# Patient Record
Sex: Female | Born: 1955 | Race: White | Hispanic: No | Marital: Married | Smoking: Former smoker
Health system: Southern US, Community
[De-identification: ages and names within clinical notes are randomized; demographics above are authoritative.]

## PROBLEM LIST (undated history)

## (undated) DIAGNOSIS — H269 Unspecified cataract: Secondary | ICD-10-CM

## (undated) DIAGNOSIS — T7840XA Allergy, unspecified, initial encounter: Secondary | ICD-10-CM

## (undated) DIAGNOSIS — M1990 Unspecified osteoarthritis, unspecified site: Secondary | ICD-10-CM

## (undated) DIAGNOSIS — R51 Headache: Secondary | ICD-10-CM

## (undated) DIAGNOSIS — I10 Essential (primary) hypertension: Secondary | ICD-10-CM

## (undated) DIAGNOSIS — F419 Anxiety disorder, unspecified: Secondary | ICD-10-CM

## (undated) DIAGNOSIS — E119 Type 2 diabetes mellitus without complications: Secondary | ICD-10-CM

## (undated) DIAGNOSIS — F32A Depression, unspecified: Secondary | ICD-10-CM

## (undated) DIAGNOSIS — G709 Myoneural disorder, unspecified: Secondary | ICD-10-CM

## (undated) DIAGNOSIS — R519 Headache, unspecified: Secondary | ICD-10-CM

## (undated) DIAGNOSIS — N200 Calculus of kidney: Secondary | ICD-10-CM

## (undated) DIAGNOSIS — K219 Gastro-esophageal reflux disease without esophagitis: Secondary | ICD-10-CM

## (undated) HISTORY — PX: EYE SURGERY: SHX253

## (undated) HISTORY — DX: Unspecified cataract: H26.9

## (undated) HISTORY — DX: Allergy, unspecified, initial encounter: T78.40XA

## (undated) HISTORY — DX: Headache: R51

## (undated) HISTORY — DX: Myoneural disorder, unspecified: G70.9

## (undated) HISTORY — DX: Unspecified osteoarthritis, unspecified site: M19.90

## (undated) HISTORY — DX: Type 2 diabetes mellitus without complications: E11.9

## (undated) HISTORY — DX: Headache, unspecified: R51.9

## (undated) HISTORY — DX: Essential (primary) hypertension: I10

## (undated) HISTORY — DX: Anxiety disorder, unspecified: F41.9

## (undated) HISTORY — DX: Depression, unspecified: F32.A

## (undated) HISTORY — DX: Calculus of kidney: N20.0

## (undated) HISTORY — DX: Gastro-esophageal reflux disease without esophagitis: K21.9

---

## 1983-09-27 HISTORY — PX: TONSILLECTOMY AND ADENOIDECTOMY: SHX28

## 1983-09-27 HISTORY — PX: TUBAL LIGATION: SHX77

## 1998-03-13 ENCOUNTER — Ambulatory Visit (HOSPITAL_COMMUNITY): Admission: RE | Admit: 1998-03-13 | Discharge: 1998-03-13 | Payer: Self-pay | Admitting: Internal Medicine

## 1998-12-22 ENCOUNTER — Encounter: Admission: RE | Admit: 1998-12-22 | Discharge: 1999-03-22 | Payer: Self-pay | Admitting: Psychiatry

## 1999-03-01 ENCOUNTER — Other Ambulatory Visit: Admission: RE | Admit: 1999-03-01 | Discharge: 1999-03-01 | Payer: Self-pay | Admitting: Gynecology

## 2003-11-18 ENCOUNTER — Other Ambulatory Visit: Admission: RE | Admit: 2003-11-18 | Discharge: 2003-11-18 | Payer: Self-pay | Admitting: Gynecology

## 2003-11-19 ENCOUNTER — Encounter: Admission: RE | Admit: 2003-11-19 | Discharge: 2003-11-19 | Payer: Self-pay | Admitting: Gynecology

## 2005-01-24 ENCOUNTER — Emergency Department (HOSPITAL_COMMUNITY): Admission: EM | Admit: 2005-01-24 | Discharge: 2005-01-24 | Payer: Self-pay | Admitting: Emergency Medicine

## 2005-02-02 ENCOUNTER — Emergency Department (HOSPITAL_COMMUNITY): Admission: EM | Admit: 2005-02-02 | Discharge: 2005-02-02 | Payer: Self-pay | Admitting: Family Medicine

## 2005-12-27 ENCOUNTER — Other Ambulatory Visit: Admission: RE | Admit: 2005-12-27 | Discharge: 2005-12-27 | Payer: Self-pay | Admitting: Gynecology

## 2006-08-11 ENCOUNTER — Encounter: Admission: RE | Admit: 2006-08-11 | Discharge: 2006-08-11 | Payer: Self-pay | Admitting: Gynecology

## 2006-08-23 ENCOUNTER — Encounter: Admission: RE | Admit: 2006-08-23 | Discharge: 2006-08-23 | Payer: Self-pay | Admitting: Gynecology

## 2010-09-26 LAB — HM COLONOSCOPY: HM Colonoscopy: NORMAL

## 2010-10-17 ENCOUNTER — Encounter: Payer: Self-pay | Admitting: Gynecology

## 2013-02-24 LAB — HM PAP SMEAR: HM Pap smear: NORMAL

## 2013-03-26 LAB — HM MAMMOGRAPHY: HM Mammogram: NORMAL

## 2013-11-19 ENCOUNTER — Ambulatory Visit: Payer: Self-pay | Admitting: Physician Assistant

## 2013-11-22 ENCOUNTER — Encounter: Payer: Self-pay | Admitting: Physician Assistant

## 2013-11-22 ENCOUNTER — Ambulatory Visit (INDEPENDENT_AMBULATORY_CARE_PROVIDER_SITE_OTHER): Payer: Managed Care, Other (non HMO) | Admitting: Physician Assistant

## 2013-11-22 VITALS — BP 170/110 | HR 89 | Temp 98.4°F | Ht 65.13 in | Wt 205.5 lb

## 2013-11-22 DIAGNOSIS — E1165 Type 2 diabetes mellitus with hyperglycemia: Secondary | ICD-10-CM | POA: Insufficient documentation

## 2013-11-22 DIAGNOSIS — E119 Type 2 diabetes mellitus without complications: Secondary | ICD-10-CM

## 2013-11-22 DIAGNOSIS — Z0000 Encounter for general adult medical examination without abnormal findings: Secondary | ICD-10-CM

## 2013-11-22 DIAGNOSIS — Z794 Long term (current) use of insulin: Secondary | ICD-10-CM | POA: Insufficient documentation

## 2013-11-22 DIAGNOSIS — I10 Essential (primary) hypertension: Secondary | ICD-10-CM | POA: Insufficient documentation

## 2013-11-22 DIAGNOSIS — E1159 Type 2 diabetes mellitus with other circulatory complications: Secondary | ICD-10-CM | POA: Insufficient documentation

## 2013-11-22 MED ORDER — LOSARTAN POTASSIUM-HCTZ 100-25 MG PO TABS: 1.0000 | ORAL_TABLET | Freq: Every day | ORAL | Status: DC

## 2013-11-22 NOTE — Progress Notes (Signed)
Pre-visit discussion using our clinic review tool. No additional management support is needed unless otherwise documented below in the visit note.  

## 2013-11-22 NOTE — Patient Instructions (Addendum)
It was great to meet you today [name]!   Labs have been ordered for you, when you report to lab please be fasting.     Health Maintenance, Female A healthy lifestyle and preventative care can promote health and wellness.  Maintain regular health, dental, and eye exams.  Eat a healthy diet. Foods like vegetables, fruits, whole grains, low-fat dairy products, and lean protein foods contain the nutrients you need without too many calories. Decrease your intake of foods high in solid fats, added sugars, and salt. Get information about a proper diet from your caregiver, if necessary.  Regular physical exercise is one of the most important things you can do for your health. Most adults should get at least 150 minutes of moderate-intensity exercise (any activity that increases your heart rate and causes you to sweat) each week. In addition, most adults need muscle-strengthening exercises on 2 or more days a week.   Maintain a healthy weight. The body mass index (BMI) is a screening tool to identify possible weight problems. It provides an estimate of body fat based on height and weight. Your caregiver can help determine your BMI, and can help you achieve or maintain a healthy weight. For adults 20 years and older:  A BMI below 18.5 is considered underweight.  A BMI of 18.5 to 24.9 is normal.  A BMI of 25 to 29.9 is considered overweight.  A BMI of 30 and above is considered obese.  Maintain normal blood lipids and cholesterol by exercising and minimizing your intake of saturated fat. Eat a balanced diet with plenty of fruits and vegetables. Blood tests for lipids and cholesterol should begin at age 29 and be repeated every 5 years. If your lipid or cholesterol levels are high, you are over 50, or you are a high risk for heart disease, you may need your cholesterol levels checked more frequently.Ongoing high lipid and cholesterol levels should be treated with medicines if diet and exercise  are not effective.  If you smoke, find out from your caregiver how to quit. If you do not use tobacco, do not start.  Lung cancer screening is recommended for adults aged 39 80 years who are at high risk for developing lung cancer because of a history of smoking. Yearly low-dose computed tomography (CT) is recommended for people who have at least a 30-pack-year history of smoking and are a current smoker or have quit within the past 15 years. A pack year of smoking is smoking an average of 1 pack of cigarettes a day for 1 year (for example: 1 pack a day for 30 years or 2 packs a day for 15 years). Yearly screening should continue until the smoker has stopped smoking for at least 15 years. Yearly screening should also be stopped for people who develop a health problem that would prevent them from having lung cancer treatment.  If you are pregnant, do not drink alcohol. If you are breastfeeding, be very cautious about drinking alcohol. If you are not pregnant and choose to drink alcohol, do not exceed 1 drink per day. One drink is considered to be 12 ounces (355 mL) of beer, 5 ounces (148 mL) of wine, or 1.5 ounces (44 mL) of liquor.  Avoid use of street drugs. Do not share needles with anyone. Ask for help if you need support or instructions about stopping the use of drugs.  High blood pressure causes heart disease and increases the risk of stroke. Blood pressure should be checked at  least every 1 to 2 years. Ongoing high blood pressure should be treated with medicines, if weight loss and exercise are not effective.  If you are 26 to 60 years old, ask your caregiver if you should take aspirin to prevent strokes.  Diabetes screening involves taking a blood sample to check your fasting blood sugar level. This should be done once every 3 years, after age 1, if you are within normal weight and without risk factors for diabetes. Testing should be considered at a younger age or be carried out more frequently  if you are overweight and have at least 1 risk factor for diabetes.  Breast cancer screening is essential preventative care for women. You should practice "breast self-awareness." This means understanding the normal appearance and feel of your breasts and may include breast self-examination. Any changes detected, no matter how small, should be reported to a caregiver. Women in their 43s and 30s should have a clinical breast exam (CBE) by a caregiver as part of a regular health exam every 1 to 3 years. After age 7, women should have a CBE every year. Starting at age 20, women should consider having a mammogram (breast X-ray) every year. Women who have a family history of breast cancer should talk to their caregiver about genetic screening. Women at a high risk of breast cancer should talk to their caregiver about having an MRI and a mammogram every year.  Breast cancer gene (BRCA)-related cancer risk assessment is recommended for women who have family members with BRCA-related cancers. BRCA-related cancers include breast, ovarian, tubal, and peritoneal cancers. Having family members with these cancers may be associated with an increased risk for harmful changes (mutations) in the breast cancer genes BRCA1 and BRCA2. Results of the assessment will determine the need for genetic counseling and BRCA1 and BRCA2 testing.  The Pap test is a screening test for cervical cancer. Women should have a Pap test starting at age 52. Between ages 50 and 2, Pap tests should be repeated every 2 years. Beginning at age 70, you should have a Pap test every 3 years as long as the past 3 Pap tests have been normal. If you had a hysterectomy for a problem that was not cancer or a condition that could lead to cancer, then you no longer need Pap tests. If you are between ages 40 and 10, and you have had normal Pap tests going back 10 years, you no longer need Pap tests. If you have had past treatment for cervical cancer or a  condition that could lead to cancer, you need Pap tests and screening for cancer for at least 20 years after your treatment. If Pap tests have been discontinued, risk factors (such as a new sexual partner) need to be reassessed to determine if screening should be resumed. Some women have medical problems that increase the chance of getting cervical cancer. In these cases, your caregiver may recommend more frequent screening and Pap tests.  The human papillomavirus (HPV) test is an additional test that may be used for cervical cancer screening. The HPV test looks for the virus that can cause the cell changes on the cervix. The cells collected during the Pap test can be tested for HPV. The HPV test could be used to screen women aged 34 years and older, and should be used in women of any age who have unclear Pap test results. After the age of 40, women should have HPV testing at the same frequency as a Pap  test.  Colorectal cancer can be detected and often prevented. Most routine colorectal cancer screening begins at the age of 36 and continues through age 26. However, your caregiver may recommend screening at an earlier age if you have risk factors for colon cancer. On a yearly basis, your caregiver may provide home test kits to check for hidden blood in the stool. Use of a small camera at the end of a tube, to directly examine the colon (sigmoidoscopy or colonoscopy), can detect the earliest forms of colorectal cancer. Talk to your caregiver about this at age 66, when routine screening begins. Direct examination of the colon should be repeated every 5 to 10 years through age 36, unless early forms of pre-cancerous polyps or small growths are found.  Hepatitis C blood testing is recommended for all people born from [birth year] through 1967 and any individual with known risks for hepatitis C.  Practice safe sex. Use condoms and avoid high-risk sexual practices to reduce the spread of sexually transmitted infections  (STIs). Sexually active women aged 32 and younger should be checked for Chlamydia, which is a common sexually transmitted infection. Older women with new or multiple partners should also be tested for Chlamydia. Testing for other STIs is recommended if you are sexually active and at increased risk.  Osteoporosis is a disease in which the bones lose minerals and strength with aging. This can result in serious bone fractures. The risk of osteoporosis can be identified using a bone density scan. Women ages 67 and over and women at risk for fractures or osteoporosis should discuss screening with their caregivers. Ask your caregiver whether you should be taking a calcium supplement or vitamin D to reduce the rate of osteoporosis.  Menopause can be associated with physical symptoms and risks. Hormone replacement therapy is available to decrease symptoms and risks. You should talk to your caregiver about whether hormone replacement therapy is right for you.  Use sunscreen. Apply sunscreen liberally and repeatedly throughout the day. You should seek shade when your shadow is shorter than you. Protect yourself by wearing long sleeves, pants, a wide-brimmed hat, and sunglasses year round, whenever you are outdoors.  Notify your caregiver of new moles or changes in moles, especially if there is a change in shape or color. Also notify your caregiver if a mole is larger than the size of a pencil eraser.  Stay current with your immunizations. Document Released: 03/28/2011 Document Revised: 01/07/2013 Document Reviewed: 03/28/2011 [facility] Patient Information 2014 [place].   Diabetes and Exercise Exercising regularly is important. It is not just about losing weight. It has many health benefits, such as:  Improving your overall fitness, flexibility, and endurance.  Increasing your bone density.  Helping with weight control.  Decreasing your body fat.  Increasing your muscle strength.  Reducing  stress and tension.  Improving your overall health. People with diabetes who exercise gain additional benefits because exercise:  Reduces appetite.  Improves the body's use of blood sugar (glucose).  Helps lower or control blood glucose.  Decreases blood pressure.  Helps control blood lipids (such as cholesterol and triglycerides).  Improves the body's use of the hormone insulin by:  Increasing the body's insulin sensitivity.  Reducing the body's insulin needs.  Decreases the risk for heart disease because exercising:  Lowers cholesterol and triglycerides levels.  Increases the levels of good cholesterol (such as high-density lipoproteins [HDL]) in the body.  Lowers blood glucose levels. YOUR ACTIVITY PLAN  Choose an activity that you enjoy  and set realistic goals. Your health care provider or diabetes educator can help you make an activity plan that works for you. You can break activities into 2 or 3 sessions throughout the day. Doing so is as good as one long session. Exercise ideas include:  Taking the dog for a walk.  Taking the stairs instead of the elevator.  Dancing to your favorite song.  Doing your favorite exercise with a friend. RECOMMENDATIONS FOR EXERCISING WITH TYPE 1 OR TYPE 2 DIABETES   Check your blood glucose before exercising. If blood glucose levels are greater than 240 mg/dL, check for urine ketones. Do not exercise if ketones are present.  Avoid injecting insulin into areas of the body that are going to be exercised. For example, avoid injecting insulin into:  The arms when playing tennis.  The legs when jogging.  Keep a record of:  Food intake before and after you exercise.  Expected peak times of insulin action.  Blood glucose levels before and after you exercise.  The type and amount of exercise you have done.  Review your records with your health care provider. Your health care provider will help you to develop guidelines for  adjusting food intake and insulin amounts before and after exercising.  If you take insulin or oral hypoglycemic agents, watch for signs and symptoms of hypoglycemia. They include:  Dizziness.  Shaking.  Sweating.  Chills.  Confusion.  Drink plenty of water while you exercise to prevent dehydration or heat stroke. Body water is lost during exercise and must be replaced.  Talk to your health care provider before starting an exercise program to make sure it is safe for you. Remember, almost any type of activity is better than none. Document Released: 12/03/2003 Document Revised: 05/15/2013 Document Reviewed: 02/19/2013 [facility] Patient Information 2014 [place].    DASH Diet The DASH diet stands for "Dietary Approaches to Stop Hypertension." It is a healthy eating plan that has been shown to reduce high blood pressure (hypertension) in as little as 14 days, while also possibly providing other significant health benefits. These other health benefits include reducing the risk of breast cancer after menopause and reducing the risk of type 2 diabetes, heart disease, colon cancer, and stroke. Health benefits also include weight loss and slowing kidney failure in patients with chronic kidney disease.  DIET GUIDELINES  Limit salt (sodium). Your diet should contain less than 1500 mg of sodium daily.  Limit refined or processed carbohydrates. Your diet should include mostly whole grains. Desserts and added sugars should be used sparingly.  Include small amounts of heart-healthy fats. These types of fats include nuts, oils, and tub margarine. Limit saturated and trans fats. These fats have been shown to be harmful in the body. CHOOSING FOODS  The following food groups are based on a 2000 calorie diet. See your Registered Dietitian for individual calorie needs. Grains and Grain Products (6 to 8 servings daily)  Eat More Often: Whole-wheat bread, brown rice, whole-grain or wheat pasta,  quinoa, popcorn without added fat or salt (air popped).  Eat Less Often: White bread, white pasta, white rice, cornbread. Vegetables (4 to 5 servings daily)  Eat More Often: Fresh, frozen, and canned vegetables. Vegetables may be raw, steamed, roasted, or grilled with a minimal amount of fat.  Eat Less Often/Avoid: Creamed or fried vegetables. Vegetables in a cheese sauce. Fruit (4 to 5 servings daily)  Eat More Often: All fresh, canned (in natural juice), or frozen fruits. Dried fruits without  added sugar. One hundred percent fruit juice ( cup [237 mL] daily).  Eat Less Often: Dried fruits with added sugar. Canned fruit in light or heavy syrup. [employer], Fish, and Poultry (2 servings or less daily. One serving is 3 to 4 oz [85-114 g]).  Eat More Often: Ninety percent or leaner ground beef, tenderloin, sirloin. Round cuts of beef, chicken breast, [place] breast. All fish. Grill, bake, or broil your meat. Nothing should be fried.  Eat Less Often/Avoid: Fatty cuts of meat, [place], or chicken leg, thigh, or wing. Fried cuts of meat or fish. Dairy (2 to 3 servings)  Eat More Often: Low-fat or fat-free milk, low-fat plain or light yogurt, reduced-fat or part-skim cheese.  Eat Less Often/Avoid: Milk (whole, 2%).Whole milk yogurt. Full-fat cheeses. Nuts, Seeds, and Legumes (4 to 5 servings per week)  Eat More Often: All without added salt.  Eat Less Often/Avoid: Salted nuts and seeds, canned beans with added salt. Fats and Sweets (limited)  Eat More Often: Vegetable oils, tub margarines without trans fats, sugar-free gelatin. Mayonnaise and salad dressings.  Eat Less Often/Avoid: Coconut oils, palm oils, butter, stick margarine, cream, half and half, cookies, candy, pie. FOR MORE INFORMATION The Dash Diet Eating Plan: www.dashdiet.org Document Released: 09/01/2011 Document Revised: 12/05/2011 Document Reviewed: 09/01/2011 [facility] Patient Information 2014 [place],  [place].    Hypertension Hypertension is another name for high blood pressure. High blood pressure may mean that your heart needs to work harder to pump blood. Blood pressure consists of two numbers, which includes a higher number over a lower number (example: 110/72). HOME CARE   Make lifestyle changes as told by your doctor. This may include weight loss and exercise.  Take your blood pressure medicine every day.  Limit how much salt you use.  Stop smoking if you smoke.  Do not use drugs.  Talk to your doctor if you are using decongestants or birth control pills. These medicines might make blood pressure higher.  Females should not drink more than 1 alcoholic drink per day. Males should not drink more than 2 alcoholic drinks per day.  See your doctor as told. GET HELP RIGHT AWAY IF:   You have a blood pressure reading with a top number of 180 or higher.  You get a very bad headache.  You get blurred or changing vision.  You feel confused.  You feel weak, numb, or faint.  You get chest or belly (abdominal) pain.  You throw up (vomit).  You cannot breathe very well. MAKE SURE YOU:   Understand these instructions.  Will watch your condition.  Will get help right away if you are not doing well or get worse. Document Released: 02/29/2008 Document Revised: 12/05/2011 Document Reviewed: 02/29/2008 [facility] Patient Information 2014 [place], [place].

## 2013-11-22 NOTE — Progress Notes (Signed)
Patient ID: Rebecca Cook is a 58 y.o. female DOB: May 11, 2056 MRN: [id number]     HPI:  Patient is a 58 year old female who presents to the office to establish care and have complete physical exam. Recently relocated back to the area after living in [place] for a number of years. States while living in [place] was once diagnosed with DM and placed on metformin 500 mg twice daily, reports worked really hard at correcting diet and exercising and was eventually able to stop medications. Reports move back to this area due to divorce, loosing her job and loosing her home, moved in with adult son and has not taken care of herself. Went to a clinic this past summer to have GYN exam with PAP and mammography. Recently diagnosed with HTN at clinic and placed on Losartan/HCTZ 100/25 mg with good control of bp, normal readings 118-120/80. Has been out of medication for the last week. Has new job with [employer], with insurance, and clinic would not refill her medication telling her she needed to get established with PCP. Denies other concerns at this time. Denies chest pain/palpitations, SOB, cough, extremity swelling, change in bowel/bladder habits, visual disturbances, N/V/F/C, abdominal pain, pain/difficulty swallowing, lightheaded, dizzy or weakness. OTC medications include Excedrin migraine for HA, Yeast guard for yeast infections, Multivitamin and Claritin for allergy relief.   Influenza: no Pneumonia: uncertain of date Tetanus: uncertain of date PAP: 02/2013 LMP: post menopausal Mammogram:6/14 Eye Dr. Hazel Sams to schedule Colonoscopy: 2012   ROS: As stated in HPI. All other systems negative  Past Medical History  Diagnosis Date  . Frequent headaches   . Diabetes mellitus, type II   . GERD (gastroesophageal reflux disease)   . Hypertension   . Kidney stones     None current, last had kidney stones in 1986   Family History  Problem Relation Age of Onset  . Cancer Mother     Lung  . Heart disease  Father   . Heart disease Paternal Grandmother     Thyroid  . Cancer - Other Brother    History   Social History  . Marital Status: Divorced    Spouse Name: N/A    Number of Children: 2  . Years of Education: College   Occupational History  .  [employer]   Social History Main Topics  . Smoking status: Former Research scientist (life sciences)  . Smokeless tobacco: Never Used  . Alcohol Use: Yes  . Drug Use: No  . Sexual Activity: Not Currently   Other Topics Concern  . None   Social History Narrative  . None   History reviewed. No pertinent past surgical history. No current outpatient prescriptions on file prior to visit.   No current facility-administered medications on file prior to visit.   No Known Allergies  PE: CONSTITUTIONAL: Well developed, well nourished, pleasant, appears stated age, in NAD HEENT: normocephalic, atraumatic, bilateral ext/int canals normal. Bilateral TM's without injections, bulging, erythema. Nose normal, uvula midline, oropharynx clear and moist. EYES: PERRLA, bilateral EOM and conjunctiva normal, no icterus NECK: FROM, supple, without thyromegaly or mass, no carotid bruits CARDIO: RRR, no m/r/g, normal S1 and S2, distal pulses intact. PULM/CHEST CTA bilateral, no wheezes, rales or rhonchi. Non tender. ABD: appearance normal, soft, nontender. Normal bowel sounds x 4 quadrants, nonpalpable spleen, liver, kidneys. GU: deferred to GYN MUSC: FROM U/LE bilateral, thoracic and lumbar spine with FROM. DM foot exam completed. LYMPH: no cervical, supraclavicular adenopathy NEURO: alert and oriented x  3, no cranial nerve deficit, motor strength and coordination NL. DTR's intact. Negative romberg. Gait normal. SKIN: warm, dry, no rash or lesions noted. PSYCH: Mood and affect normal, speech normal.     ASSESSMENT and PLAN   CPX/v70.0 - Patient has been counseled on age-appropriate routine health concerns for screening and prevention. These are reviewed and  up-to-date. Immunizations are up-to-date or declined. Labs ordered and will be reviewed.  Patient to sign records release.  HM referr to GYN, Ophthalmology   HTN: Elevated today, my read was 150/90 Rx for Losartan HCTZ 100/25 mg, one tab daily   DM: Will not start medication until labs resulted.  DM foot exam done today. HgA1c today.

## 2013-11-23 NOTE — Assessment & Plan Note (Signed)
Elevated today, my read was 150/90 Rx for Losartan HCTZ 100/25 mg, one tab daily

## 2013-11-23 NOTE — Assessment & Plan Note (Signed)
Will not start medication until labs resulted.  DM foot exam done today. HgA1c today. Past medication Metformin 500 mg bid

## 2013-11-25 ENCOUNTER — Other Ambulatory Visit (INDEPENDENT_AMBULATORY_CARE_PROVIDER_SITE_OTHER): Payer: Managed Care, Other (non HMO)

## 2013-11-25 DIAGNOSIS — E119 Type 2 diabetes mellitus without complications: Secondary | ICD-10-CM

## 2013-11-25 DIAGNOSIS — Z0000 Encounter for general adult medical examination without abnormal findings: Secondary | ICD-10-CM

## 2013-11-25 DIAGNOSIS — I10 Essential (primary) hypertension: Secondary | ICD-10-CM

## 2013-11-25 LAB — LIPID PANEL
Cholesterol: 248 mg/dL — ABNORMAL HIGH (ref 0–200)
HDL: 38.6 mg/dL — ABNORMAL LOW (ref >39.00)
LDL Cholesterol: 155 mg/dL — ABNORMAL HIGH (ref 0–99)
Total CHOL/HDL Ratio: 6
Triglycerides: 274 mg/dL — ABNORMAL HIGH (ref 0.0–149.0)
VLDL: 54.8 mg/dL — ABNORMAL HIGH (ref 0.0–40.0)

## 2013-11-25 LAB — BASIC METABOLIC PANEL
BUN: 20 mg/dL (ref 6–23)
CO2: 29 mEq/L (ref 19–32)
Calcium: 9.8 mg/dL (ref 8.4–10.5)
Chloride: 97 mEq/L (ref 96–112)
Creatinine, Ser: 0.8 mg/dL (ref 0.4–1.2)
GFR: 81.5 mL/min (ref >60.00)
Glucose, Bld: 358 mg/dL — ABNORMAL HIGH (ref 70–99)
Potassium: 5 mEq/L (ref 3.5–5.1)
Sodium: 135 mEq/L (ref 135–145)

## 2013-11-25 LAB — URINALYSIS, ROUTINE W REFLEX MICROSCOPIC
Bilirubin Urine: NEGATIVE
Hgb urine dipstick: NEGATIVE
Ketones, ur: NEGATIVE
Leukocytes, UA: NEGATIVE
Nitrite: NEGATIVE
Specific Gravity, Urine: 1.025 (ref 1.000–1.030)
Total Protein, Urine: NEGATIVE
Urine Glucose: >=1000 — AB
Urobilinogen, UA: 0.2 (ref 0.0–1.0)
pH: 5.5 (ref 5.0–8.0)

## 2013-11-25 LAB — CBC WITH DIFFERENTIAL/PLATELET
Basophils Absolute: 0 10*3/uL (ref 0.0–0.1)
Basophils Relative: 0.6 % (ref 0.0–3.0)
Eosinophils Absolute: 0.3 10*3/uL (ref 0.0–0.7)
Eosinophils Relative: 3.4 % (ref 0.0–5.0)
HCT: 47.4 % — ABNORMAL HIGH (ref 36.0–46.0)
Hemoglobin: 15.7 g/dL — ABNORMAL HIGH (ref 12.0–15.0)
Lymphocytes Relative: 28.9 % (ref 12.0–46.0)
Lymphs Abs: 2.3 10*3/uL (ref 0.7–4.0)
MCHC: 33.1 g/dL (ref 30.0–36.0)
MCV: 85.1 fl (ref 78.0–100.0)
Monocytes Absolute: 0.6 10*3/uL (ref 0.1–1.0)
Monocytes Relative: 7 % (ref 3.0–12.0)
Neutro Abs: 4.8 10*3/uL (ref 1.4–7.7)
Neutrophils Relative %: 60.1 % (ref 43.0–77.0)
Platelets: 236 10*3/uL (ref 150.0–400.0)
RBC: 5.58 Mil/uL — ABNORMAL HIGH (ref 3.87–5.11)
RDW: 14.1 % (ref 11.5–14.6)
WBC: 7.9 10*3/uL (ref 4.5–10.5)

## 2013-11-25 LAB — HEPATIC FUNCTION PANEL
ALT: 55 U/L — ABNORMAL HIGH (ref 0–35)
AST: 37 U/L (ref 0–37)
Albumin: 4 g/dL (ref 3.5–5.2)
Alkaline Phosphatase: 87 U/L (ref 39–117)
Bilirubin, Direct: 0.1 mg/dL (ref 0.0–0.3)
Total Bilirubin: 0.8 mg/dL (ref 0.3–1.2)
Total Protein: 7.8 g/dL (ref 6.0–8.3)

## 2013-11-25 LAB — TSH: TSH: 1.69 u[IU]/mL (ref 0.35–5.50)

## 2013-11-25 LAB — HEMOGLOBIN A1C: Hgb A1c MFr Bld: 11.4 % — ABNORMAL HIGH (ref 4.6–6.5)

## 2013-11-27 ENCOUNTER — Other Ambulatory Visit: Payer: Self-pay | Admitting: Physician Assistant

## 2013-11-27 DIAGNOSIS — E119 Type 2 diabetes mellitus without complications: Secondary | ICD-10-CM

## 2013-11-27 DIAGNOSIS — E785 Hyperlipidemia, unspecified: Secondary | ICD-10-CM

## 2013-11-27 MED ORDER — GLUCOSE BLOOD VI STRP: ORAL_STRIP | Status: DC

## 2013-11-27 MED ORDER — ATORVASTATIN CALCIUM 40 MG PO TABS: 40.0000 mg | ORAL_TABLET | Freq: Every day | ORAL | Status: DC

## 2013-11-27 MED ORDER — CANAGLIFLOZIN 300 MG PO TABS: 1.0000 | ORAL_TABLET | Freq: Once | ORAL | Status: DC

## 2013-11-27 MED ORDER — FREESTYLE SYSTEM KIT: 1.0000 | PACK | Status: DC | PRN

## 2013-11-27 MED ORDER — SITAGLIPTIN PHOS-METFORMIN HCL 50-1000 MG PO TABS: 1.0000 | ORAL_TABLET | Freq: Two times a day (BID) | ORAL | Status: DC

## 2013-11-27 MED ORDER — FREESTYLE LANCETS MISC: Status: DC

## 2013-12-03 ENCOUNTER — Telehealth: Payer: Self-pay | Admitting: *Deleted

## 2013-12-03 NOTE — Telephone Encounter (Signed)
Patient phoned stating that one of the two recently prescribed meds (thinks its the Janumet) has made her nauseous since starting it and the nausea is worsening.  Thinks it is the Janumet b/c she has had problems with metformin in the past giving her GI sxs.  Please advise.  CB# [phone]

## 2013-12-04 MED ORDER — SITAGLIP PHOS-METFORMIN HCL ER 50-1000 MG PO TB24: 1.0000 | ORAL_TABLET | Freq: Two times a day (BID) | ORAL | Status: DC

## 2013-12-04 NOTE — Telephone Encounter (Signed)
Patient has phoned again this morning, please review & advise.

## 2013-12-04 NOTE — Telephone Encounter (Signed)
Ok to give sample of XR and change Rx to XR as well.

## 2013-12-04 NOTE — Telephone Encounter (Signed)
Changed med per order; d/c'ed previous janumet and notified patient sample available for p/u.

## 2013-12-04 NOTE — Telephone Encounter (Signed)
Please phone patient and clarify if she wants off the metformin altogether or would like to try an extended release formula? Approximately 50 % of patient experience improvement with the GI side effects. If she wants off all together will have to change prescriptions.

## 2013-12-04 NOTE — Telephone Encounter (Signed)
Patient states she would like to try the ER formula.  Please advise.

## 2013-12-25 ENCOUNTER — Ambulatory Visit: Payer: Managed Care, Other (non HMO) | Admitting: Physician Assistant

## 2013-12-25 ENCOUNTER — Ambulatory Visit: Payer: Managed Care, Other (non HMO) | Admitting: Internal Medicine

## 2013-12-25 ENCOUNTER — Encounter: Payer: Self-pay | Admitting: Internal Medicine

## 2013-12-25 ENCOUNTER — Ambulatory Visit (INDEPENDENT_AMBULATORY_CARE_PROVIDER_SITE_OTHER): Payer: Managed Care, Other (non HMO) | Admitting: Internal Medicine

## 2013-12-25 VITALS — BP 132/92 | HR 94 | Temp 97.9°F | Resp 14 | Wt 201.2 lb

## 2013-12-25 DIAGNOSIS — F4323 Adjustment disorder with mixed anxiety and depressed mood: Secondary | ICD-10-CM

## 2013-12-25 DIAGNOSIS — IMO0002 Reserved for concepts with insufficient information to code with codable children: Secondary | ICD-10-CM

## 2013-12-25 DIAGNOSIS — E1165 Type 2 diabetes mellitus with hyperglycemia: Principal | ICD-10-CM

## 2013-12-25 DIAGNOSIS — M5414 Radiculopathy, thoracic region: Secondary | ICD-10-CM

## 2013-12-25 DIAGNOSIS — IMO0001 Reserved for inherently not codable concepts without codable children: Secondary | ICD-10-CM

## 2013-12-25 MED ORDER — CANAGLIFLOZIN 300 MG PO TABS: 1.0000 | ORAL_TABLET | Freq: Once | ORAL | Status: DC

## 2013-12-25 MED ORDER — SITAGLIP PHOS-METFORMIN HCL ER 50-1000 MG PO TB24: 1.0000 | ORAL_TABLET | Freq: Two times a day (BID) | ORAL | Status: DC

## 2013-12-25 MED ORDER — GABAPENTIN 100 MG PO CAPS: ORAL_CAPSULE | ORAL | Status: DC

## 2013-12-25 MED ORDER — CITALOPRAM HYDROBROMIDE 20 MG PO TABS: 20.0000 mg | ORAL_TABLET | Freq: Every day | ORAL | Status: DC

## 2013-12-25 NOTE — Patient Instructions (Addendum)
Assess response to the gabapentin one every 8 hours as needed. If it is partially beneficial, it can be increased up to a total of 3 pills every 8 hours as needed. This increase of 1 pill each dose  should take place over 72 hours at least. Please consider taking the agent to raise the neurotransmitters which are essential for good brain function, both intellectual & emotional health. These agents are not addictive and simply keep this essential neurotransmitter at therapeutic levels. If these levels become severely depleted; depression or panic attacks can occur.   The best exercises for the low back include freestyle swimming, stretch aerobics, and yoga.Cybex & Nautilus machines rather than dead weights are better for the back. Follow a low carb nutrition program such as  The New Sugar Busters as closely as possible to prevent Diabetes progression & complications.  White carbohydrates (potatoes, rice, bread, and pasta) cause a high spike of the sugar level which stays elevated for a significant period of time (called sugar"load").  For example a  baked potato has a cup of sugar and a  french fry  2 teaspoons of sugar.  More complex carbs such as yams, wild  rice, whole grained bread &  wheat pasta have been much lower spike and persistent load of sugar than the white carbs. The pancreas excretes excess insulin in response to the high spike & load of sugar . Over time the pancreas can actually run out of insulin necessitating insulin shots.Cardiovascular exercise, this can be as simple a program as walking, is recommended 30-45 minutes 3-4 times per week. If you're not exercising you should take 6-8 weeks to build up to this level.

## 2013-12-25 NOTE — Progress Notes (Signed)
   Subjective:    Patient ID: Rebecca Cook, female    DOB: 1956-01-08, 58 y.o.   MRN: [id number]  HPI  Rebecca Cook is a dramatic and extremely sad story. She  moved from [place] to help her son in what she describes as a "nasty" custody battle with his ex-wife. This has resulted in extreme financial stress with loss of her house and car.She & her son live together in an apartment and "making ends meet is difficult".  Remotely she was placed on Wellbutrin but was intolerant to this. She was worried about possible addictive property of the medicine.  She is also depressed about her recent diagnosis of uncontrolled diabetes. She has initiated the medication therapy but is running out of her meds     Review of Systems  For several months she's had some right mid posterior thoracic  discomfort. This is not associated with any specific injury. She does lift her grandchildren repeatedly.  There's been no associated weight loss, fever, chills, or sweats. There's been no change in color or temperature in the area of the discomfort. There's been no associated rash       Objective:   Physical Exam General appearance is one of good health and nourishment w/o distress.  Eyes: No conjunctival inflammation or scleral icterus is present.  Oral exam: Dental hygiene is good; lips and gums are healthy appearing.There is no oropharyngeal erythema or exudate noted.   Heart:  Normal rate and regular rhythm. S1 and S2 normal without gallop, murmur, click, rub or other extra sounds     Lungs:Chest clear to auscultation; no wheezes, rhonchi,rales ,or rubs present.No increased work of breathing.   Abdomen: bowel sounds normal, soft and non-tender without masses, organomegaly or hernias noted.  No guarding or rebound . No tenderness over the flanks to percussion  Musculoskeletal: Able to lie flat and sit up without help. Negative straight leg raising bilaterally. Gait normal. Strength and tone are normal  extremities.  Neuro: Oriented x3. Deep tendon reflexes are normal. No neurologic deficit present in the upper extremities.  Skin:Warm & dry.  Intact without suspicious lesions or rashes ; no jaundice or tenting  Lymphatic: No lymphadenopathy is noted about the head, neck, axilla  Psych: She is open and communicative. She is realistic and she describes her situation. She did become tearful describing her son's situation. She denies suicidal ideation.                Assessment & Plan:  #1 situational anxiety with appropriate emotional response. 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 Generic citalopram recommended  #2 thoracic radicular pain. This is most likely related to repetitive lifting her grandchildren. Diabetic neuropathy is not suggested  #3 uncontrolled diabetes. Dietary interventions discussed. Samples provided of her diabetic medications

## 2013-12-25 NOTE — Progress Notes (Signed)
Pre visit review using our clinic review tool, if applicable. No additional management support is needed unless otherwise documented below in the visit note. 

## 2014-01-03 ENCOUNTER — Ambulatory Visit: Payer: Managed Care, Other (non HMO) | Admitting: Internal Medicine

## 2014-01-10 ENCOUNTER — Ambulatory Visit: Payer: Managed Care, Other (non HMO) | Admitting: Internal Medicine

## 2014-01-31 ENCOUNTER — Ambulatory Visit (INDEPENDENT_AMBULATORY_CARE_PROVIDER_SITE_OTHER): Payer: Managed Care, Other (non HMO) | Admitting: Internal Medicine

## 2014-01-31 ENCOUNTER — Ambulatory Visit (INDEPENDENT_AMBULATORY_CARE_PROVIDER_SITE_OTHER): Payer: Managed Care, Other (non HMO) | Admitting: Obstetrics & Gynecology

## 2014-01-31 ENCOUNTER — Encounter: Payer: Self-pay | Admitting: Obstetrics & Gynecology

## 2014-01-31 ENCOUNTER — Encounter: Payer: Self-pay | Admitting: Internal Medicine

## 2014-01-31 VITALS — BP 127/83 | HR 93 | Temp 98.3°F | Ht 68.0 in | Wt 190.1 lb

## 2014-01-31 VITALS — BP 118/84 | HR 83 | Temp 99.5°F | Resp 14 | Wt 190.0 lb

## 2014-01-31 DIAGNOSIS — IMO0001 Reserved for inherently not codable concepts without codable children: Secondary | ICD-10-CM

## 2014-01-31 DIAGNOSIS — Z01419 Encounter for gynecological examination (general) (routine) without abnormal findings: Secondary | ICD-10-CM

## 2014-01-31 DIAGNOSIS — E1165 Type 2 diabetes mellitus with hyperglycemia: Principal | ICD-10-CM

## 2014-01-31 DIAGNOSIS — Z0000 Encounter for general adult medical examination without abnormal findings: Secondary | ICD-10-CM

## 2014-01-31 DIAGNOSIS — B3731 Acute candidiasis of vulva and vagina: Secondary | ICD-10-CM

## 2014-01-31 DIAGNOSIS — B373 Candidiasis of vulva and vagina: Secondary | ICD-10-CM

## 2014-01-31 DIAGNOSIS — D2330 Other benign neoplasm of skin of unspecified part of face: Secondary | ICD-10-CM

## 2014-01-31 DIAGNOSIS — D2230 Melanocytic nevi of unspecified part of face: Secondary | ICD-10-CM

## 2014-01-31 DIAGNOSIS — Z1231 Encounter for screening mammogram for malignant neoplasm of breast: Secondary | ICD-10-CM

## 2014-01-31 MED ORDER — FLUCONAZOLE 150 MG PO TABS: 150.0000 mg | ORAL_TABLET | Freq: Once | ORAL | Status: DC

## 2014-01-31 NOTE — Patient Instructions (Signed)
Monilial Vaginitis  Vaginitis in a soreness, swelling and redness (inflammation) of the vagina and vulva. Monilial vaginitis is not a sexually transmitted infection.  CAUSES   Yeast vaginitis is caused by yeast (candida) that is normally found in your vagina. With a yeast infection, the candida has overgrown in number to a point that upsets the chemical balance.  SYMPTOMS   · White, thick vaginal discharge.  · Swelling, itching, redness and irritation of the vagina and possibly the lips of the vagina (vulva).  · Burning or painful urination.  · Painful intercourse.  DIAGNOSIS   Things that may contribute to monilial vaginitis are:  · Postmenopausal and virginal states.  · Pregnancy.  · Infections.  · Being tired, sick or stressed, especially if you had monilial vaginitis in the past.  · Diabetes. Good control will help lower the chance.  · Birth control pills.  · Tight fitting garments.  · Using bubble bath, feminine sprays, douches or deodorant tampons.  · Taking certain medications that kill germs (antibiotics).  · Sporadic recurrence can occur if you become ill.  TREATMENT   Your caregiver will give you medication.  · There are several kinds of anti monilial vaginal creams and suppositories specific for monilial vaginitis. For recurrent yeast infections, use a suppository or cream in the vagina 2 times a week, or as directed.  · Anti-monilial or steroid cream for the itching or irritation of the vulva may also be used. Get your caregiver's permission.  · Painting the vagina with methylene blue solution may help if the monilial cream does not work.  · Eating yogurt may help prevent monilial vaginitis.  HOME CARE INSTRUCTIONS   · Finish all medication as prescribed.  · Do not have sex until treatment is completed or after your caregiver tells you it is okay.  · Take warm sitz baths.  · Do not douche.  · Do not use tampons, especially scented ones.  · Wear cotton underwear.  · Avoid tight pants and panty  hose.  · Tell your sexual partner that you have a yeast infection. They should go to their caregiver if they have symptoms such as mild rash or itching.  · Your sexual partner should be treated as well if your infection is difficult to eliminate.  · Practice safer sex. Use condoms.  · Some vaginal medications cause latex condoms to fail. Vaginal medications that harm condoms are:  · Cleocin cream.  · Butoconazole (Femstat®).  · Terconazole (Terazol®) vaginal suppository.  · Miconazole (Monistat®) (may be purchased over the counter).  SEEK MEDICAL CARE IF:   · You have a temperature by mouth above 102° F (38.9° C).  · The infection is getting worse after 2 days of treatment.  · The infection is not getting better after 3 days of treatment.  · You develop blisters in or around your vagina.  · You develop vaginal bleeding, and it is not your menstrual period.  · You have pain when you urinate.  · You develop intestinal problems.  · You have pain with sexual intercourse.  Document Released: 06/22/2005 Document Revised: 12/05/2011 Document Reviewed: 03/06/2009  [employer]® Patient Information ©2014 [employer].

## 2014-01-31 NOTE — Patient Instructions (Addendum)
Consume less than 30 grams of sugar per day from foods & drinks with High Fructose Corn Sugar as #1,2,3 or # 4 on label. Follow a low carb nutrition program such as  The New Sugar Busters as closely as possible to prevent Diabetes progression & complications.  White carbohydrates (potatoes, rice, bread, and pasta) cause a high spike of the sugar level which stays elevated for a significant period of time (called sugar"load").  For example a  baked potato has a cup of sugar and a  french fry  2 teaspoons of sugar.  More complex carbs such as yams, wild  rice, whole grained bread &  wheat pasta have been much lower spike and persistent load of sugar than the white carbs. The pancreas excretes excess insulin in response to the high spike & load of sugar . Over time the pancreas can actually run out of insulin necessitating insulin shots. Check glucose once daily if possible Fasting or morning glucose recommended M, W, F, & Sun if possible. Goal= 100-150 Glucose 2 hours after breakfast Tues, after lunch Thurs & 2 hrs after eve meal Sat if possible. Goal = < 180 Use your cell phone camera to monitor the facial skin lesion (nevi). Take a photo of the skin lesions every 3 months with a small ruler immediately below the lesion to define any change in size, shape or color as we discussed.

## 2014-01-31 NOTE — Progress Notes (Signed)
Pre visit review using our clinic review tool, if applicable. No additional management support is needed unless otherwise documented below in the visit note. 

## 2014-01-31 NOTE — Progress Notes (Signed)
Subjective:    Patient ID: Rebecca Cook, female    DOB: 1956-06-11, 58 y.o.   MRN: [id number]  HPI Diabetes status assessment: Fasting or morning glucose average is 140. Highest glucose 2 hours after any meal is 280 (after OJ to excess , drunk while recovering from flu). No hypoglycemia reported                                                                                                                 Regular exercise described as  Walking & yoga. Heart healthy nutrition/diet followed Medication compliance is good. No medication adverse effects noted. Eye exam current (2 weeks ago). Foot care not current  A1c/ urine microalbumin monitor  11.4 on 11/25/13    Review of Systems  Polydipsia.No  excess hunger ; or excess urination reported                              No lightheadedness with standing reported. Tinnitus is chronic issue. No chest pain ; palpitations ; claudication described .                                                                                                                             No non healing skin  ulcers or sores of extremities noted. No numbness or tingling or burning in feet described                                                                                                                                             No significant change in weight . No blurred,double, or loss of vision reported  .            Objective:   Physical Exam  Gen.: Healthy and well-nourished in appearance.Slight weight excess. Alert, appropriate and cooperative throughout  exam.Very motivated & focused ! Appears younger than stated age  Head: Normocephalic without obvious abnormalities  Eyes: No corneal or conjunctival inflammation noted. Pupils equal round reactive to light and accommodation. Nose: External nasal exam reveals no deformity or inflammation. Nasal mucosa are pink and moist. No lesions or exudates noted.   Mouth: Oral mucosa and oropharynx  reveal no lesions or exudates. Teeth in good repair. Neck: No deformities, masses, or tenderness noted. Thyroid normal. Lungs: Normal respiratory effort; chest expands symmetrically. Lungs are clear to auscultation without rales, wheezes, or increased work of breathing. Heart: Normal rate and rhythm. Normal S1 and S2. No gallop, click, or rub. No murmur. Abdomen: Bowel sounds normal; abdomen soft and nontender. No masses, organomegaly or hernias noted.                                Musculoskeletal/extremities: No deformity or scoliosis noted of  the thoracic or lumbar spine.  No clubbing, cyanosis, edema, or significant extremity  deformity noted. Range of motion normal .Tone & strength normal. Hand joints normal Fingernail health good. Able to lie down & sit up w/o help.  Vascular: Carotid, radial artery, dorsalis pedis and  posterior tibial pulses are full and equal. No bruits present. Neurologic: Alert and oriented x3. Deep tendon reflexes symmetrical and normal.  Gait normal   Skin: Intact without  Rashes.Irregular nevus R malar area, stable by hx Lymph: No cervical, axillary lymphadenopathy present. Psych: Mood and affect are normal. Normally interactive                                                                                       Assessment & Plan:  #1 uncontrolled DM being addressed with meds & TLC changes. Great motivation.F/U A1c pending #2 post excess  OJ ingestion glucose spike. Glycemic load physiology discussed(see AVS) & diagram provided. #3 polydipsia #4 nevus R malar area , stable by hx

## 2014-01-31 NOTE — Progress Notes (Signed)
Patient ID: Rebecca Cook, female   DOB: 14-Nov-1955, 58 y.o.   MRN: [id number] Subjective:     Rebecca Cook is a 58 y.o. female here for a routine exam.  Current complaints: Pt has severe yeast infection.  She recently completed a 7 day OTC cream.  She reports that her HbA1C was >11 in March.  She was rectnly started on meds for DM and cholesterol and HTN.   Despite meds her [place] has been in the 200's at night.  Her LMP was 5 years prev   Gynecologic History No LMP recorded. Contraception: tubal ligation; menopause Last Pap: 2014. Results were: normal Last mammogram: 03/2013. Results were: normal per pt  Obstetric History OB History  No data available     The following portions of the patient's history were reviewed and updated as appropriate: allergies, current medications, past family history, past medical history, past social history, past surgical history and problem list.  Review of Systems Pertinent items are noted in HPI.    Objective:    BP 127/83  Pulse 93  Temp(Src) 98.3 F (36.8 C) (Oral)  Ht 5\' 8"  (1.727 m)  Wt 190 lb 1.6 oz (86.229 kg)  BMI 28.91 kg/m2  General Appearance:    Alert, cooperative, no distress, appears stated age                 Neck:   Supple, symmetrical, trachea midline, no adenopathy;    thyroid:  no enlargement/tenderness/nodules; no carotid   bruit or JVD  Back:     Symmetric, no curvature, ROM normal, no CVA tenderness  Lungs:     Clear to auscultation bilaterally, respirations unlabored  Chest Wall:    No tenderness or deformity   Heart:    Regular rate and rhythm, S1 and S2 normal, no murmur, rub   or gallop  Breast Exam:    No tenderness, masses, or nipple abnormality  Abdomen:     Soft, non-tender, bowel sounds active all four quadrants,    no masses, no organomegaly  Genitalia:    Normal female beefy red in appearance and swollen consistent with chronic yeast vaginitis; cervix os stenotic  Rectal:    Not done  Extremities:    Extremities normal, atraumatic, no cyanosis or edema  Pulses:   2+ and symmetric all extremities  Skin:   Skin color, texture, turgor normal, no rashes or lesions            Assessment:  Uncontrolled DM Chronic yeast vaginitis- reviewed with pt that unless her DM gets under control this probled would persist.   Plan:    Follow up in: 6 weeks.   Diflucan 150 mg qod x 4 doses F/u PAP and HPV

## 2014-02-11 ENCOUNTER — Other Ambulatory Visit: Payer: Self-pay

## 2014-02-11 DIAGNOSIS — Z0000 Encounter for general adult medical examination without abnormal findings: Secondary | ICD-10-CM

## 2014-02-11 DIAGNOSIS — I10 Essential (primary) hypertension: Secondary | ICD-10-CM

## 2014-02-11 DIAGNOSIS — E119 Type 2 diabetes mellitus without complications: Secondary | ICD-10-CM

## 2014-02-11 MED ORDER — LOSARTAN POTASSIUM-HCTZ 100-25 MG PO TABS
1.0000 | ORAL_TABLET | Freq: Every day | ORAL | Status: DC
Start: 2014-02-11 — End: 2014-02-12

## 2014-02-11 MED ORDER — ATORVASTATIN CALCIUM 40 MG PO TABS: 40.0000 mg | ORAL_TABLET | Freq: Every day | ORAL | Status: DC

## 2014-02-12 ENCOUNTER — Other Ambulatory Visit: Payer: Self-pay | Admitting: *Deleted

## 2014-02-12 DIAGNOSIS — E119 Type 2 diabetes mellitus without complications: Secondary | ICD-10-CM

## 2014-02-12 DIAGNOSIS — Z0000 Encounter for general adult medical examination without abnormal findings: Secondary | ICD-10-CM

## 2014-02-12 DIAGNOSIS — I10 Essential (primary) hypertension: Secondary | ICD-10-CM

## 2014-02-12 MED ORDER — LOSARTAN POTASSIUM-HCTZ 100-25 MG PO TABS: 1.0000 | ORAL_TABLET | Freq: Every day | ORAL | Status: DC

## 2014-02-12 MED ORDER — ATORVASTATIN CALCIUM 40 MG PO TABS: 40.0000 mg | ORAL_TABLET | Freq: Every day | ORAL | Status: DC

## 2014-03-07 ENCOUNTER — Other Ambulatory Visit: Payer: Self-pay

## 2014-03-07 DIAGNOSIS — F4323 Adjustment disorder with mixed anxiety and depressed mood: Secondary | ICD-10-CM

## 2014-03-07 MED ORDER — CITALOPRAM HYDROBROMIDE 20 MG PO TABS: 20.0000 mg | ORAL_TABLET | Freq: Every day | ORAL | Status: DC

## 2014-03-07 NOTE — Telephone Encounter (Signed)
OK X 3 mos 

## 2014-03-14 ENCOUNTER — Encounter: Payer: Self-pay | Admitting: Obstetrics & Gynecology

## 2014-03-14 ENCOUNTER — Ambulatory Visit (INDEPENDENT_AMBULATORY_CARE_PROVIDER_SITE_OTHER): Payer: Managed Care, Other (non HMO) | Admitting: Obstetrics & Gynecology

## 2014-03-14 VITALS — BP 129/77 | HR 81 | Temp 98.6°F | Ht 67.0 in | Wt 193.5 lb

## 2014-03-14 DIAGNOSIS — N760 Acute vaginitis: Secondary | ICD-10-CM

## 2014-03-14 NOTE — Patient Instructions (Signed)

## 2014-03-14 NOTE — Progress Notes (Signed)
Subjective:     Patient ID: Rebecca Cook, female   DOB: May 09, 1956, 58 y.o.   MRN: [id number]  HPI Pt reports that her diabetes is under better control.  She reports that her sx have improved but, she cannot see her vulva.   Review of Systems     Objective:   Physical Exam BP 129/77  Pulse 81  Temp(Src) 98.6 F (37 C) (Oral)  Ht 5\' 7"  (1.702 m)  Wt 193 lb 8 oz (87.771 kg)  BMI 30.30 kg/m2  Pt in NAD GU: EGBUS: no lesions; her vulva has cleared remarkably,  She still has evidence of resolving chronic yeast vaginitis       Assessment:     Chronic yeast vulvovaginitis in pt with h/o uncontrolled DM     Plan:     rec exercise program  F/u in 6 months or sooner prn Pt encouraged to maintain normal GLC levels.

## 2014-04-01 ENCOUNTER — Ambulatory Visit (HOSPITAL_COMMUNITY)
Admission: RE | Admit: 2014-04-01 | Discharge: 2014-04-01 | Disposition: A | Discharge status: Home / Self Care | Payer: Managed Care, Other (non HMO) | Source: Ambulatory Visit | Attending: Obstetrics & Gynecology | Admitting: Obstetrics & Gynecology

## 2014-04-01 DIAGNOSIS — Z1231 Encounter for screening mammogram for malignant neoplasm of breast: Secondary | ICD-10-CM

## 2014-04-03 ENCOUNTER — Other Ambulatory Visit: Payer: Self-pay | Admitting: Internal Medicine

## 2014-04-08 ENCOUNTER — Other Ambulatory Visit: Payer: Self-pay | Admitting: Obstetrics & Gynecology

## 2014-04-08 DIAGNOSIS — R928 Other abnormal and inconclusive findings on diagnostic imaging of breast: Secondary | ICD-10-CM

## 2014-04-09 ENCOUNTER — Ambulatory Visit
Admission: RE | Admit: 2014-04-09 | Discharge: 2014-04-09 | Disposition: A | Discharge status: Home / Self Care | Payer: Managed Care, Other (non HMO) | Source: Ambulatory Visit | Attending: Obstetrics & Gynecology | Admitting: Obstetrics & Gynecology

## 2014-04-09 ENCOUNTER — Encounter (INDEPENDENT_AMBULATORY_CARE_PROVIDER_SITE_OTHER): Payer: Self-pay

## 2014-04-09 DIAGNOSIS — R928 Other abnormal and inconclusive findings on diagnostic imaging of breast: Secondary | ICD-10-CM

## 2014-05-07 ENCOUNTER — Telehealth: Payer: Self-pay

## 2014-05-07 NOTE — Telephone Encounter (Signed)
LVM to confirm who the new PCP is.

## 2014-06-05 ENCOUNTER — Other Ambulatory Visit: Payer: Self-pay | Admitting: Internal Medicine

## 2014-06-05 NOTE — Telephone Encounter (Signed)
OK X1 

## 2014-06-05 NOTE — Telephone Encounter (Signed)
Former Rebecca Cook patient who last saw you on 01/31/14

## 2014-08-30 ENCOUNTER — Other Ambulatory Visit: Payer: Self-pay | Admitting: Internal Medicine

## 2014-08-30 ENCOUNTER — Ambulatory Visit (INDEPENDENT_AMBULATORY_CARE_PROVIDER_SITE_OTHER): Payer: Managed Care, Other (non HMO) | Admitting: Family Medicine

## 2014-08-30 ENCOUNTER — Encounter: Payer: Self-pay | Admitting: Family Medicine

## 2014-08-30 VITALS — BP 140/82 | Temp 98.8°F | Wt 202.0 lb

## 2014-08-30 DIAGNOSIS — J0100 Acute maxillary sinusitis, unspecified: Secondary | ICD-10-CM

## 2014-08-30 MED ORDER — AMOXICILLIN 875 MG PO TABS: 875.0000 mg | ORAL_TABLET | Freq: Two times a day (BID) | ORAL | Status: AC

## 2014-08-30 NOTE — Progress Notes (Signed)
OFFICE NOTE  08/30/2014  CC:  Chief Complaint  Patient presents with  . sinus infection    pt c/o sinus pressure x1week   HPI: Patient is a 58 y.o. Caucasian female who is here for "sinus pressure".  Onset of uri sx's about 5 days ago (hoarse voice, nasal cong/drainage, sinus/periorbital pain, headachy, some thick/sticky nasal discharge, today with a bit of blood in it.  No fever.  Cough last 2 d or so from PND.  No wheezing, SOB, chest tightness.  [place] HC no help, throat lozenges no help.  No nasal sprays. Takes zyrtec daily for hx of all rhinitis.  Nasal steroids rx'd in remote past but not recently.  Saline nasal spray in past but not with this illness.  She does not smoke.  Recalls last episode of sinusitis was approx 12-18 mo ago.  Pertinent PMH:  Past medical, surgical, social, and family history reviewed and no changes are noted since last office visit.  MEDS:  Outpatient Prescriptions Prior to Visit  Medication Sig Dispense Refill  . atorvastatin (LIPITOR) 40 MG tablet Take 1 tablet (40 mg total) by mouth daily. 90 tablet 1  . Canagliflozin 300 MG TABS Take 1 tablet (300 mg total) by mouth once. 30 tablet 5  . citalopram (CELEXA) 20 MG tablet TAKE 1 TABLET BY MOUTH DAILY 90 tablet 0  . gabapentin (NEURONTIN) 100 MG capsule TAKE 1 CAPSULE BY MOUTH EVERY EIGHT HOURS AS NEEDED 270 capsule 0  . glucose blood (FREESTYLE TEST STRIPS) test strip Use as instructed 100 each 12  . glucose monitoring kit (FREESTYLE) monitoring kit 1 each by Does not apply route as needed for other. 2 each 0  . Homeopathic Products (YEAST-GARD ADV HOMEOPATHIC PO) Take by mouth. OTC    . JANUMET XR 50-1000 MG TB24 TAKE 1 TABLET BY MOUTH TWICE A DAY BEFORE LUNCH AND SUPPER 60 tablet 5  . Lancets (FREESTYLE) lancets Use as instructed 100 each 12  . Loratadine (CLARITIN PO) Take by mouth. OTC    . losartan-hydrochlorothiazide (HYZAAR) 100-25 MG per tablet Take 1 tablet by mouth daily. 90 tablet 1  .  Multiple Vitamins-Minerals (MULTIVITAMIN PO) Take by mouth daily. OTC    . Aspirin-Acetaminophen-Caffeine (EXCEDRIN MIGRAINE PO) Take by mouth as needed. OTC    . fluconazole (DIFLUCAN) 150 MG tablet Take 1 tablet (150 mg total) by mouth once. 1 tab every 2 days for 8 days (Patient not taking: Reported on 08/30/2014) 4 tablet 0   No facility-administered medications prior to visit.    PE: Blood pressure 140/82, temperature 98.8 F (37.1 C), weight 202 lb (91.627 kg). VS: noted--normal. Gen: alert, NAD, NONTOXIC APPEARING. HEENT: eyes without injection, drainage, or swelling.  Ears: EACs clear, TMs with normal light reflex and landmarks.  Nose: Clear rhinorrhea, with some dried, crusty exudate adherent to mildly injected mucosa.  No purulent d/c.  R>L paranasal sinus TTP.  No facial swelling.  Throat and mouth without focal lesion.  No pharyngial swelling, erythema, or exudate.   Neck: supple, no LAD.   LUNGS: CTA bilat, nonlabored resps.   CV: RRR, no m/r/g. EXT: no c/c/e SKIN: no rash  IMPRESSION AND PLAN:  Acute sinusitis, possibly bacterial. Amoxil $RemoveBeforeDEI'875mg'HdcxDSeypMVPsTDE$  bid x 10d. Saline nasal spray recommended. Mucinex DM or robitussin DM recommended.  An After Visit Summary was printed and given to the patient.  FOLLOW UP: prn

## 2014-09-02 ENCOUNTER — Ambulatory Visit (INDEPENDENT_AMBULATORY_CARE_PROVIDER_SITE_OTHER): Payer: Managed Care, Other (non HMO) | Admitting: Internal Medicine

## 2014-09-02 ENCOUNTER — Encounter: Payer: Self-pay | Admitting: Internal Medicine

## 2014-09-02 VITALS — BP 124/72 | HR 84 | Temp 98.5°F | Wt 202.2 lb

## 2014-09-02 DIAGNOSIS — E785 Hyperlipidemia, unspecified: Secondary | ICD-10-CM

## 2014-09-02 DIAGNOSIS — E1165 Type 2 diabetes mellitus with hyperglycemia: Secondary | ICD-10-CM

## 2014-09-02 DIAGNOSIS — IMO0002 Reserved for concepts with insufficient information to code with codable children: Secondary | ICD-10-CM

## 2014-09-02 DIAGNOSIS — E1169 Type 2 diabetes mellitus with other specified complication: Secondary | ICD-10-CM | POA: Insufficient documentation

## 2014-09-02 DIAGNOSIS — I10 Essential (primary) hypertension: Secondary | ICD-10-CM

## 2014-09-02 NOTE — Assessment & Plan Note (Signed)
A1c , urine microalbumin, BMET 

## 2014-09-02 NOTE — Patient Instructions (Addendum)
Your next office appointment will be determined based upon review of your pending labs . Those instructions will be transmitted to you by mail. Minimal Blood Pressure Goal= AVERAGE < 140/90;  Ideal is an AVERAGE < 135/85. This AVERAGE should be calculated from @ least 5-7 BP readings taken @ different times of day on different days of week. You should not respond to isolated BP readings , but rather the AVERAGE for that week .Please bring your  blood pressure cuff to office visits to verify that it is reliable.It  can also be checked against the blood pressure device at the pharmacy. Finger or wrist cuffs are not dependable; an arm cuff is. Take samples of Janumet until labs performed.

## 2014-09-02 NOTE — Progress Notes (Signed)
Pre visit review using our clinic review tool, if applicable. No additional management support is needed unless otherwise documented below in the visit note. 

## 2014-09-02 NOTE — Progress Notes (Signed)
   Subjective:    Patient ID: Rebecca Cook, female    DOB: Feb 22, 1956, 58 y.o.   MRN: [id number]  HPI   She is here for follow-up of her diabetes , HTN, and dyslipidemia.  When she attempted to refill the Janumet XLR 50-1000 in August it was declined. She did not question this. Subsequently she's noted her fasting blood sugars have risen to 245-310. Prior to the medication being interrupted her fasting blood sugars were in the high 90s-150 high.  She is on no specific diet but does restrict sugars.  She's walking 45 minutes most days without cardiopulmonary symptoms  She does describe polyuria, polyphagia, polydipsia.  She has no other associated symptoms  Ophthalmologic exams up-to-date.  She's not seen a podiatrist.  Her last A1c was 11.4% on 11/25/13. Orders were written for repeat A1c when she was seen in May; these have not been completed as of this date.      Review of Systems  Significant headaches, epistaxis, chest pain, palpitations, exertional dyspnea, claudication, paroxysmal nocturnal dyspnea, or edema absent. No GI symptoms , memory loss or myalgias       Objective:   Physical Exam  Gen.: Healthy  & well-nourished, appropriate and alert.   As per CDC Guidelines ,Epic documents mild obesity as being present . Eyes: No lid/conjunctival changes, extraocular motion intact, fundi Neck: Normal range of motion, thyroid WNL Respiratory: No increased work of breathing or abnormal breath sounds Cardiac : regular rhythm, no extra heart sounds, gallop, murmur Abdomen: Protuberant.No organomegaly ,masses, bruits or aortic enlargement Lymph: No lymphadenopathy of the neck or axilla Skin: No rashes, lesions, ulcers or ischemic changes Muscle skeletal: L great nail fungal toenail changes; joints WNL Vasc:All pulses intact, no bruits present Neuro: Normal deep tendon reflexes, alert & oriented, sensation over feet WNL Psych: judgment and insight, mood and affect normal  except some non compliance suggested with F/U labs & meds      Assessment & Plan:  See Current Assessment & Plan in Problem List under specific Diagnosis

## 2014-09-02 NOTE — Assessment & Plan Note (Signed)
Lipids, LFTs, TSH  

## 2014-09-02 NOTE — Assessment & Plan Note (Signed)
Blood pressure goals reviewed. BMET 

## 2014-09-03 ENCOUNTER — Other Ambulatory Visit: Payer: Self-pay | Admitting: Internal Medicine

## 2014-09-03 NOTE — Telephone Encounter (Signed)
Prescriptions have been routed to the pharmacy #[id number]

## 2014-09-03 NOTE — Telephone Encounter (Signed)
#  30 only; she must have F/U fasting labs ( orders entered) This prescription will be refilled one time by  me; it will be necessary for her to establish with a primary care physician for additional refills. I am not taking new patients at this time as I shall be retiring on/1/17.

## 2014-09-03 NOTE — Telephone Encounter (Signed)
Okay to fill under your name? You're not listed as pcp

## 2014-09-04 ENCOUNTER — Telehealth: Payer: Self-pay | Admitting: Internal Medicine

## 2014-09-04 NOTE — Telephone Encounter (Signed)
emmi emailed °

## 2014-09-11 ENCOUNTER — Other Ambulatory Visit (INDEPENDENT_AMBULATORY_CARE_PROVIDER_SITE_OTHER): Payer: Managed Care, Other (non HMO)

## 2014-09-11 DIAGNOSIS — IMO0002 Reserved for concepts with insufficient information to code with codable children: Secondary | ICD-10-CM

## 2014-09-11 DIAGNOSIS — E785 Hyperlipidemia, unspecified: Secondary | ICD-10-CM

## 2014-09-11 DIAGNOSIS — I10 Essential (primary) hypertension: Secondary | ICD-10-CM

## 2014-09-11 DIAGNOSIS — E1165 Type 2 diabetes mellitus with hyperglycemia: Secondary | ICD-10-CM

## 2014-09-11 LAB — BASIC METABOLIC PANEL
BUN: 17 mg/dL (ref 6–23)
CO2: 25 mEq/L (ref 19–32)
Calcium: 9.2 mg/dL (ref 8.4–10.5)
Chloride: 101 mEq/L (ref 96–112)
Creatinine, Ser: 0.7 mg/dL (ref 0.4–1.2)
GFR: 95.43 mL/min (ref >60.00)
Glucose, Bld: 208 mg/dL — ABNORMAL HIGH (ref 70–99)
Potassium: 4.2 mEq/L (ref 3.5–5.1)
Sodium: 137 mEq/L (ref 135–145)

## 2014-09-11 LAB — LIPID PANEL
Cholesterol: 156 mg/dL (ref 0–200)
HDL: 33.5 mg/dL — ABNORMAL LOW (ref >39.00)
NonHDL: 122.5
Total CHOL/HDL Ratio: 5
Triglycerides: 242 mg/dL — ABNORMAL HIGH (ref 0.0–149.0)
VLDL: 48.4 mg/dL — ABNORMAL HIGH (ref 0.0–40.0)

## 2014-09-11 LAB — HEMOGLOBIN A1C: Hgb A1c MFr Bld: 12 % — ABNORMAL HIGH (ref 4.6–6.5)

## 2014-09-11 LAB — TSH: TSH: 1.94 u[IU]/mL (ref 0.35–4.50)

## 2014-09-11 LAB — HEPATIC FUNCTION PANEL
ALT: 30 U/L (ref 0–35)
AST: 29 U/L (ref 0–37)
Albumin: 3.7 g/dL (ref 3.5–5.2)
Alkaline Phosphatase: 109 U/L (ref 39–117)
Bilirubin, Direct: 0.1 mg/dL (ref 0.0–0.3)
Total Bilirubin: 0.7 mg/dL (ref 0.2–1.2)
Total Protein: 6.8 g/dL (ref 6.0–8.3)

## 2014-09-11 LAB — MICROALBUMIN / CREATININE URINE RATIO
Creatinine,U: 109.4 mg/dL
Microalb Creat Ratio: 0.7 mg/g (ref 0.0–30.0)
Microalb, Ur: 0.8 mg/dL (ref 0.0–1.9)

## 2014-09-11 LAB — LDL CHOLESTEROL, DIRECT: Direct LDL: 90.6 mg/dL

## 2014-09-14 ENCOUNTER — Other Ambulatory Visit: Payer: Self-pay | Admitting: Internal Medicine

## 2014-09-14 DIAGNOSIS — E1165 Type 2 diabetes mellitus with hyperglycemia: Secondary | ICD-10-CM

## 2014-09-14 DIAGNOSIS — IMO0002 Reserved for concepts with insufficient information to code with codable children: Secondary | ICD-10-CM

## 2014-09-22 ENCOUNTER — Other Ambulatory Visit: Payer: Self-pay | Admitting: Physician Assistant

## 2014-09-22 DIAGNOSIS — E1165 Type 2 diabetes mellitus with hyperglycemia: Secondary | ICD-10-CM

## 2014-09-22 DIAGNOSIS — IMO0002 Reserved for concepts with insufficient information to code with codable children: Secondary | ICD-10-CM

## 2014-09-22 MED ORDER — SITAGLIP PHOS-METFORMIN HCL ER 50-1000 MG PO TB24: ORAL_TABLET | ORAL | Status: DC

## 2014-09-22 MED ORDER — CANAGLIFLOZIN 300 MG PO TABS: 300.0000 mg | ORAL_TABLET | Freq: Once | ORAL | Status: DC

## 2014-09-22 NOTE — Telephone Encounter (Signed)
I do no see Invokana listed on current medication list. This is a former patient of Stacy Gardner. Patient request prescription be sent to [facility] in [place].

## 2014-09-22 NOTE — Telephone Encounter (Signed)
OK X 1 month until seen by Endocrinology

## 2014-09-22 NOTE — Telephone Encounter (Signed)
Left message for patient stating savings cards are at the front desk for her

## 2014-09-22 NOTE — Telephone Encounter (Signed)
No samples available but savings cards for both available . Her Pharmacist will be able to tell if she can use these

## 2014-09-22 NOTE — Telephone Encounter (Signed)
Pt requesting samples or prescription for Rebecca Cook. Pt states she rec'd samples from Dr Linna Darner of this, pt will be out of Rebecca in 1or 2 days.  Invokana is also a diabetic medication she needs. Pt states Dr Linna Darner has prescribed both for her. She is scheduled to see Dr Cammy Copa on Jan 12 and was hoping to get enough until 10/07/14

## 2014-09-29 ENCOUNTER — Telehealth: Payer: Self-pay | Admitting: Internal Medicine

## 2014-09-29 NOTE — Telephone Encounter (Signed)
Pt called in and wanted to see if Dr Linna Darner could call in a 90 day supply to her mail order for the following:  Janument ( [facility] must be sent to [facility] order) Lipitor 40mg  Citalopram 20mg  Losartan 100-25mg  Gabapentin 100mg   * explain to her that we will have to establish with Marya Amsler or Dr Doug Sou to get on reg fu and med refils.  We are going to setup a est appt for her depending on the refills

## 2014-09-29 NOTE — Telephone Encounter (Signed)
Okay to fill prescriptions under your name?

## 2014-09-29 NOTE — Telephone Encounter (Signed)
3 mos of all EXCEPT Janumet until she establishes with PCP. We need to see if Endocrinology will change that medication

## 2014-09-30 MED ORDER — LOSARTAN POTASSIUM-HCTZ 100-25 MG PO TABS: 1.0000 | ORAL_TABLET | Freq: Every day | ORAL | Status: DC

## 2014-09-30 MED ORDER — ATORVASTATIN CALCIUM 40 MG PO TABS: 40.0000 mg | ORAL_TABLET | Freq: Every day | ORAL | Status: DC

## 2014-09-30 MED ORDER — CITALOPRAM HYDROBROMIDE 20 MG PO TABS: 20.0000 mg | ORAL_TABLET | Freq: Every day | ORAL | Status: DC

## 2014-09-30 MED ORDER — GABAPENTIN 100 MG PO CAPS: ORAL_CAPSULE | ORAL | Status: DC

## 2014-09-30 NOTE — Telephone Encounter (Signed)
done

## 2014-10-01 ENCOUNTER — Telehealth: Payer: Self-pay | Admitting: Internal Medicine

## 2014-10-01 NOTE — Telephone Encounter (Signed)
Only Janumet 50/500 mg samples available ; she can pick Endocrine will most likely change this medication when seen (samples on your desk). [employer]

## 2014-10-01 NOTE — Telephone Encounter (Signed)
Pt called back in and said that she has an appt with the Endocrinology on the 12th but she is completely out of the [place].  She is worried about not having it til the 12th and she stated that she is already starting to feel bad.

## 2014-10-02 NOTE — Telephone Encounter (Signed)
Phone call to patient and advised her via voicemail she can pick up her samples.

## 2014-10-03 NOTE — Telephone Encounter (Signed)
Pt is calling and wants to know what she should do, she cannot take the regular Janumet samples (that were to be provided for her) due to being too hard on her stomach, and causes abd cramps. She cannot get her mail order (she says its a long story and does not want to explain) She can take the Janumet XR (of which samples we apparently do not have), should she go without her medication (she is asking all these questions)?   [phone]

## 2014-10-07 ENCOUNTER — Other Ambulatory Visit: Payer: Self-pay | Admitting: Internal Medicine

## 2014-10-07 ENCOUNTER — Ambulatory Visit (INDEPENDENT_AMBULATORY_CARE_PROVIDER_SITE_OTHER): Payer: Managed Care, Other (non HMO) | Admitting: Endocrinology

## 2014-10-07 ENCOUNTER — Encounter: Payer: Self-pay | Admitting: Endocrinology

## 2014-10-07 VITALS — BP 115/84 | HR 94 | Temp 97.9°F | Resp 14 | Ht 67.0 in | Wt 201.0 lb

## 2014-10-07 DIAGNOSIS — IMO0002 Reserved for concepts with insufficient information to code with codable children: Secondary | ICD-10-CM

## 2014-10-07 DIAGNOSIS — E1165 Type 2 diabetes mellitus with hyperglycemia: Secondary | ICD-10-CM

## 2014-10-07 DIAGNOSIS — E785 Hyperlipidemia, unspecified: Secondary | ICD-10-CM

## 2014-10-07 DIAGNOSIS — I10 Essential (primary) hypertension: Secondary | ICD-10-CM

## 2014-10-07 LAB — URINALYSIS, ROUTINE W REFLEX MICROSCOPIC
Bilirubin Urine: NEGATIVE
Hgb urine dipstick: NEGATIVE
Ketones, ur: NEGATIVE
Leukocytes, UA: NEGATIVE
Nitrite: NEGATIVE
RBC / HPF: NONE SEEN (ref 0–?)
Specific Gravity, Urine: 1.01 (ref 1.000–1.030)
Total Protein, Urine: NEGATIVE
Urine Glucose: >=1000 — AB
Urobilinogen, UA: 0.2 (ref 0.0–1.0)
pH: 6 (ref 5.0–8.0)

## 2014-10-07 LAB — POCT CBG (FASTING - GLUCOSE)-MANUAL ENTRY: Glucose Fasting, POC: 458 mg/dL — AB (ref 70–99)

## 2014-10-07 MED ORDER — GLIMEPIRIDE 4 MG PO TABS: 4.0000 mg | ORAL_TABLET | Freq: Every day | ORAL | Status: DC

## 2014-10-07 NOTE — Patient Instructions (Addendum)
Janumet 2 daily with a meal  Glimeperide 4mg  once daily until sugar < 150 then stop  Continue Invokana  Please check blood sugars at least half the time about 2 hours after any meal and 3 times per week on waking up. Please bring blood sugar monitor to each visit

## 2014-10-07 NOTE — Progress Notes (Signed)
Patient ID: Rebecca Cook, female   DOB: 02/23/1956, 59 y.o.   MRN: [id number]           Reason for Appointment: Consultation for Type 2 Diabetes  Referring physician: Linna Darner  History of Present Illness:          Diagnosis: Type 2 diabetes mellitus, date of diagnosis: 3/15      Past history:  At the time of diagnosis she was having difficult symptoms of excessive thirst, extreme lethargy and tingling in her legs and feet Her glucose was around 350 and A1c was 12% She was apparently started on metformin which causes diarrhea and this was changed to Janumet XR Not clear what her prescription history is but she was also given Invokana probably about a month later With this her blood sugars were improved and she thinks they were near 100  Apparently by mistake in August 2015 her Janumet XR was not refilled and she was on Invokana alone With this her blood sugars went up again significantly and she was having symptoms of high blood sugars She will back to her primary care physician in early 12/15 and was given 2 weeks of samples of Janumet XR but no prescription She thinks her blood sugars improve right away and fasting blood sugars were near 100 again and she was feeling symptomatically better  However again she is out of her Janumet XR since last month and she is having symptoms of high blood sugars. She thinks her blood sugars are in the mornings around 200+ although has not checked any for the last 3 days She has been trying to do some walking and is usually able to walk 5 days a week, up to 45 minutes She does try to watch her diet with restriction of carbohydrates and fats although has not been instructed by a dietitian  Recent history:        Oral hypoglycemic drugs the patient is taking are: Invokana 300 mg      Side effects from medications have been:  diarrhea from metformin   Compliance with the medical regimen: Fair  Glucose monitoring:  done one time a day          Glucometer: Freestyle     Blood Glucose readings as above  Self-care: The diet that the patient has been following is: tries to limit fats.     Meals: 3 meals per day. Breakfast is cheerios, sometimes English muffin and cheese.  Lunch is usually low carbohydrate  Exercise: Walking          Dietician visit, most recent: None .               Weight history:185-230 previously  Wt Readings from Last 3 Encounters:  10/07/14 201 lb (91.173 kg)  09/02/14 202 lb 4 oz (91.74 kg)  08/30/14 202 lb (91.627 kg)    Glycemic control:   Lab Results  Component Value Date   HGBA1C 12.0* 09/11/2014   HGBA1C 11.4* 11/25/2013   Lab Results  Component Value Date   MICROALBUR 0.8 09/11/2014   LDLCALC 155* 11/25/2013   CREATININE 0.7 09/11/2014         Medication List       This list is accurate as of: 10/07/14  9:41 AM.  Always use your most recent med list.               atorvastatin 40 MG tablet  Commonly known as:  LIPITOR  Take 1 tablet (40 mg  total) by mouth daily.     canagliflozin 300 MG Tabs tablet  Commonly known as:  INVOKANA  Take 300 mg by mouth once.     citalopram 20 MG tablet  Commonly known as:  CELEXA  Take 1 tablet (20 mg total) by mouth daily.     CLARITIN PO  Take by mouth. OTC     EXCEDRIN MIGRAINE PO  Take by mouth as needed. OTC     fluconazole 150 MG tablet  Commonly known as:  DIFLUCAN  Take 1 tablet (150 mg total) by mouth once. 1 tab every 2 days for 8 days     freestyle lancets  Use as instructed     gabapentin 100 MG capsule  Commonly known as:  NEURONTIN  TAKE 1 CAPSULE BY MOUTH EVERY EIGHT HOURS AS NEEDED     glucose blood test strip  Commonly known as:  FREESTYLE TEST STRIPS  Use as instructed     glucose monitoring kit monitoring kit  1 each by Does not apply route as needed for other.     losartan-hydrochlorothiazide 100-25 MG per tablet  Commonly known as:  HYZAAR  Take 1 tablet by mouth daily.     MULTIVITAMIN PO  Take by  mouth daily. OTC     SitaGLIPtin-MetFORMIN HCl 50-1000 MG Tb24  Commonly known as:  JANUMET XR  TAKE 1 TABLET BY MOUTH TWICE A DAY BEFORE LUNCH AND SUPPER     YEAST-GARD ADV HOMEOPATHIC PO  Take by mouth. OTC        Allergies: No Known Allergies  Past Medical History  Diagnosis Date  . Frequent headaches   . Diabetes mellitus, type II   . GERD (gastroesophageal reflux disease)   . Hypertension   . Kidney stones     None current, last had kidney stones in 1986    Past Surgical History  Procedure Laterality Date  . Tubal ligation  1985  . Tonsillectomy and adenoidectomy  1985    Family History  Problem Relation Age of Onset  . Cancer Mother     Lung  . Heart disease Father   . Hypertension Father   . Heart disease Paternal Grandmother     Thyroid  . Hypertension Paternal Grandmother   . Cancer - Other Brother   . Diabetes Maternal Grandfather     Social History:  reports that she has quit smoking. She has never used smokeless tobacco. She reports that she drinks alcohol. She reports that she does not use illicit drugs.    Review of Systems       Vision is normal. Most recent eye exam was 5/15       Lipids: These have been abnormal and she is on Lipitor with good control and no side effects       Lab Results  Component Value Date   CHOL 156 09/11/2014   HDL 33.50* 09/11/2014   LDLCALC 155* 11/25/2013   LDLDIRECT 90.6 09/11/2014   TRIG 242.0* 09/11/2014   CHOLHDL 5 09/11/2014                  Skin: No rash or infections     Thyroid:  No  unusual fatigue.     The blood pressure has been high for several years and currently is controlled with Hyzaar.     No swelling of feet.     No shortness of breath or chest tightness  on exertion.     Bowel habits: Normal.  No joint  pains.          No history of Numbness, tingling or burning in feet at present     No recent discomfort with urination  No history of vaginal candidiasis      LABS:  Office Visit on 10/07/2014  Component Date Value Ref Range Status  . Glucose Fasting, POC 10/07/2014 458* 70 - 99 mg/dL Final    Physical Examination:  BP 115/84 mmHg  Pulse 94  Temp(Src) 97.9 F (36.6 C)  Resp 14  Ht $R'5\' 7"'CD$  (1.702 m)  Wt 201 lb (91.173 kg)  BMI 31.47 kg/m2  SpO2 95%  GENERAL:         Patient has generalized obesity.   HEENT:         Eye exam shows normal external appearance. Fundus exam shows no retinopathy. Oral exam shows normal mucosa .  NECK:         General:  Neck exam shows no lymphadenopathy. Carotids are normal to palpation and no bruit heard.  Thyroid is not enlarged and no nodules felt.   LUNGS:         Chest is symmetrical. Lungs are clear to auscultation.Marland Kitchen   HEART:         Heart sounds:  S1 and S2 are normal. No murmurs or clicks heard., no S3 or S4.   ABDOMEN:   There is no distention present. Liver and spleen are not palpable. No other mass or tenderness present.  EXTREMITIES:     There is no edema. No skin lesions present.Marland Kitchen  NEUROLOGICAL:   Vibration sense is mildly reduced in toes. Ankle jerks are 1+ bilaterally.          Diabetic foot exam shows normal monofilament sensation in the toes and plantar surfaces, no skin lesions or ulcers on the feet and normal pedal pulses except Absent dorsalis pedis pulses MUSCULOSKELETAL:       There is no enlargement or deformity of the joints. Spine is normal to inspection.Marland Kitchen   SKIN:       No rash or lesions of concern.         ASSESSMENT:  Diabetes type 2, uncontrolled     She has had marked hyperglycemia recently with symptoms with Invokana alone. She appears to have been better controlled with at least fasting blood sugars when she was taking a combination of Janumet XR maximum dose and Invokana together but she has not been able to get her prescription of Janumet XR recently Her sugars have been high only in the last 2-3 weeks since she ran out of Janumet XR Currently has a relatively good  lifestyle with healthy diet and regular exercise program with frequent walking Sometimes will have unbalanced meals at breakfast with cereal only However has been unable to lose much weight She is monitoring her glucose but only once a day in the morning before breakfast  Complications: None evident  PLAN:   Since she is significantly symptomatic with hyperglycemia she will start Amaryl 4 mg daily until the blood sugar is at least below 150 fasting.  She can then stop this  Check urinalysis for ruling out ketonuria today  Start maximum dose Janumet XR as before, 2 tablets daily with food  Start monitoring at least 50% of readings about 2 hours after meals and bring monitor for download on each visit  She will call if blood sugars are not improved by Friday  Continue prudent diet and regular walking program  She  will let us know if she wants to see the dietitian in consultation also  Patient was counseled on basic effects of diabetes, diabetes progression, properties of various medications, timing of medications, timing of glucose monitoring, blood sugar targets, lifestyle changes, preventive care with diet exams and foot care and regular follow-up  Total visit time including counseling = 50 minutes  Lyzette Reinhardt 10/07/2014, 9:41 AM   Note: This office note was prepared with Estate agent. Any transcriptional errors that result from this process are unintentional.

## 2014-10-22 ENCOUNTER — Ambulatory Visit: Payer: Managed Care, Other (non HMO) | Admitting: Endocrinology

## 2014-10-28 ENCOUNTER — Ambulatory Visit (INDEPENDENT_AMBULATORY_CARE_PROVIDER_SITE_OTHER): Payer: Managed Care, Other (non HMO) | Admitting: Endocrinology

## 2014-10-28 ENCOUNTER — Encounter: Payer: Self-pay | Admitting: Endocrinology

## 2014-10-28 VITALS — BP 119/82 | HR 87 | Temp 98.0°F | Resp 16 | Ht 67.0 in | Wt 200.4 lb

## 2014-10-28 DIAGNOSIS — IMO0002 Reserved for concepts with insufficient information to code with codable children: Secondary | ICD-10-CM

## 2014-10-28 DIAGNOSIS — E1165 Type 2 diabetes mellitus with hyperglycemia: Secondary | ICD-10-CM

## 2014-10-28 NOTE — Progress Notes (Signed)
Patient ID: Rebecca Cook, female   DOB: Dec 27, 1955, 59 y.o.   MRN: [id number]           Reason for Appointment:  for Type 2 Diabetes  Referring physician: Linna Darner  History of Present Illness:          Diagnosis: Type 2 diabetes mellitus, date of diagnosis: 3/15      Past history:  At the time of diagnosis she was having difficult symptoms of excessive thirst, extreme lethargy and tingling in her legs and feet Her glucose was around 350 and A1c was 12% She was apparently started on metformin which causes diarrhea and this was changed to Janumet XR Not clear what her prescription history is but she was also given Invokana  later With this her blood sugars were improved and she thinks they were near 100 In August 2015 her Janumet XR was not refilled and she was on Invokana alone which caused markedly increased blood sugars and she had symptomatic hyperglycemia  Recent history:  She was seen in consultation when her A1c was 12% and she had symptomatically hyperglycemia from running out of her Janumet XR for about a month or so. Because of her blood sugars being significantly high she was started on Amaryl also in addition to restarting her Janumet XR She says her blood sugars came down fairly quickly and within a week she was able to stop her Amaryl as directed She has no side effects with taking Janumet XR, 2 tablets together. She feels symptomatically much better Although she has checked her sugar before breakfast and after supper she has only 4 readings on her current monitor which she had to get to replace the old one.  Oral hypoglycemic drugs the patient is taking are: Invokana 300 mg in am, Janumet XR, 2 tablets daily      Side effects from medications have been:  diarrhea from metformin   Compliance with the medical regimen: Fair  Glucose monitoring:  done one time a day         Glucometer: Freestyle     Blood Glucose readings  108-159, usually not higher after evening meal,  highest reading this morning  Self-care: The diet that the patient has been following is: tries to limit fats.     Meals: 3 meals per day. Breakfast is cheerios, sometimes English muffin and cheese.  Lunch is usually low carbohydrate  Exercise: Walking less recently, upto 45 min otherwise outdoors           Dietician visit, most recent: None .               Weight history:185-230 previously  Wt Readings from Last 3 Encounters:  10/28/14 200 lb 6.4 oz (90.901 kg)  10/07/14 201 lb (91.173 kg)  09/02/14 202 lb 4 oz (91.74 kg)    Glycemic control:   Lab Results  Component Value Date   HGBA1C 12.0* 09/11/2014   HGBA1C 11.4* 11/25/2013   Lab Results  Component Value Date   MICROALBUR 0.8 09/11/2014   LDLCALC 155* 11/25/2013   CREATININE 0.7 09/11/2014         Medication List       This list is accurate as of: 10/28/14 11:18 AM.  Always use your most recent med list.               atorvastatin 40 MG tablet  Commonly known as:  LIPITOR  Take 1 tablet (40 mg total) by mouth daily.  canagliflozin 300 MG Tabs tablet  Commonly known as:  INVOKANA  Take 300 mg by mouth once.     citalopram 20 MG tablet  Commonly known as:  CELEXA  Take 1 tablet (20 mg total) by mouth daily.     CLARITIN PO  Take by mouth. OTC     EXCEDRIN MIGRAINE PO  Take by mouth as needed. OTC     fluconazole 150 MG tablet  Commonly known as:  DIFLUCAN  Take 1 tablet (150 mg total) by mouth once. 1 tab every 2 days for 8 days     freestyle lancets  Use as instructed     gabapentin 100 MG capsule  Commonly known as:  NEURONTIN  TAKE 1 CAPSULE BY MOUTH EVERY EIGHT HOURS AS NEEDED     glimepiride 4 MG tablet  Commonly known as:  AMARYL  Take 1 tablet (4 mg total) by mouth daily before breakfast.     glucose blood test strip  Commonly known as:  FREESTYLE TEST STRIPS  Use as instructed     glucose monitoring kit monitoring kit  1 each by Does not apply route as needed for other.       losartan-hydrochlorothiazide 100-25 MG per tablet  Commonly known as:  HYZAAR  Take 1 tablet by mouth daily.     MULTIVITAMIN PO  Take by mouth daily. OTC     SitaGLIPtin-MetFORMIN HCl 50-1000 MG Tb24  Commonly known as:  JANUMET XR  TAKE 1 TABLET BY MOUTH TWICE A DAY BEFORE LUNCH AND SUPPER     YEAST-GARD ADV HOMEOPATHIC PO  Take by mouth. OTC        Allergies: No Known Allergies  Past Medical History  Diagnosis Date  . Frequent headaches   . Diabetes mellitus, type II   . GERD (gastroesophageal reflux disease)   . Hypertension   . Kidney stones     None current, last had kidney stones in 1986    Past Surgical History  Procedure Laterality Date  . Tubal ligation  1985  . Tonsillectomy and adenoidectomy  1985    Family History  Problem Relation Age of Onset  . Cancer Mother     Lung  . Heart disease Father   . Hypertension Father   . Heart disease Paternal Grandmother     Thyroid  . Hypertension Paternal Grandmother   . Cancer - Other Brother   . Diabetes Maternal Grandfather     Social History:  reports that she has quit smoking. She has never used smokeless tobacco. She reports that she drinks alcohol. She reports that she does not use illicit drugs.    Review of Systems       Vision is normal. Most recent eye exam was 5/15       Lipids: These have been abnormal and she is on Lipitor with good control and no side effects       Lab Results  Component Value Date   CHOL 156 09/11/2014   HDL 33.50* 09/11/2014   LDLCALC 155* 11/25/2013   LDLDIRECT 90.6 09/11/2014   TRIG 242.0* 09/11/2014   CHOLHDL 5 09/11/2014       The blood pressure has been high for several years and currently is controlled with Hyzaar.    LABS:  No recent labs  Physical Examination:  BP 119/82 mmHg  Pulse 87  Temp(Src) 98 F (36.7 C)  Resp 16  Ht _0  (1.702 m)  Wt 200 lb 6.4 oz (90.901  kg)  BMI 31.38 kg/m2  SpO2 95%      ASSESSMENT:  Diabetes type 2,  uncontrolled     She has had marked hyperglycemia when she was on Invokana alone and A1c had gone up to 12% With starting Janumet XR maximum dose of blood sugars have come down significantly and are fairly good She doesn't have many readings on her monitor but the last 2 fasting readings are slightly high Currently not taking Amaryl which she tried for only a week to help the very high readings   PLAN:   Since she is getting excellent readings both morning and evening most of the time will not change her regimen as yet  Given her ideas about doing indoor exercises as she has not exercised as much as usual  She will need a follow-up A1c in 2 months; reminded her to call me if blood sugars are not consistently good   Rebecca Cook 10/28/2014, 11:18 AM   Note: This office note was prepared with Estate agent. Any transcriptional errors that result from this process are unintentional.

## 2014-10-28 NOTE — Patient Instructions (Signed)
Please check blood sugars at least half the time about 2 hours after any meal and 3 times per week on waking up. Please bring blood sugar monitor to each visit. Recommended blood sugar levels about 2 hours after meal is 140-180 and on waking up 90-130   INDOOR EXERCISE IDEAS Here's an example of a creative, compact workout (perform each move for 2-3 minutes): Marland Kitchen Warm up. Put on some music that makes you feel like moving, and dance around the living room. . Watch exercise shows on TV and move along with them. You don't have to invest in a lot of exercise videos if your budget is strapped. There are tons of free cable channels that have daily exercise shows on them for all levels - beginner through advanced. . Walk up and down the steps. . Do dumbbell curls and presses (if you don't have weights, use full water bottles). . Do assisted squats, keeping your back on a fitness ball against the wall or using the back of the couch for support. . Shadow box: Lift and lower the left leg; jab with the right arm, then the left; then lift and lower the right leg. Marland Kitchen Fence (you don't even need swords). Pretend you're holding a sword in each hand. Create an X pattern standing still, then moving forward and back. . Hop on your exercise bike or treadmill -- or, for something different, use a weighted hula hoop. If you don't have any of those, just go back to dancing. . Do abdominal crunches (hold a weighted ball for added resistance). Marland Kitchen Cool down with [employer] "I Feel Good" -- or whatever tune makes you feel good

## 2014-11-17 ENCOUNTER — Other Ambulatory Visit: Payer: Self-pay | Admitting: Internal Medicine

## 2014-12-03 ENCOUNTER — Other Ambulatory Visit: Payer: Self-pay | Admitting: Internal Medicine

## 2014-12-03 NOTE — Telephone Encounter (Signed)
OK X 90 days Needs PCP by next refill

## 2014-12-03 NOTE — Telephone Encounter (Signed)
Okay for refill on Celexa?

## 2014-12-05 ENCOUNTER — Other Ambulatory Visit: Payer: Self-pay | Admitting: Internal Medicine

## 2014-12-08 ENCOUNTER — Other Ambulatory Visit: Payer: Self-pay

## 2014-12-08 ENCOUNTER — Other Ambulatory Visit: Payer: Self-pay | Admitting: *Deleted

## 2014-12-08 MED ORDER — GABAPENTIN 100 MG PO CAPS: ORAL_CAPSULE | ORAL | Status: DC

## 2014-12-08 MED ORDER — SITAGLIP PHOS-METFORMIN HCL ER 50-1000 MG PO TB24: ORAL_TABLET | ORAL | Status: DC

## 2014-12-24 ENCOUNTER — Other Ambulatory Visit (INDEPENDENT_AMBULATORY_CARE_PROVIDER_SITE_OTHER): Payer: Managed Care, Other (non HMO)

## 2014-12-24 DIAGNOSIS — E1165 Type 2 diabetes mellitus with hyperglycemia: Secondary | ICD-10-CM | POA: Diagnosis not present

## 2014-12-24 DIAGNOSIS — IMO0002 Reserved for concepts with insufficient information to code with codable children: Secondary | ICD-10-CM

## 2014-12-24 LAB — COMPREHENSIVE METABOLIC PANEL
ALT: 27 U/L (ref 0–35)
AST: 31 U/L (ref 0–37)
Albumin: 4.1 g/dL (ref 3.5–5.2)
Alkaline Phosphatase: 142 U/L — ABNORMAL HIGH (ref 39–117)
BUN: 14 mg/dL (ref 6–23)
CO2: 29 mEq/L (ref 19–32)
Calcium: 9.7 mg/dL (ref 8.4–10.5)
Chloride: 103 mEq/L (ref 96–112)
Creatinine, Ser: 0.77 mg/dL (ref 0.40–1.20)
GFR: 81.2 mL/min (ref >60.00)
Glucose, Bld: 119 mg/dL — ABNORMAL HIGH (ref 70–99)
Potassium: 3.9 mEq/L (ref 3.5–5.1)
Sodium: 137 mEq/L (ref 135–145)
Total Bilirubin: 0.7 mg/dL (ref 0.2–1.2)
Total Protein: 7.5 g/dL (ref 6.0–8.3)

## 2014-12-24 LAB — HEMOGLOBIN A1C: Hgb A1c MFr Bld: 6.8 % — ABNORMAL HIGH (ref 4.6–6.5)

## 2014-12-29 ENCOUNTER — Ambulatory Visit (INDEPENDENT_AMBULATORY_CARE_PROVIDER_SITE_OTHER): Payer: Managed Care, Other (non HMO) | Admitting: Endocrinology

## 2014-12-29 ENCOUNTER — Encounter: Payer: Self-pay | Admitting: Endocrinology

## 2014-12-29 VITALS — BP 118/70 | HR 85 | Temp 98.6°F | Resp 14 | Wt 201.0 lb

## 2014-12-29 DIAGNOSIS — E119 Type 2 diabetes mellitus without complications: Secondary | ICD-10-CM

## 2014-12-29 DIAGNOSIS — E785 Hyperlipidemia, unspecified: Secondary | ICD-10-CM

## 2014-12-29 NOTE — Progress Notes (Signed)
Patient ID: Rebecca Cook, female   DOB: 1955-11-19, 59 y.o.   MRN: [id number]           Reason for Appointment:  Follow-up for Type 2 Diabetes  Referring physician: Linna Darner  History of Present Illness:          Diagnosis: Type 2 diabetes mellitus, date of diagnosis: 3/15      Past history:  At the time of diagnosis she was having difficult symptoms of excessive thirst, extreme lethargy and tingling in her legs and feet Her glucose was around 350 and A1c was 12% She was apparently started on metformin which causes diarrhea and this was changed to Janumet XR Not clear what her prescription history is but she was also given Invokana  later With this her blood sugars were improved and she thinks they were near 100 In August 2015 her Janumet XR was not refilled and she was on Invokana alone which caused markedly increased blood sugars and she had symptomatic hyperglycemia She was seen in consultation when her A1c was 12% and she had symptomatically hyperglycemia from running out of her Janumet XR for about a month or so.  Recent history:  Her blood sugars came down fairly quickly and within a week after restarting her Janumet XR and she was able to stop her Amaryl shortly She has no side effects with taking Janumet XR, 2 tablets together. Her A1c is now down to 6. 8% She did not bring her blood sugar diary for review and not clear if she is checking her blood sugars after meals consistently also Currently using a generic monitor because of high cost for the freestyle strips. Her monitor has only readings for about a week She does tend to have mild increase readings fasting More recently has been more motivated to exercise but surprisingly has not lost any weight as yet  Oral hypoglycemic drugs the patient is taking are: Invokana 300 mg in am, Janumet XR, 2 tablets daily      Side effects from medications have been:  diarrhea from regular metformin   Compliance with the medical  regimen: Good  Glucose monitoring:  done one time a day         Glucometer: Relion    Blood Glucose readings    PRE-MEAL Breakfast Lunch Dinner Bedtime Overall  Glucose range: 129, 110,  135   151   Mean/median:     132    Self-care: The diet that the patient has been following is: tries to limit fats.     Meals: 3 meals per day. Breakfast is cheerios, sometimes English muffin and cheese.  Lunch is usually low carbohydrate  Exercise: Walking etc upto 45 min, otherwise active outdoors, exercising 5/7 days a week           Dietician visit, most recent: None .               Weight history:185-230 previously  Wt Readings from Last 3 Encounters:  12/29/14 201 lb (91.173 kg)  10/28/14 200 lb 6.4 oz (90.901 kg)  10/07/14 201 lb (91.173 kg)    Glycemic control:   Lab Results  Component Value Date   HGBA1C 6.8* 12/24/2014   HGBA1C 12.0* 09/11/2014   HGBA1C 11.4* 11/25/2013   Lab Results  Component Value Date   MICROALBUR 0.8 09/11/2014   LDLCALC 155* 11/25/2013   CREATININE 0.77 12/24/2014         Medication List       This  list is accurate as of: 12/29/14  8:43 AM.  Always use your most recent med list.               atorvastatin 40 MG tablet  Commonly known as:  LIPITOR  TAKE 1 TABLET BY MOUTH DAILY     citalopram 20 MG tablet  Commonly known as:  CELEXA  TAKE 1 TABLET BY MOUTH DAILY     CLARITIN PO  Take by mouth. OTC     EXCEDRIN MIGRAINE PO  Take by mouth as needed. OTC     fluconazole 150 MG tablet  Commonly known as:  DIFLUCAN  Take 1 tablet (150 mg total) by mouth once. 1 tab every 2 days for 8 days     freestyle lancets  Use as instructed     gabapentin 100 MG capsule  Commonly known as:  NEURONTIN  TAKE 1 CAPSULE BY MOUTH EVERY EIGHT HOURS AS NEEDED     glimepiride 4 MG tablet  Commonly known as:  AMARYL  Take 1 tablet (4 mg total) by mouth daily before breakfast.     glucose blood test strip  Commonly known as:  FREESTYLE TEST STRIPS    Use as instructed     glucose monitoring kit monitoring kit  1 each by Does not apply route as needed for other.     INVOKANA 300 MG Tabs tablet  Generic drug:  canagliflozin  TAKE 1 TABLET BY MOUTH DAILY     losartan-hydrochlorothiazide 100-25 MG per tablet  Commonly known as:  HYZAAR  TAKE 1 TABLET BY MOUTH DAILY     MULTIVITAMIN PO  Take by mouth daily. OTC     SitaGLIPtin-MetFORMIN HCl 50-1000 MG Tb24  Commonly known as:  JANUMET XR  TAKE 1 TABLET BY MOUTH TWICE A DAY BEFORE LUNCH AND SUPPER     YEAST-GARD ADV HOMEOPATHIC PO  Take by mouth. OTC        Allergies: No Known Allergies  Past Medical History  Diagnosis Date  . Frequent headaches   . Diabetes mellitus, type II   . GERD (gastroesophageal reflux disease)   . Hypertension   . Kidney stones     None current, last had kidney stones in 1986    Past Surgical History  Procedure Laterality Date  . Tubal ligation  1985  . Tonsillectomy and adenoidectomy  1985    Family History  Problem Relation Age of Onset  . Cancer Mother     Lung  . Heart disease Father   . Hypertension Father   . Heart disease Paternal Grandmother     Thyroid  . Hypertension Paternal Grandmother   . Cancer - Other Brother   . Diabetes Maternal Grandfather     Social History:  reports that she has quit smoking. She has never used smokeless tobacco. She reports that she drinks alcohol. She reports that she does not use illicit drugs.    Review of Systems   Most recent eye exam was 5/15       Lipids: LDL has been abnormal and she is on Lipitor with good control and no side effects. Does need follow-up of her triglycerides       Lab Results  Component Value Date   CHOL 156 09/11/2014   HDL 33.50* 09/11/2014   LDLCALC 155* 11/25/2013   LDLDIRECT 90.6 09/11/2014   TRIG 242.0* 09/11/2014   CHOLHDL 5 09/11/2014       The blood pressure has been high for several  years and currently is controlled with Hyzaar.      Physical Examination:  BP 118/70 mmHg  Pulse 85  Temp(Src) 98.6 F (37 C) (Oral)  Resp 14  Wt 201 lb (91.173 kg)  SpO2 97%    no pedal edema  ASSESSMENT:  Diabetes type 2, uncontrolled     She  had marked hyperglycemia when she was on Invokana alone and A1c had gone up to 12% With starting Janumet XR maximum dose her blood sugars have come down significantly  A1c is excellent now at 6.8% She doesn't have many readings on her monitor since she started using the generic monitor recently Her glucose reading at home was fairly close to the lab reading the same day and she can continue to use this  PLAN:   Since she is starting to get into her exercise program more consistently will not increase any of her medications  She was started using a blood sugar diary if she wants to continue the generic monitor  Follow-up in 4 months   Madison Direnzo 12/29/2014, 8:43 AM   Note: This office note was prepared with Estate agent. Any transcriptional errors that result from this process are unintentional.

## 2014-12-29 NOTE — Patient Instructions (Signed)
Please check blood sugars at least half the time about 2 hours after any meal and 3 times per week on waking up.   Please bring blood sugar diary to each visit. Recommended blood sugar levels about 2 hours after meal is 140-160 and on waking up 90-130

## 2015-02-17 ENCOUNTER — Encounter: Payer: Self-pay | Admitting: Family Medicine

## 2015-02-17 ENCOUNTER — Ambulatory Visit (INDEPENDENT_AMBULATORY_CARE_PROVIDER_SITE_OTHER): Payer: Managed Care, Other (non HMO) | Admitting: Family Medicine

## 2015-02-17 VITALS — BP 149/86 | HR 80 | Temp 99.7°F | Ht 67.0 in | Wt 202.0 lb

## 2015-02-17 DIAGNOSIS — K0889 Other specified disorders of teeth and supporting structures: Secondary | ICD-10-CM

## 2015-02-17 DIAGNOSIS — K088 Other specified disorders of teeth and supporting structures: Secondary | ICD-10-CM | POA: Diagnosis not present

## 2015-02-17 NOTE — Progress Notes (Signed)
   Subjective:    Patient ID: Rebecca Cook, female    DOB: Jun 14, 1956, 59 y.o.   MRN: [id number]  HPI Here for several days of pain in the left jaw, left ear., and left side of the face. No sinus congestion or ST or cough. 3 weeks ago she had some dental work done and a crown replaced in the left lower jaw area.    Review of Systems  Constitutional: Negative.   HENT: Positive for dental problem and ear pain. Negative for congestion, facial swelling, postnasal drip and sinus pressure.   Eyes: Negative.   Respiratory: Negative.        Objective:   Physical Exam  Constitutional: She appears well-developed and well-nourished.  HENT:  Right Ear: External ear normal.  Left Ear: External ear normal.  Nose: Nose normal.  Mouth/Throat: Oropharynx is clear and moist.  Tender over the left lower jaw area, the left TMJ is not involved  Eyes: Conjunctivae are normal.  Neck: Neck supple.  Lymphadenopathy:    She has no cervical adenopathy.          Assessment & Plan:  This is dental pain. Advised her to take Motrin prn and to follow up with her dentist soon

## 2015-02-17 NOTE — Progress Notes (Signed)
Pre visit review using our clinic review tool, if applicable. No additional management support is needed unless otherwise documented below in the visit note. 

## 2015-04-27 ENCOUNTER — Other Ambulatory Visit: Payer: Managed Care, Other (non HMO)

## 2015-04-30 ENCOUNTER — Ambulatory Visit: Payer: Managed Care, Other (non HMO) | Admitting: Endocrinology

## 2015-06-22 ENCOUNTER — Other Ambulatory Visit: Payer: Self-pay | Admitting: Internal Medicine

## 2015-08-14 ENCOUNTER — Encounter (HOSPITAL_COMMUNITY): Payer: Self-pay | Admitting: Emergency Medicine

## 2015-08-14 ENCOUNTER — Emergency Department (HOSPITAL_COMMUNITY)
Admission: EM | Admit: 2015-08-14 | Discharge: 2015-08-14 | Disposition: A | Discharge status: Home / Self Care | Payer: Managed Care, Other (non HMO) | Source: Home / Self Care | Attending: Family Medicine | Admitting: Family Medicine

## 2015-08-14 DIAGNOSIS — W101XXS Fall (on)(from) sidewalk curb, sequela: Secondary | ICD-10-CM | POA: Diagnosis not present

## 2015-08-14 DIAGNOSIS — S01511A Laceration without foreign body of lip, initial encounter: Secondary | ICD-10-CM | POA: Diagnosis not present

## 2015-08-14 DIAGNOSIS — S00511A Abrasion of lip, initial encounter: Secondary | ICD-10-CM

## 2015-08-14 DIAGNOSIS — S0083XA Contusion of other part of head, initial encounter: Secondary | ICD-10-CM | POA: Diagnosis not present

## 2015-08-14 DIAGNOSIS — S161XXA Strain of muscle, fascia and tendon at neck level, initial encounter: Secondary | ICD-10-CM

## 2015-08-14 DIAGNOSIS — K0889 Other specified disorders of teeth and supporting structures: Secondary | ICD-10-CM

## 2015-08-14 DIAGNOSIS — T148XXA Other injury of unspecified body region, initial encounter: Secondary | ICD-10-CM

## 2015-08-14 DIAGNOSIS — M549 Dorsalgia, unspecified: Secondary | ICD-10-CM

## 2015-08-14 MED ORDER — NAPROXEN 375 MG PO TABS: 375.0000 mg | ORAL_TABLET | Freq: Two times a day (BID) | ORAL | Status: DC

## 2015-08-14 MED ORDER — TRAMADOL HCL 50 MG PO TABS: ORAL_TABLET | ORAL | Status: DC

## 2015-08-14 MED ORDER — DICLOFENAC SODIUM 1 % TD GEL: 1.0000 "application " | Freq: Four times a day (QID) | TRANSDERMAL | Status: DC

## 2015-08-14 NOTE — Discharge Instructions (Signed)
Abrasion An abrasion is a cut or scrape on the outer surface of your skin. An abrasion does not extend through all of the layers of your skin. It is important to care for your abrasion properly to prevent infection. CAUSES Most abrasions are caused by falling on or gliding across the ground or another surface. When your skin rubs on something, the outer and inner layer of skin rubs off.  SYMPTOMS A cut or scrape is the main symptom of this condition. The scrape may be bleeding, or it may appear red or pink. If there was an associated fall, there may be an underlying bruise. DIAGNOSIS An abrasion is diagnosed with a physical exam. TREATMENT Treatment for this condition depends on how large and deep the abrasion is. Usually, your abrasion will be cleaned with water and mild soap. This removes any dirt or debris that may be stuck. An antibiotic ointment may be applied to the abrasion to help prevent infection. A bandage (dressing) may be placed on the abrasion to keep it clean. You may also need a tetanus shot. HOME CARE INSTRUCTIONS Medicines  Take or apply medicines only as directed by your health care provider.  If you were prescribed an antibiotic ointment, finish all of it even if you start to feel better. Wound Care  Clean the wound with mild soap and water 2-3 times per day or as directed by your health care provider. Pat your wound dry with a clean towel. Do not rub it.  There are many different ways to close and cover a wound. Follow instructions from your health care provider about:  Wound care.  Dressing changes and removal.  Check your wound every day for signs of infection. Watch for:  Redness, swelling, or pain.  Fluid, blood, or pus. General Instructions  Keep the dressing dry as directed by your health care provider. Do not take baths, swim, use a hot tub, or do anything that would put your wound underwater until your health care provider approves.  If there is  swelling, raise (elevate) the injured area above the level of your heart while you are sitting or lying down.  Keep all follow-up visits as directed by your health care provider. This is important. SEEK MEDICAL CARE IF:  You received a tetanus shot and you have swelling, severe pain, redness, or bleeding at the injection site.  Your pain is not controlled with medicine.  You have increased redness, swelling, or pain at the site of your wound. SEEK IMMEDIATE MEDICAL CARE IF:  You have a red streak going away from your wound.  You have a fever.  You have fluid, blood, or pus coming from your wound.  You notice a bad smell coming from your wound or your dressing.   This information is not intended to replace advice given to you by your health care provider. Make sure you discuss any questions you have with your health care provider.   Document Released: 06/22/2005 Document Revised: 06/03/2015 Document Reviewed: 09/10/2014 Elsevier Interactive Patient Education 2016 [employer]  Back Pain, Adult Back pain is very common in adults.The cause of back pain is rarely dangerous and the pain often gets better over time.The cause of your back pain may not be known. Some common causes of back pain include:  Strain of the muscles or ligaments supporting the spine.  Wear and tear (degeneration) of the spinal disks.  Arthritis.  Direct injury to the back. For many people, back pain may return. Since back  pain is rarely dangerous, most people can learn to manage this condition on their own. HOME CARE INSTRUCTIONS Watch your back pain for any changes. The following actions may help to lessen any discomfort you are feeling:  Remain active. It is stressful on your back to sit or stand in one place for long periods of time. Do not sit, drive, or stand in one place for more than 30 minutes at a time. Take short walks on even surfaces as soon as you are able.Try to increase the length of time  you walk each day.  Exercise regularly as directed by your health care provider. Exercise helps your back heal faster. It also helps avoid future injury by keeping your muscles strong and flexible.  Do not stay in bed.Resting more than 1-2 days can delay your recovery.  Pay attention to your body when you bend and lift. The most comfortable positions are those that put less stress on your recovering back. Always use proper lifting techniques, including:  Bending your knees.  Keeping the load close to your body.  Avoiding twisting.  Find a comfortable position to sleep. Use a firm mattress and lie on your side with your knees slightly bent. If you lie on your back, put a pillow under your knees.  Avoid feeling anxious or stressed.Stress increases muscle tension and can worsen back pain.It is important to recognize when you are anxious or stressed and learn ways to manage it, such as with exercise.  Take medicines only as directed by your health care provider. Over-the-counter medicines to reduce pain and inflammation are often the most helpful.Your health care provider may prescribe muscle relaxant drugs.These medicines help dull your pain so you can more quickly return to your normal activities and healthy exercise.  Apply ice to the injured area:  Put ice in a plastic bag.  Place a towel between your skin and the bag.  Leave the ice on for 20 minutes, 2-3 times a day for the first 2-3 days. After that, ice and heat may be alternated to reduce pain and spasms.  Maintain a healthy weight. Excess weight puts extra stress on your back and makes it difficult to maintain good posture. SEEK MEDICAL CARE IF:  You have pain that is not relieved with rest or medicine.  You have increasing pain going down into the legs or buttocks.  You have pain that does not improve in one week.  You have night pain.  You lose weight.  You have a fever or chills. SEEK IMMEDIATE MEDICAL CARE IF:    You develop new bowel or bladder control problems.  You have unusual weakness or numbness in your arms or legs.  You develop nausea or vomiting.  You develop abdominal pain.  You feel faint.   This information is not intended to replace advice given to you by your health care provider. Make sure you discuss any questions you have with your health care provider.   Document Released: 09/12/2005 Document Revised: 10/03/2014 Document Reviewed: 01/14/2014 Elsevier Interactive Patient Education 2016 [place] A contusion is a deep bruise. Contusions are the result of a blunt injury to tissues and muscle fibers under the skin. The injury causes bleeding under the skin. The skin overlying the contusion may turn blue, purple, or yellow. Minor injuries will give you a painless contusion, but more severe contusions may stay painful and swollen for a few weeks.  CAUSES  This condition is usually caused by a blow, trauma,  or direct force to an area of the body. SYMPTOMS  Symptoms of this condition include:  Swelling of the injured area.  Pain and tenderness in the injured area.  Discoloration. The area may have redness and then turn blue, purple, or yellow. DIAGNOSIS  This condition is diagnosed based on a physical exam and medical history. An X-ray, CT scan, or MRI may be needed to determine if there are any associated injuries, such as broken bones (fractures). TREATMENT  Specific treatment for this condition depends on what area of the body was injured. In general, the best treatment for a contusion is resting, icing, applying pressure to (compression), and elevating the injured area. This is often called the RICE strategy. Over-the-counter anti-inflammatory medicines may also be recommended for pain control.  HOME CARE INSTRUCTIONS   Rest the injured area.  If directed, apply ice to the injured area:  Put ice in a plastic bag.  Place a towel between your skin and the  bag.  Leave the ice on for 20 minutes, 2-3 times per day.  If directed, apply light compression to the injured area using an elastic bandage. Make sure the bandage is not wrapped too tightly. Remove and reapply the bandage as directed by your health care provider.  If possible, raise (elevate) the injured area above the level of your heart while you are sitting or lying down.  Take over-the-counter and prescription medicines only as told by your health care provider. SEEK MEDICAL CARE IF:  Your symptoms do not improve after several days of treatment.  Your symptoms get worse.  You have difficulty moving the injured area. SEEK IMMEDIATE MEDICAL CARE IF:   You have severe pain.  You have numbness in a hand or foot.  Your hand or foot turns pale or cold.   This information is not intended to replace advice given to you by your health care provider. Make sure you discuss any questions you have with your health care provider.   Document Released: 06/22/2005 Document Revised: 06/03/2015 Document Reviewed: 01/28/2015 Elsevier Interactive Patient Education 2016 [place].  Facial or Scalp Contusion A facial or scalp contusion is a deep bruise on the face or head. Injuries to the face and head generally cause a lot of swelling, especially around the eyes. Contusions are the result of an injury that caused bleeding under the skin. The contusion may turn blue, purple, or yellow. Minor injuries will give you a painless contusion, but more severe contusions may stay painful and swollen for a few weeks.  CAUSES  A facial or scalp contusion is caused by a blunt injury or trauma to the face or head area.  SIGNS AND SYMPTOMS   Swelling of the injured area.   Discoloration of the injured area.   Tenderness, soreness, or pain in the injured area.  DIAGNOSIS  The diagnosis can be made by taking a medical history and doing a physical exam. An X-ray exam, CT scan, or MRI may be needed to  determine if there are any associated injuries, such as broken bones (fractures). TREATMENT  Often, the best treatment for a facial or scalp contusion is applying cold compresses to the injured area. Over-the-counter medicines may also be recommended for pain control.  HOME CARE INSTRUCTIONS   Only take over-the-counter or prescription medicines as directed by your health care provider.   Apply ice to the injured area.   Put ice in a plastic bag.   Place a towel between your skin and  the bag.   Leave the ice on for 20 minutes, 2-3 times a day.  SEEK MEDICAL CARE IF:  You have bite problems.   You have pain with chewing.   You are concerned about facial defects. SEEK IMMEDIATE MEDICAL CARE IF:  You have severe pain or a headache that is not relieved by medicine.   You have unusual sleepiness, confusion, or personality changes.   You throw up (vomit).   You have a persistent nosebleed.   You have double vision or blurred vision.   You have fluid drainage from your nose or ear.   You have difficulty walking or using your arms or legs.  MAKE SURE YOU:   Understand these instructions.  Will watch your condition.  Will get help right away if you are not doing well or get worse.   This information is not intended to replace advice given to you by your health care provider. Make sure you discuss any questions you have with your health care provider.   Document Released: 10/20/2004 Document Revised: 10/03/2014 Document Reviewed: 04/25/2013 Elsevier Interactive Patient Education 2016 [place].  Mouth Laceration A mouth laceration is a deep cut in the lining of your mouth (mucosa). The laceration may extend into your lip or go all of the way through your mouth and cheek. Lacerations inside your mouth may involve your tongue, the insides of your cheeks, or the upper surface of your mouth (palate). Mouth lacerations may bleed a lot because your mouth has a  very rich blood supply. Mouth lacerations may need to be repaired with stitches (sutures). CAUSES Any type of facial injury can cause a mouth laceration. Common causes include:  Getting hit in the mouth.  Being in a car accident. SYMPTOMS The most common sign of a mouth laceration is bleeding that fills the mouth. DIAGNOSIS Your health care provider can diagnose a mouth laceration by examining your mouth. Your mouth may need to be washed out (irrigated) with a sterile salt-water (saline) solution. Your health care provider may also have to remove any blood clots to determine how bad your injury is. You may need X-rays of the bones in your jaw or your face to rule out other injuries, such as dental injuries, facial fractures, or jaw fractures. TREATMENT Treatment depends on the location and severity of your injury. Small mouth lacerations may not need treatment if bleeding has stopped. You may need sutures if:  You have a tongue laceration.  Your mouth laceration is large or deep, or it continues to bleed. If sutures are necessary, your health care provider will use absorbable sutures that dissolve as your body heals. You may also receive antibiotic medicine or a tetanus shot. HOME CARE INSTRUCTIONS  Take medicines only as directed by your health care provider.  If you were prescribed an antibiotic medicine, finish all of it even if you start to feel better.  Eat as directed by your health care provider. You may only be able to drink liquids or eat soft foods for a few days.  Rinse your mouth with a warm, salt-water rinse 4-6 times per day or as directed by your health care provider. You can make a salt-water rinse by mixing one tsp of salt into two cups of warm water.  Do not poke the sutures with your tongue. Doing that can loosen them.  Check your wound every day for signs of infection. It is normal to have a white or gray patch over your wound while it  heals. Watch  for:  Redness.  Swelling.  Blood or pus.  Maintain regular oral hygiene, if possible. Gently brush your teeth with a soft, nylon-bristled toothbrush 2 times per day.  Keep all follow-up visits as directed by your health care provider. This is important. SEEK MEDICAL CARE IF:  You were given a tetanus shot and have swelling, severe pain, redness, or bleeding at the injection site.  You have a fever.  Your pain is not controlled with medicine.  You have redness, swelling, or pain at your wound that is getting worse.  You have fresh bleeding or pus coming from your wound.  The edges of your wound break open.  You develop swollen, tender glands in your throat. SEEK IMMEDIATE MEDICAL CARE IF:   Your face or the area under your jaw becomes swollen.  You have trouble breathing or swallowing.   This information is not intended to replace advice given to you by your health care provider. Make sure you discuss any questions you have with your health care provider.   Document Released: 09/12/2005 Document Revised: 01/27/2015 Document Reviewed: 09/03/2014 Elsevier Interactive Patient Education 2016 [place].  Muscle Strain A muscle strain is an injury that occurs when a muscle is stretched beyond its normal length. Usually a small number of muscle fibers are torn when this happens. Muscle strain is rated in degrees. First-degree strains have the least amount of muscle fiber tearing and pain. Second-degree and third-degree strains have increasingly more tearing and pain.  Usually, recovery from muscle strain takes 1-2 weeks. Complete healing takes 5-6 weeks.  CAUSES  Muscle strain happens when a sudden, violent force placed on a muscle stretches it too far. This may occur with lifting, sports, or a fall.  RISK FACTORS Muscle strain is especially common in athletes.  SIGNS AND SYMPTOMS At the site of the muscle strain, there may be:  Pain.  Bruising.  Swelling.  Difficulty  using the muscle due to pain or lack of normal function. DIAGNOSIS  Your health care provider will perform a physical exam and ask about your medical history. TREATMENT  Often, the best treatment for a muscle strain is resting, icing, and applying cold compresses to the injured area.  HOME CARE INSTRUCTIONS   Use the PRICE method of treatment to promote muscle healing during the first 2-3 days after your injury. The PRICE method involves:  Protecting the muscle from being injured again.  Restricting your activity and resting the injured body part.  Icing your injury. To do this, put ice in a plastic bag. Place a towel between your skin and the bag. Then, apply the ice and leave it on from 15-20 minutes each hour. After the third day, switch to moist heat packs.  Apply compression to the injured area with a splint or elastic bandage. Be careful not to wrap it too tightly. This may interfere with blood circulation or increase swelling.  Elevate the injured body part above the level of your heart as often as you can.  Only take over-the-counter or prescription medicines for pain, discomfort, or fever as directed by your health care provider.  Warming up prior to exercise helps to prevent future muscle strains. SEEK MEDICAL CARE IF:   You have increasing pain or swelling in the injured area.  You have numbness, tingling, or a significant loss of strength in the injured area. MAKE SURE YOU:   Understand these instructions.  Will watch your condition.  Will get help right away  if you are not doing well or get worse.   This information is not intended to replace advice given to you by your health care provider. Make sure you discuss any questions you have with your health care provider.   Document Released: 09/12/2005 Document Revised: 07/03/2013 Document Reviewed: 04/11/2013 Elsevier Interactive Patient Education [employer].
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
## 2015-11-16 ENCOUNTER — Telehealth: Payer: Self-pay | Admitting: Internal Medicine

## 2015-11-16 NOTE — Telephone Encounter (Signed)
Needs visit for any medications for safety. Can schedule a 15 minute acute for meds only then can keep transfer visit in March.

## 2015-11-16 NOTE — Telephone Encounter (Signed)
Pt came in to schedule a transfer from [place].  The soonest I was able to get her in is the 2nd week in March. She has not been taking her medicine for roughly 6 months.  She knows her bp is up. The medicine she takes is Invokana, Janumet XR and Losartin HTC Do you want to call these prescriptions in till she can come in or should we wait  Her pharmacy is the [facility]  Please advise. If you send some in I will call and see what her local pharmacy is.

## 2015-11-16 NOTE — Telephone Encounter (Signed)
Pt informed

## 2015-11-19 ENCOUNTER — Ambulatory Visit (INDEPENDENT_AMBULATORY_CARE_PROVIDER_SITE_OTHER): Payer: Managed Care, Other (non HMO) | Admitting: Family

## 2015-11-19 ENCOUNTER — Other Ambulatory Visit (INDEPENDENT_AMBULATORY_CARE_PROVIDER_SITE_OTHER): Payer: Managed Care, Other (non HMO)

## 2015-11-19 ENCOUNTER — Encounter: Payer: Self-pay | Admitting: Family

## 2015-11-19 ENCOUNTER — Telehealth: Payer: Self-pay | Admitting: Family

## 2015-11-19 VITALS — BP 170/110 | HR 92 | Temp 98.4°F | Resp 16 | Ht 67.0 in | Wt 204.8 lb

## 2015-11-19 DIAGNOSIS — IMO0001 Reserved for inherently not codable concepts without codable children: Secondary | ICD-10-CM

## 2015-11-19 DIAGNOSIS — F418 Other specified anxiety disorders: Secondary | ICD-10-CM | POA: Diagnosis not present

## 2015-11-19 DIAGNOSIS — I10 Essential (primary) hypertension: Secondary | ICD-10-CM

## 2015-11-19 DIAGNOSIS — E1165 Type 2 diabetes mellitus with hyperglycemia: Secondary | ICD-10-CM | POA: Diagnosis not present

## 2015-11-19 DIAGNOSIS — F419 Anxiety disorder, unspecified: Secondary | ICD-10-CM

## 2015-11-19 DIAGNOSIS — F329 Major depressive disorder, single episode, unspecified: Secondary | ICD-10-CM | POA: Insufficient documentation

## 2015-11-19 LAB — BASIC METABOLIC PANEL
BUN: 13 mg/dL (ref 6–23)
CO2: 27 mEq/L (ref 19–32)
Calcium: 9.5 mg/dL (ref 8.4–10.5)
Chloride: 97 mEq/L (ref 96–112)
Creatinine, Ser: 0.74 mg/dL (ref 0.40–1.20)
GFR: 84.75 mL/min (ref >60.00)
Glucose, Bld: 431 mg/dL — ABNORMAL HIGH (ref 70–99)
Potassium: 4.4 mEq/L (ref 3.5–5.1)
Sodium: 132 mEq/L — ABNORMAL LOW (ref 135–145)

## 2015-11-19 LAB — HEMOGLOBIN A1C: Hgb A1c MFr Bld: 11 % — ABNORMAL HIGH (ref 4.6–6.5)

## 2015-11-19 MED ORDER — LOSARTAN POTASSIUM-HCTZ 100-25 MG PO TABS: 1.0000 | ORAL_TABLET | Freq: Every day | ORAL | Status: DC

## 2015-11-19 MED ORDER — DAPAGLIFLOZIN PROPANEDIOL 10 MG PO TABS: 5.0000 mg | ORAL_TABLET | Freq: Every day | ORAL | Status: DC

## 2015-11-19 MED ORDER — CITALOPRAM HYDROBROMIDE 20 MG PO TABS: 20.0000 mg | ORAL_TABLET | Freq: Every day | ORAL | Status: DC

## 2015-11-19 NOTE — Assessment & Plan Note (Signed)
Symptoms consistent with anxiety and depression exacerbation secondary to life and work stressors. Previously maintained on citalopram which she has not taken in several months. Discussed risks and benefits of restarting medication. She wishes to restart citalopram at this time and hold off on additional medications. Denies suicidal ideations. Follow-up with PCP as scheduled in 2 weeks.

## 2015-11-19 NOTE — Assessment & Plan Note (Signed)
Blood pressure remains above goal 140/90 with current regimen secondary to patient  Not taking the medication for several months. Reports headaches which are most likely associated with increasing blood pressure. Restart losartan-hydrochlorothiazide. Encouraged to monitor blood pressure at home. Continue physical activity and followed Dash eating plan. Follow-up for worst headache of her life or with PCP as scheduled.

## 2015-11-19 NOTE — Assessment & Plan Note (Signed)
Diabetes with unknown current status and not maintained on medication currently secondary to cost. Obtain A1c and basic metabolic panel. Sample of [place] provided to bridge to new PCP.  Monitor blood pressure at home as able. Follow-up with new PCP in 2 weeks.

## 2015-11-19 NOTE — Patient Instructions (Addendum)
Thank you for choosing [facility].  Summary/Instructions:  Your prescription(s) have been submitted to your pharmacy or been printed and provided for you. Please take as directed and contact our office if you believe you are having problem(s) with the medication(s) or have any questions.  Please stop by the lab on the basement level of the building for your blood work. Your results will be released to MyChart (or called to you) after review, usually within 72 hours after test completion. If any changes need to be made, you will be notified at that same time.  If your symptoms worsen or fail to improve, please contact our office for further instruction, or in case of emergency go directly to the emergency room at the closest medical facility.    Generalized Anxiety Disorder Generalized anxiety disorder (GAD) is a mental disorder. It interferes with life functions, including relationships, work, and school. GAD is different from normal anxiety, which everyone experiences at some point in their lives in response to specific life events and activities. Normal anxiety actually helps [place] prepare for and get through these life events and activities. Normal anxiety goes away after the event or activity is over.  GAD causes anxiety that is not necessarily related to specific events or activities. It also causes excess anxiety in proportion to specific events or activities. The anxiety associated with GAD is also difficult to control. GAD can vary from mild to severe. People with severe GAD can have intense waves of anxiety with physical symptoms (panic attacks).  SYMPTOMS The anxiety and worry associated with GAD are difficult to control. This anxiety and worry are related to many life events and activities and also occur more days than not for 6 months or longer. People with GAD also have three or more of the following symptoms (one or more in children):  Restlessness.   Fatigue.  Difficulty  concentrating.   Irritability.  Muscle tension.  Difficulty sleeping or unsatisfying sleep. DIAGNOSIS GAD is diagnosed through an assessment by your health care provider. Your health care provider will ask you questions aboutyour mood,physical symptoms, and events in your life. Your health care provider may ask you about your medical history and use of alcohol or drugs, including prescription medicines. Your health care provider may also do a physical exam and blood tests. Certain medical conditions and the use of certain substances can cause symptoms similar to those associated with GAD. Your health care provider may refer you to a mental health specialist for further evaluation. TREATMENT The following therapies are usually used to treat GAD:   Medication. Antidepressant medication usually is prescribed for long-term daily control. Antianxiety medicines may be added in severe cases, especially when panic attacks occur.   Talk therapy (psychotherapy). Certain types of talk therapy can be helpful in treating GAD by providing support, education, and guidance. A form of talk therapy called cognitive behavioral therapy can teach you healthy ways to think about and react to daily life events and activities.  Stress managementtechniques. These include yoga, meditation, and exercise and can be very helpful when they are practiced regularly. A mental health specialist can help determine which treatment is best for you. Some people see improvement with one therapy. However, other people require a combination of therapies.   This information is not intended to replace advice given to you by your health care provider. Make sure you discuss any questions you have with your health care provider.   Document Released: 01/07/2013 Document Revised: 10/03/2014 Document Reviewed:  01/07/2013 Elsevier Interactive Patient Education 2016 [place] and Stress Management Stress is a normal  reaction to life events. It is what you feel when life demands more than you are used to or more than you can handle. Some stress can be useful. For example, the stress reaction can help you catch the last bus of the day, study for a test, or meet a deadline at work. But stress that occurs too often or for too long can cause problems. It can affect your emotional health and interfere with relationships and normal daily activities. Too much stress can weaken your immune system and increase your risk for physical illness. If you already have a medical problem, stress can make it worse. CAUSES  All sorts of life events may cause stress. An event that causes stress for one person may not be stressful for another person. Major life events commonly cause stress. These may be positive or negative. Examples include losing your job, moving into a new home, getting married, having a baby, or losing a loved one. Less obvious life events may also cause stress, especially if they occur day after day or in combination. Examples include working long hours, driving in traffic, caring for children, being in debt, or being in a difficult relationship. SIGNS AND SYMPTOMS Stress may cause emotional symptoms including, the following:  Anxiety. This is feeling worried, afraid, on edge, overwhelmed, or out of control.  Anger. This is feeling irritated or impatient.  Depression. This is feeling sad, down, helpless, or guilty.  Difficulty focusing, remembering, or making decisions. Stress may cause physical symptoms, including the following:   Aches and pains. These may affect your head, neck, back, stomach, or other areas of your body.  Tight muscles or clenched jaw.  Low energy or trouble sleeping. Stress may cause unhealthy behaviors, including the following:   Eating to feel better (overeating) or skipping meals.  Sleeping too little, too much, or both.  Working too much or putting off tasks  (procrastination).  Smoking, drinking alcohol, or using drugs to feel better. DIAGNOSIS  Stress is diagnosed through an assessment by your health care provider. Your health care provider will ask questions about your symptoms and any stressful life events.Your health care provider will also ask about your medical history and may order blood tests or other tests. Certain medical conditions and medicine can cause physical symptoms similar to stress. Mental illness can cause emotional symptoms and unhealthy behaviors similar to stress. Your health care provider may refer you to a mental health professional for further evaluation.  TREATMENT  Stress management is the recommended treatment for stress.The goals of stress management are reducing stressful life events and coping with stress in healthy ways.  Techniques for reducing stressful life events include the following:  Stress identification. Self-monitor for stress and identify what causes stress for you. These skills may help you to avoid some stressful events.  Time management. Set your priorities, keep a calendar of events, and learn to say "no." These tools can help you avoid making too many commitments. Techniques for coping with stress include the following:  Rethinking the problem. Try to think realistically about stressful events rather than ignoring them or overreacting. Try to find the positives in a stressful situation rather than focusing on the negatives.  Exercise. Physical exercise can release both physical and emotional tension. The key is to find a form of exercise you enjoy and do it regularly.  Relaxation techniques. These relax the body  and mind. Examples include yoga, meditation, tai chi, biofeedback, deep breathing, progressive muscle relaxation, listening to music, being out in nature, journaling, and other hobbies. Again, the key is to find one or more that you enjoy and can do regularly.  Healthy lifestyle. Eat a  balanced diet, get plenty of sleep, and do not smoke. Avoid using alcohol or drugs to relax.  Strong support network. Spend time with family, friends, or other people you enjoy being around.Express your feelings and talk things over with someone you trust. Counseling or talktherapy with a mental health professional may be helpful if you are having difficulty managing stress on your own. Medicine is typically not recommended for the treatment of stress.Talk to your health care provider if you think you need medicine for symptoms of stress. HOME CARE INSTRUCTIONS  Keep all follow-up visits as directed by your health care provider.  Take all medicines as directed by your health care provider. SEEK MEDICAL CARE IF:  Your symptoms get worse or you start having new symptoms.  You feel overwhelmed by your problems and can no longer manage them on your own. SEEK IMMEDIATE MEDICAL CARE IF:  You feel like hurting yourself or someone else.   This information is not intended to replace advice given to you by your health care provider. Make sure you discuss any questions you have with your health care provider.   Document Released: 03/08/2001 Document Revised: 10/03/2014 Document Reviewed: 05/07/2013 Elsevier Interactive Patient Education [employer].

## 2015-11-19 NOTE — Telephone Encounter (Signed)
Please inform patient that her blood work shows that her A1c is 11.0 and her kidney function and electrolytes look okay. Please have her continue to the Farxiga at this time and may require additional medications or injections when she meets with Dr. Sharlet Salina.

## 2015-11-19 NOTE — Progress Notes (Signed)
Subjective:    Patient ID: Rebecca Cook, female    DOB: [birth year], 60 y.o.   MRN: [id number]  Chief Complaint  Patient presents with  . Hypertension    need refill of BP meds, she has not had her medications in a while due to not being able to afford them but she is wanting to get her BP under control she has family issues going on which is causing them to spike    HPI:  Rebecca Cook is a 60 y.o. female who  has a past medical history of Frequent headaches; Diabetes mellitus, type II ([place]); GERD (gastroesophageal reflux disease); Hypertension; and Kidney stones. and presents today for an acute office visit.  1.) Hypertension -   Currently prescribed losartan-hydrochlorothiazide. Reports not having taken the medication recently as she has been out of it secondary to cost. Has the associated symptoms of headaches and increased pressure in her ears. She has vomited which did help some of her symptoms. No changes in vision. Expresses that she is also going through several life stressors.  BP Readings from Last 3 Encounters:  11/19/15 170/110  08/14/15 174/105  02/17/15 149/86    2.) Type 2 diabetes - Currently prescribed sitagliptin-metformin and canagliflozin. Has not taken the medication in several months secondary to cost. States she has been working to lower her blood sugar through lifestyle management.   Lab Results  Component Value Date   HGBA1C 6.8* 12/24/2014    3.) Anxiety and depression - Previously maintained on citalopram. Has not taken the medication in several months and has had several life events and stressors that have exacerbated her symptoms. Severity of the symptoms are enough to disturb her sleep pattern and effect her daily life. Does not recall if the citalopram and if it helped or not. Modifying factors are working with her EAP which have also recommended that she be on medication. No suicidal ideations presently.   No Known Allergies   Current Outpatient  Prescriptions on File Prior to Visit  Medication Sig Dispense Refill  . Aspirin-Acetaminophen-Caffeine (EXCEDRIN MIGRAINE PO) Take by mouth as needed. OTC    . atorvastatin (LIPITOR) 40 MG tablet TAKE 1 TABLET BY MOUTH DAILY 90 tablet 0  . canagliflozin (INVOKANA) 300 MG TABS tablet Take 300 mg by mouth daily. Pt will need office visit before further refills. 30 tablet 2  . diclofenac sodium (VOLTAREN) 1 % GEL Apply 1 application topically 4 (four) times daily. 100 g 0  . gabapentin (NEURONTIN) 100 MG capsule TAKE 1 CAPSULE BY MOUTH EVERY EIGHT HOURS AS NEEDED 180 capsule 0  . glucose blood (FREESTYLE TEST STRIPS) test strip Use as instructed 100 each 12  . glucose monitoring kit (FREESTYLE) monitoring kit 1 each by Does not apply route as needed for other. 2 each 0  . Homeopathic Products (YEAST-GARD ADV HOMEOPATHIC PO) Take by mouth. OTC    . Lancets (FREESTYLE) lancets Use as instructed 100 each 12  . Loratadine (CLARITIN PO) Take by mouth. OTC    . Multiple Vitamins-Minerals (MULTIVITAMIN PO) Take by mouth daily. OTC    . SitaGLIPtin-MetFORMIN HCl (JANUMET XR) 50-1000 MG TB24 TAKE 1 TABLET BY MOUTH TWICE A DAY BEFORE LUNCH AND SUPPER 180 tablet 1   No current facility-administered medications on file prior to visit.     Past Surgical History  Procedure Laterality Date  . Tubal ligation  1985  . Tonsillectomy and adenoidectomy  1985    Review of Systems  Constitutional:  Negative for fever and chills.  Neurological: Positive for headaches.  Psychiatric/Behavioral: Positive for sleep disturbance. Negative for suicidal ideas. The patient is nervous/anxious.       Objective:    BP 170/110 mmHg  Pulse 92  Temp(Src) 98.4 F (36.9 C) (Oral)  Resp 16  Ht '5\' 7"'$  (1.702 m)  Wt 204 lb 12.8 oz (92.897 kg)  BMI 32.07 kg/m2  SpO2 97% Nursing note and vital signs reviewed.  Physical Exam  Constitutional: She is oriented to person, place, and time. She appears well-developed and  well-nourished. No distress.  Eyes: Conjunctivae and EOM are normal. Pupils are equal, round, and reactive to light.  Cardiovascular: Normal rate, regular rhythm, normal heart sounds and intact distal pulses.   Pulmonary/Chest: Effort normal and breath sounds normal.  Neurological: She is alert and oriented to person, place, and time. No cranial nerve deficit.  Skin: Skin is warm and dry.  Psychiatric: She has a normal mood and affect. Her behavior is normal. Judgment and thought content normal.       Assessment & Plan:   Problem List Items Addressed This Visit      Cardiovascular and Mediastinum   HTN (hypertension) - Primary     Blood pressure remains above goal 140/90 with current regimen secondary to patient  Not taking the medication for several months. Reports headaches which are most likely associated with increasing blood pressure. Restart losartan-hydrochlorothiazide. Encouraged to monitor blood pressure at home. Continue physical activity and followed Dash eating plan. Follow-up for worst headache of her life or with PCP as scheduled.      Relevant Medications   losartan-hydrochlorothiazide (HYZAAR) 100-25 MG tablet   Other Relevant Orders   Hemoglobin B8M   Basic Metabolic Panel (BMET)     Other   Uncontrolled diabetes mellitus ([place])     Diabetes with unknown current status and not maintained on medication currently secondary to cost. Obtain A1c and basic metabolic panel. Sample of [place] provided to bridge to new PCP.  Monitor blood pressure at home as able. Follow-up with new PCP in 2 weeks.      Relevant Medications   losartan-hydrochlorothiazide (HYZAAR) 100-25 MG tablet   Other Relevant Orders   Hemoglobin L5Q   Basic Metabolic Panel (BMET)   Anxiety and depression     Symptoms consistent with anxiety and depression exacerbation secondary to life and work stressors. Previously maintained on citalopram which she has not taken in several months. Discussed risks and  benefits of restarting medication. She wishes to restart citalopram at this time and hold off on additional medications. Denies suicidal ideations. Follow-up with PCP as scheduled in 2 weeks.      Relevant Medications   citalopram (CELEXA) 20 MG tablet

## 2015-11-23 NOTE — Telephone Encounter (Signed)
Pt is aware of results. 

## 2015-12-01 ENCOUNTER — Telehealth: Payer: Self-pay | Admitting: Internal Medicine

## 2015-12-01 ENCOUNTER — Ambulatory Visit: Payer: Managed Care, Other (non HMO) | Admitting: Internal Medicine

## 2015-12-01 DIAGNOSIS — E1165 Type 2 diabetes mellitus with hyperglycemia: Principal | ICD-10-CM

## 2015-12-01 DIAGNOSIS — IMO0001 Reserved for inherently not codable concepts without codable children: Secondary | ICD-10-CM

## 2015-12-01 MED ORDER — DAPAGLIFLOZIN PROPANEDIOL 10 MG PO TABS: 5.0000 mg | ORAL_TABLET | Freq: Every day | ORAL | Status: DC

## 2015-12-01 NOTE — Telephone Encounter (Signed)
She does not need an establish appointment as I saw her last week. Her medication has been refilled.

## 2015-12-01 NOTE — Telephone Encounter (Signed)
Pt thought her transfer from [facility] appt was at 11:15 so she was late for this appt. I couldn't get her back in to see her till 3/24. She only has 2 pills left for the dapagliflozin propanediol (FARXIGA) 10 MG TABS tablet [id number]  Can you call her in some more till she gets back in here. [facility] in [place]

## 2015-12-01 NOTE — Telephone Encounter (Signed)
Please advise 

## 2015-12-02 NOTE — Telephone Encounter (Signed)
Left msg on vm to call back regarding her next appt. She has an transfer appt with Dr. Sharlet Salina. Per Greg's note below, wanting to see if she wants to stay with Marya Amsler as her PCP.

## 2015-12-07 ENCOUNTER — Ambulatory Visit (INDEPENDENT_AMBULATORY_CARE_PROVIDER_SITE_OTHER): Payer: Managed Care, Other (non HMO) | Admitting: Family

## 2015-12-07 ENCOUNTER — Encounter: Payer: Self-pay | Admitting: Family

## 2015-12-07 VITALS — BP 152/88 | HR 95 | Temp 98.0°F | Resp 16 | Ht 67.0 in | Wt 201.0 lb

## 2015-12-07 DIAGNOSIS — F418 Other specified anxiety disorders: Secondary | ICD-10-CM | POA: Diagnosis not present

## 2015-12-07 DIAGNOSIS — IMO0001 Reserved for inherently not codable concepts without codable children: Secondary | ICD-10-CM

## 2015-12-07 DIAGNOSIS — F32A Depression, unspecified: Secondary | ICD-10-CM

## 2015-12-07 DIAGNOSIS — F329 Major depressive disorder, single episode, unspecified: Secondary | ICD-10-CM

## 2015-12-07 DIAGNOSIS — F419 Anxiety disorder, unspecified: Secondary | ICD-10-CM

## 2015-12-07 DIAGNOSIS — E1165 Type 2 diabetes mellitus with hyperglycemia: Secondary | ICD-10-CM

## 2015-12-07 DIAGNOSIS — I10 Essential (primary) hypertension: Secondary | ICD-10-CM

## 2015-12-07 MED ORDER — LOSARTAN POTASSIUM-HCTZ 100-25 MG PO TABS: 1.0000 | ORAL_TABLET | Freq: Every day | ORAL | Status: DC

## 2015-12-07 MED ORDER — CITALOPRAM HYDROBROMIDE 20 MG PO TABS: 20.0000 mg | ORAL_TABLET | Freq: Every day | ORAL | Status: DC

## 2015-12-07 MED ORDER — CANAGLIFLOZIN 100 MG PO TABS: 100.0000 mg | ORAL_TABLET | Freq: Every day | ORAL | Status: DC

## 2015-12-07 MED ORDER — SITAGLIP PHOS-METFORMIN HCL ER 50-1000 MG PO TB24: ORAL_TABLET | ORAL | Status: DC

## 2015-12-07 NOTE — Progress Notes (Signed)
Subjective:    Patient ID: Rebecca Cook, female    DOB: [birth year], 60 y.o.   MRN: [id number]  Chief Complaint  Patient presents with  . Follow-up    states that she is here to talk about lab work and medications    HPI:  Rebecca Cook is a 60 y.o. female who  has a past medical history of Frequent headaches; Diabetes mellitus, type II ([place]); GERD (gastroesophageal reflux disease); Hypertension; and Kidney stones. and presents today for a follow up office visit.  1.) Type 2 diabetes - Previous A1c of 11.0 and started on Farxiga during most recent office visit. Reports taking the medication as prescribed and denies adverse side effects. Indicates she feels improved since starting medication and notes that her blood home blood sugars have been down to 170.  Lab Results  Component Value Date   HGBA1C 11.0* 11/19/2015    2.) Hypertension - Currently maintained on losartan-hydrochlorothiazide. Reports taking the medication as prescribed and denies adverse side effects or cough. No hypotensive readings. Blood pressure at home has been below 140/90 on average. Denies symptoms of end organ damage.  BP Readings from Last 3 Encounters:  12/07/15 152/88  11/19/15 170/110  08/14/15 174/105   3.) Anxiety and Depression - Currently maintained on citalopram. Reports taken the medications as prescribed and denies adverse side effects. No suicidal ideations. Reports that her mood is improved and her anxiety is well controlled with current dosage of medication.  No Known Allergies   Current Outpatient Prescriptions on File Prior to Visit  Medication Sig Dispense Refill  . Aspirin-Acetaminophen-Caffeine (EXCEDRIN MIGRAINE PO) Take by mouth as needed. OTC    . atorvastatin (LIPITOR) 40 MG tablet TAKE 1 TABLET BY MOUTH DAILY 90 tablet 0  . diclofenac sodium (VOLTAREN) 1 % GEL Apply 1 application topically 4 (four) times daily. 100 g 0  . gabapentin (NEURONTIN) 100 MG capsule TAKE 1 CAPSULE BY  MOUTH EVERY EIGHT HOURS AS NEEDED 180 capsule 0  . glucose blood (FREESTYLE TEST STRIPS) test strip Use as instructed 100 each 12  . glucose monitoring kit (FREESTYLE) monitoring kit 1 each by Does not apply route as needed for other. 2 each 0  . Homeopathic Products (YEAST-GARD ADV HOMEOPATHIC PO) Take by mouth. OTC    . Lancets (FREESTYLE) lancets Use as instructed 100 each 12  . Loratadine (CLARITIN PO) Take by mouth. OTC    . Multiple Vitamins-Minerals (MULTIVITAMIN PO) Take by mouth daily. OTC     No current facility-administered medications on file prior to visit.     Past Surgical History  Procedure Laterality Date  . Tubal ligation  1985  . Tonsillectomy and adenoidectomy  1985    Past Medical History  Diagnosis Date  . Frequent headaches   . Diabetes mellitus, type II ([place])   . GERD (gastroesophageal reflux disease)   . Hypertension   . Kidney stones     None current, last had kidney stones in 1986     Review of Systems  Constitutional: Negative for fever, chills and unexpected weight change.  Eyes:       Denies changes in vision  Respiratory: Negative for cough, chest tightness and stridor.   Cardiovascular: Negative for chest pain, palpitations and leg swelling.  Endocrine: Negative for polydipsia, polyphagia and polyuria.  Neurological: Negative for numbness and headaches.      Objective:    BP 152/88 mmHg  Pulse 95  Temp(Src) 98 F (36.7 C) (Oral)  Resp 16  Ht _0  (1.702 m)  Wt 201 lb (91.173 kg)  BMI 31.47 kg/m2  SpO2 97% Nursing note and vital signs reviewed.  Physical Exam  Constitutional: She is oriented to person, place, and time. She appears well-developed and well-nourished. No distress.  Cardiovascular: Normal rate, regular rhythm, normal heart sounds and intact distal pulses.   Pulmonary/Chest: Effort normal and breath sounds normal.  Neurological: She is alert and oriented to person, place, and time.  Skin: Skin is warm and dry.    Psychiatric: She has a normal mood and affect. Her behavior is normal. Judgment and thought content normal.       Assessment & Plan:   Problem List Items Addressed This Visit      Cardiovascular and Mediastinum   HTN (hypertension)    Hypertension remains slightly elevated above goal 140/90 with current regimen although home blood pressure readings are below 140/90 on average. Continue current dosage of losartan-hydrochlorothiazide. Continue to monitor blood pressure at home. Follow-up in 3 months with diabetes.      Relevant Medications   losartan-hydrochlorothiazide (HYZAAR) 100-25 MG tablet     Other   Uncontrolled diabetes mellitus ([place]) - Primary    Diabetes appears with improved control after starting medication. She recently ran out of the medication and would like to restart Invokana and Janumet XR. Discontinue Farxiga. Continue to monitor blood sugars at home. Follow up in 3 months for diabetes prevention and follow up A1c.       Relevant Medications   SitaGLIPtin-MetFORMIN HCl (JANUMET XR) 50-1000 MG TB24   canagliflozin (INVOKANA) 100 MG TABS tablet   losartan-hydrochlorothiazide (HYZAAR) 100-25 MG tablet   Anxiety and depression    Anxiety and depression was significantly improved control since starting citalopram. No adverse side effects. Continue current dosage of citalopram. Follow-up in 3 months or sooner if needed.      Relevant Medications   citalopram (CELEXA) 20 MG tablet

## 2015-12-07 NOTE — Patient Instructions (Signed)
Thank you for choosing [facility].  Summary/Instructions:  Your prescription(s) have been submitted to your pharmacy or been printed and provided for you. Please take as directed and contact our office if you believe you are having problem(s) with the medication(s) or have any questions.  If your symptoms worsen or fail to improve, please contact our office for further instruction, or in case of emergency go directly to the emergency room at the closest medical facility.   Please continue to take her medication as prescribed.

## 2015-12-07 NOTE — Assessment & Plan Note (Signed)
Hypertension remains slightly elevated above goal 140/90 with current regimen although home blood pressure readings are below 140/90 on average. Continue current dosage of losartan-hydrochlorothiazide. Continue to monitor blood pressure at home. Follow-up in 3 months with diabetes.

## 2015-12-07 NOTE — Assessment & Plan Note (Signed)
Diabetes appears with improved control after starting medication. She recently ran out of the medication and would like to restart Invokana and Janumet XR. Discontinue Farxiga. Continue to monitor blood sugars at home. Follow up in 3 months for diabetes prevention and follow up A1c.

## 2015-12-07 NOTE — Assessment & Plan Note (Signed)
Anxiety and depression was significantly improved control since starting citalopram. No adverse side effects. Continue current dosage of citalopram. Follow-up in 3 months or sooner if needed.

## 2015-12-18 ENCOUNTER — Ambulatory Visit: Payer: Managed Care, Other (non HMO) | Admitting: Internal Medicine

## 2016-03-28 ENCOUNTER — Other Ambulatory Visit: Payer: Self-pay | Admitting: Family

## 2016-03-30 ENCOUNTER — Telehealth: Payer: Self-pay

## 2016-03-30 NOTE — Telephone Encounter (Signed)
PA initiated via CoverMyMeds key [id number]

## 2016-03-31 NOTE — Telephone Encounter (Signed)
APPROVED, pharmacy to be notified via CoverMyMeds

## 2016-05-01 ENCOUNTER — Other Ambulatory Visit: Payer: Self-pay | Admitting: Family

## 2016-05-23 ENCOUNTER — Other Ambulatory Visit: Payer: Self-pay | Admitting: *Deleted

## 2016-05-23 MED ORDER — CANAGLIFLOZIN 100 MG PO TABS: 100.0000 mg | ORAL_TABLET | Freq: Every day | ORAL | 2 refills | Status: DC

## 2016-05-24 ENCOUNTER — Other Ambulatory Visit: Payer: Self-pay | Admitting: *Deleted

## 2016-05-24 MED ORDER — LOSARTAN POTASSIUM-HCTZ 100-25 MG PO TABS: 1.0000 | ORAL_TABLET | Freq: Every day | ORAL | 1 refills | Status: DC

## 2016-05-26 ENCOUNTER — Other Ambulatory Visit: Payer: Self-pay | Admitting: Family

## 2016-05-27 NOTE — Telephone Encounter (Signed)
Error

## 2016-05-30 ENCOUNTER — Other Ambulatory Visit: Payer: Self-pay | Admitting: Family

## 2016-06-22 ENCOUNTER — Other Ambulatory Visit: Payer: Self-pay

## 2016-06-22 DIAGNOSIS — F419 Anxiety disorder, unspecified: Secondary | ICD-10-CM

## 2016-06-22 DIAGNOSIS — F32A Depression, unspecified: Secondary | ICD-10-CM

## 2016-06-22 DIAGNOSIS — F329 Major depressive disorder, single episode, unspecified: Secondary | ICD-10-CM

## 2016-06-22 MED ORDER — CITALOPRAM HYDROBROMIDE 20 MG PO TABS: 20.0000 mg | ORAL_TABLET | Freq: Every day | ORAL | 0 refills | Status: DC

## 2016-08-12 ENCOUNTER — Ambulatory Visit (INDEPENDENT_AMBULATORY_CARE_PROVIDER_SITE_OTHER): Payer: Managed Care, Other (non HMO) | Admitting: Family

## 2016-08-12 ENCOUNTER — Encounter: Payer: Self-pay | Admitting: Family

## 2016-08-12 DIAGNOSIS — M25511 Pain in right shoulder: Secondary | ICD-10-CM

## 2016-08-12 DIAGNOSIS — M7541 Impingement syndrome of right shoulder: Secondary | ICD-10-CM | POA: Insufficient documentation

## 2016-08-12 MED ORDER — SITAGLIP PHOS-METFORMIN HCL ER 50-1000 MG PO TB24: ORAL_TABLET | ORAL | 1 refills | Status: DC

## 2016-08-12 MED ORDER — NAPROXEN-ESOMEPRAZOLE 500-20 MG PO TBEC: 1.0000 | DELAYED_RELEASE_TABLET | Freq: Two times a day (BID) | ORAL | 0 refills | Status: DC | PRN

## 2016-08-12 MED ORDER — DICLOFENAC SODIUM 2 % TD SOLN: 1.0000 "application " | Freq: Two times a day (BID) | TRANSDERMAL | 1 refills | Status: DC | PRN

## 2016-08-12 NOTE — Assessment & Plan Note (Signed)
Right shoulder pain appears consistent with rotator cuff tendinitis or possible subacromial bursitis. Treat conservatively with ice and home exercise therapy. Start Vimovo and Pennsaid. If symptoms worsen or do not improve consider ultrasound imaging, cortisone injection, and possible physical therapy.

## 2016-08-12 NOTE — Progress Notes (Signed)
Subjective:    Patient ID: Rebecca Cook, female    DOB: [birth year], 60 y.o.   MRN: [id number]  Chief Complaint  Patient presents with  . Shoulder Pain    has right shoulder pain that goes into hand and fingers, has a place on face that she wants checked out that looks like a bite    HPI:  ZOIEE Cook is a 60 y.o. female who  has a past medical history of Diabetes mellitus, type II ([place]); Frequent headaches; GERD (gastroesophageal reflux disease); Hypertension; and Kidney stones. and presents today for a follow up office visit.    This is a new problem. Associated symptoms of pain located in her right shoulder and upper extremity have been going on for about 1 month and progressively worsening since initial onset. The pain is constant and worse with movement.Pain is sometimes sharp and sometimes dull.  Denies neck pain. No new trauma or injury. Expresses some weakness. She is right hand dominant. Modifying factors include heat, ice, and aspircream which have helped a little. She does have previous history of a fall and injury to the right shoulder however that had resolved completely with treatment.   No Known Allergies    Outpatient Medications Prior to Visit  Medication Sig Dispense Refill  . Aspirin-Acetaminophen-Caffeine (EXCEDRIN MIGRAINE PO) Take by mouth as needed. OTC    . atorvastatin (LIPITOR) 40 MG tablet TAKE 1 TABLET BY MOUTH DAILY 90 tablet 0  . canagliflozin (INVOKANA) 100 MG TABS tablet Take 1 tablet (100 mg total) by mouth daily before breakfast. 90 tablet 2  . citalopram (CELEXA) 20 MG tablet TAKE 1 TABLET (20 MG TOTAL) BY MOUTH DAILY. 30 tablet 0  . gabapentin (NEURONTIN) 100 MG capsule TAKE 1 CAPSULE BY MOUTH EVERY EIGHT HOURS AS NEEDED 180 capsule 0  . glucose blood (FREESTYLE TEST STRIPS) test strip Use as instructed 100 each 12  . glucose monitoring kit (FREESTYLE) monitoring kit 1 each by Does not apply route as needed for other. 2 each 0  . Homeopathic  Products (YEAST-GARD ADV HOMEOPATHIC PO) Take by mouth. OTC    . INVOKANA 300 MG TABS tablet TAKE 1 TABLET BY MOUTH DAILY 30 tablet 5  . Lancets (FREESTYLE) lancets Use as instructed 100 each 12  . Loratadine (CLARITIN PO) Take by mouth. OTC    . losartan-hydrochlorothiazide (HYZAAR) 100-25 MG tablet Take 1 tablet by mouth daily. 90 tablet 0  . losartan-hydrochlorothiazide (HYZAAR) 100-25 MG tablet Take 1 tablet by mouth daily. 90 tablet 1  . Multiple Vitamins-Minerals (MULTIVITAMIN PO) Take by mouth daily. OTC    . diclofenac sodium (VOLTAREN) 1 % GEL Apply 1 application topically 4 (four) times daily. 100 g 0  . SitaGLIPtin-MetFORMIN HCl (JANUMET XR) 50-1000 MG TB24 TAKE 1 TABLET BY MOUTH TWICE A DAY BEFORE LUNCH AND SUPPER 180 tablet 1  . citalopram (CELEXA) 20 MG tablet Take 1 tablet (20 mg total) by mouth daily. 90 tablet 0   No facility-administered medications prior to visit.       Past Surgical History:  Procedure Laterality Date  . TONSILLECTOMY AND ADENOIDECTOMY  1985  . TUBAL LIGATION  1985      Past Medical History:  Diagnosis Date  . Diabetes mellitus, type II ([place])   . Frequent headaches   . GERD (gastroesophageal reflux disease)   . Hypertension   . Kidney stones    None current, last had kidney stones in 1986      Review  of Systems  Constitutional: Negative for chills and fever.  Musculoskeletal:       Positive for right shoulder pain.  Neurological: Negative for weakness and numbness.      Objective:    BP 132/78 (BP Location: Left Arm, Patient Position: Sitting, Cuff Size: Large)   Pulse 92   Temp 98.7 F (37.1 C) (Oral)   Resp 16   Ht '5\' 7"'$  (1.702 m)   Wt 209 lb (94.8 kg)   SpO2 97%   BMI 32.73 kg/m  Nursing note and vital signs reviewed.  Physical Exam  Constitutional: She is oriented to person, place, and time. She appears well-developed and well-nourished. No distress.  Cardiovascular: Normal rate, regular rhythm, normal heart sounds  and intact distal pulses.   Pulmonary/Chest: Effort normal and breath sounds normal.  Musculoskeletal:  Right shoulder -no obvious deformity, discoloration, or edema. Mild tenderness over subacromial space. Range of motion is within normal limits with discomfort noted greater than 120 of flexion and abduction. There is some difficulty in discomfort with internal rotation and extension. Distal pulses and sensation are intact and appropriate. Negative Hawkins-Kennedy; positive Neer's impingement; positive empty can.  Neurological: She is alert and oriented to person, place, and time.  Skin: Skin is warm and dry.  Psychiatric: She has a normal mood and affect. Her behavior is normal. Judgment and thought content normal.       Assessment & Plan:   Problem List Items Addressed This Visit      Other   Right shoulder pain    Right shoulder pain appears consistent with rotator cuff tendinitis or possible subacromial bursitis. Treat conservatively with ice and home exercise therapy. Start Vimovo and Pennsaid. If symptoms worsen or do not improve consider ultrasound imaging, cortisone injection, and possible physical therapy.      Relevant Medications   Naproxen-Esomeprazole 500-20 MG TBEC   Diclofenac Sodium (PENNSAID) 2 % SOLN       I have discontinued Ms. Jessup's diclofenac sodium. I am also having her start on Naproxen-Esomeprazole and Diclofenac Sodium. Additionally, I am having her maintain her Loratadine (CLARITIN PO), Homeopathic Products (YEAST-GARD ADV HOMEOPATHIC PO), Multiple Vitamins-Minerals (MULTIVITAMIN PO), Aspirin-Acetaminophen-Caffeine (EXCEDRIN MIGRAINE PO), freestyle, glucose blood, glucose monitoring kit, atorvastatin, gabapentin, losartan-hydrochlorothiazide, canagliflozin, losartan-hydrochlorothiazide, INVOKANA, citalopram, and SitaGLIPtin-MetFORMIN HCl.   Meds ordered this encounter  Medications  . SitaGLIPtin-MetFORMIN HCl (JANUMET XR) 50-1000 MG TB24    Sig: TAKE 1  TABLET BY MOUTH TWICE A DAY BEFORE LUNCH AND SUPPER    Dispense:  180 tablet    Refill:  1  . Naproxen-Esomeprazole 500-20 MG TBEC    Sig: Take 1 tablet by mouth 2 (two) times daily as needed.    Dispense:  60 tablet    Refill:  0    Order Specific Question:   Supervising Provider    Answer:   Pricilla Holm A [[id number]]  . Diclofenac Sodium (PENNSAID) 2 % SOLN    Sig: Place 1 application onto the skin 2 (two) times daily as needed.    Dispense:  112 g    Refill:  1    Order Specific Question:   Supervising Provider    Answer:   Pricilla Holm A [[id number]]     Follow-up: Return in about 3 weeks (around 09/02/2016), or if symptoms worsen or fail to improve.  Mauricio Po, FNP

## 2016-08-12 NOTE — Patient Instructions (Addendum)
Thank you for choosing [facility]!  Ice x 20 minutes every 2 hours and as needed or following activity  Pennsaid - Approximately 1/2 packet to the affected site twice daily.  Vimovo - 1 tablet twice per day for the next 5-7 days and then as needed.  Exercises 1-2 times per day as instructed.   You will receive a call from [facility] regarding your Pennsaid/Duexis/Vimovo. The medication will be mailed to you and should cost you no more than $10 per item or possibly free depending upon your insurance.   Your prescription(s) have been submitted to your pharmacy or been printed and provided for you. Please take as directed and contact our office if you believe you are having problem(s) with the medication(s) or have any questions.  If your symptoms worsen or fail to improve, please contact our office for further instruction, or in case of emergency go directly to the emergency room at the closest medical facility.    Shoulder Impingement Syndrome Rehab Ask your health care provider which exercises are safe for you. Do exercises exactly as told by your health care provider and adjust them as directed. It is normal to feel mild stretching, pulling, tightness, or discomfort as you do these exercises, but you should stop right away if you feel sudden pain or your pain gets worse.Do not begin these exercises until told by your health care provider. Stretching and range of motion exercise This exercise warms up your muscles and joints and improves the movement and flexibility of your shoulder. This exercise also helps to relieve pain and stiffness. Exercise A: Passive horizontal adduction 1. Sit or stand and pull your left / right elbow across your chest, toward your other shoulder. Stop when you feel a gentle stretch in the back of your shoulder and upper arm.  Keep your arm at shoulder height.  Keep your arm as close to your body as you comfortably can. 2. Hold for __________  seconds. 3. Slowly return to the starting position. Repeat __________ times. Complete this exercise __________ times a day. Strengthening exercises These exercises build strength and endurance in your shoulder. Endurance is the ability to use your muscles for a long time, even after they get tired. Exercise B: External rotation, isometric 1. Stand or sit in a doorway, facing the door frame. 2. Bend your left / right elbow and place the back of your wrist against the door frame. Only your wrist should be touching the frame. Keep your upper arm at your side. 3. Gently press your wrist against the door frame, as if you are trying to push your arm away from your abdomen.  Avoid shrugging your shoulder while you press your hand against the door frame. Keep your shoulder blade tucked down toward the middle of your back. 4. Hold for __________ seconds. 5. Slowly release the tension, and relax your muscles completely before you do the exercise again. Repeat __________ times. Complete this exercise __________ times a day. Exercise C: Internal rotation, isometric 1. Stand or sit in a doorway, facing the door frame. 2. Bend your left / right elbow and place the inside of your wrist against the door frame. Only your wrist should be touching the frame. Keep your upper arm at your side. 3. Gently press your wrist against the door frame, as if you are trying to push your arm toward your abdomen.  Avoid shrugging your shoulder while you press your hand against the door frame. Keep your shoulder blade tucked  down toward the middle of your back. 4. Hold for __________ seconds. 5. Slowly release the tension, and relax your muscles completely before you do the exercise again. Repeat __________ times. Complete this exercise __________ times a day. Exercise D: Scapular protraction, supine 1. Lie on your back on a firm surface. Hold a __________ weight in your left / right hand. 2. Raise your left / right arm  straight into the air so your hand is directly above your shoulder joint. 3. Push the weight into the air so your shoulder lifts off of the surface that you are lying on. Do not move your head, neck, or back. 4. Hold for __________ seconds. 5. Slowly return to the starting position. Let your muscles relax completely before you repeat this exercise. Repeat __________ times. Complete this exercise __________ times a day. Exercise E: Scapular retraction 1. Sit in a stable chair without armrests, or stand. 2. Secure an exercise band to a stable object in front of you so the band is at shoulder height. 3. Hold one end of the exercise band in each hand. Your palms should face down. 4. Squeeze your shoulder blades together and move your elbows slightly behind you. Do not shrug your shoulders while you do this. 5. Hold for __________ seconds. 6. Slowly return to the starting position. Repeat __________ times. Complete this exercise __________ times a day. Exercise F: Shoulder extension 1. Sit in a stable chair without armrests, or stand. 2. Secure an exercise band to a stable object in front of you where the band is above shoulder height. 3. Hold one end of the exercise band in each hand. 4. Straighten your elbows and lift your hands up to shoulder height. 5. Squeeze your shoulder blades together and pull your hands down to the sides of your thighs. Stop when your hands are straight down by your sides. Do not let your hands go behind your body. 6. Hold for __________ seconds. 7. Slowly return to the starting position. Repeat __________ times. Complete this exercise __________ times a day. This information is not intended to replace advice given to you by your health care provider. Make sure you discuss any questions you have with your health care provider. Document Released: 09/12/2005 Document Revised: 05/19/2016 Document Reviewed: 08/15/2015 Elsevier Interactive Patient Education  2017 [employer].

## 2016-08-31 ENCOUNTER — Ambulatory Visit (INDEPENDENT_AMBULATORY_CARE_PROVIDER_SITE_OTHER): Payer: Managed Care, Other (non HMO) | Admitting: Internal Medicine

## 2016-08-31 ENCOUNTER — Encounter: Payer: Self-pay | Admitting: Internal Medicine

## 2016-08-31 VITALS — BP 148/88 | HR 74 | Temp 98.5°F | Resp 16 | Ht 67.0 in | Wt 208.1 lb

## 2016-08-31 DIAGNOSIS — L732 Hidradenitis suppurativa: Secondary | ICD-10-CM | POA: Diagnosis not present

## 2016-08-31 DIAGNOSIS — Z1231 Encounter for screening mammogram for malignant neoplasm of breast: Secondary | ICD-10-CM | POA: Insufficient documentation

## 2016-08-31 MED ORDER — CLINDAMYCIN PHOSPHATE 1 % EX GEL: Freq: Two times a day (BID) | CUTANEOUS | 1 refills | Status: DC

## 2016-08-31 NOTE — Progress Notes (Signed)
Subjective:  Patient ID: Rebecca Cook, female    DOB: 1955/10/13  Age: 60 y.o. MRN: [id number]  CC: Rash   HPI LENKA ZHAO presents for concerns about lumps in her armpits over the last few days. She is sensitive to deodorants and has recently been using a new one that has caused the skin in her armpits to turn slightly red but the rash is not symptomatic.  Outpatient Medications Prior to Visit  Medication Sig Dispense Refill  . Aspirin-Acetaminophen-Caffeine (EXCEDRIN MIGRAINE PO) Take by mouth as needed. OTC    . canagliflozin (INVOKANA) 100 MG TABS tablet Take 1 tablet (100 mg total) by mouth daily before breakfast. 90 tablet 2  . Diclofenac Sodium (PENNSAID) 2 % SOLN Place 1 application onto the skin 2 (two) times daily as needed. 112 g 1  . glucose blood (FREESTYLE TEST STRIPS) test strip Use as instructed 100 each 12  . glucose monitoring kit (FREESTYLE) monitoring kit 1 each by Does not apply route as needed for other. 2 each 0  . Homeopathic Products (YEAST-GARD ADV HOMEOPATHIC PO) Take by mouth. OTC    . INVOKANA 300 MG TABS tablet TAKE 1 TABLET BY MOUTH DAILY 30 tablet 5  . Lancets (FREESTYLE) lancets Use as instructed 100 each 12  . Loratadine (CLARITIN PO) Take by mouth. OTC    . losartan-hydrochlorothiazide (HYZAAR) 100-25 MG tablet Take 1 tablet by mouth daily. 90 tablet 1  . Multiple Vitamins-Minerals (MULTIVITAMIN PO) Take by mouth daily. OTC    . Naproxen-Esomeprazole 500-20 MG TBEC Take 1 tablet by mouth 2 (two) times daily as needed. 60 tablet 0  . SitaGLIPtin-MetFORMIN HCl (JANUMET XR) 50-1000 MG TB24 TAKE 1 TABLET BY MOUTH TWICE A DAY BEFORE LUNCH AND SUPPER 180 tablet 1  . atorvastatin (LIPITOR) 40 MG tablet TAKE 1 TABLET BY MOUTH DAILY (Patient not taking: Reported on 08/31/2016) 90 tablet 0  . citalopram (CELEXA) 20 MG tablet TAKE 1 TABLET (20 MG TOTAL) BY MOUTH DAILY. (Patient not taking: Reported on 08/31/2016) 30 tablet 0  . gabapentin (NEURONTIN) 100 MG  capsule TAKE 1 CAPSULE BY MOUTH EVERY EIGHT HOURS AS NEEDED (Patient not taking: Reported on 08/31/2016) 180 capsule 0  . losartan-hydrochlorothiazide (HYZAAR) 100-25 MG tablet Take 1 tablet by mouth daily. 90 tablet 0   No facility-administered medications prior to visit.     ROS Review of Systems  Constitutional: Negative for chills, fatigue and fever.  HENT: Negative.  Negative for sore throat.   Eyes: Negative for visual disturbance.  Respiratory: Negative for cough, choking, shortness of breath and stridor.   Cardiovascular: Negative for chest pain, palpitations and leg swelling.  Gastrointestinal: Negative for abdominal pain, diarrhea and nausea.  Endocrine: Negative.   Genitourinary: Negative for difficulty urinating.  Skin: Positive for rash.  Allergic/Immunologic: Negative.   Neurological: Negative.   Hematological: Negative.  Negative for adenopathy. Does not bruise/bleed easily.  Psychiatric/Behavioral: Negative.     Objective:  BP (!) 148/88 (BP Location: Left Arm, Patient Position: Sitting, Cuff Size: Large)   Pulse 74   Temp 98.5 F (36.9 C) (Oral)   Resp 16   Ht 5' 7" (1.702 m)   Wt 208 lb 2 oz (94.4 kg)   SpO2 96%   BMI 32.60 kg/m   BP Readings from Last 3 Encounters:  08/31/16 (!) 148/88  08/12/16 132/78  12/07/15 (!) 152/88    Wt Readings from Last 3 Encounters:  08/31/16 208 lb 2 oz (94.4 kg)  08/12/16  209 lb (94.8 kg)  12/07/15 201 lb (91.2 kg)    Physical Exam  Constitutional: She is oriented to person, place, and time. No distress.  HENT:  Mouth/Throat: Oropharynx is clear and moist. No oropharyngeal exudate.  Eyes: Conjunctivae are normal. Right eye exhibits no discharge. Left eye exhibits no discharge. No scleral icterus.  Neck: Normal range of motion. Neck supple. No JVD present. No tracheal deviation present. No thyromegaly present.  Cardiovascular: Normal rate, regular rhythm, normal heart sounds and intact distal pulses.  Exam reveals no  gallop and no friction rub.   No murmur heard. Pulmonary/Chest: Effort normal and breath sounds normal. No stridor. No respiratory distress. She has no wheezes. She has no rales. She exhibits no tenderness.  Abdominal: Soft. Bowel sounds are normal. She exhibits no distension and no mass. There is no tenderness. There is no rebound and no guarding.  Musculoskeletal: Normal range of motion. She exhibits no edema, tenderness or deformity.  Lymphadenopathy:       Head (right side): No submental, no submandibular, no tonsillar, no preauricular, no posterior auricular and no occipital adenopathy present.       Head (left side): No submental, no submandibular, no tonsillar, no preauricular, no posterior auricular and no occipital adenopathy present.    She has no cervical adenopathy.    She has no axillary adenopathy.       Right axillary: No pectoral and no lateral adenopathy present.       Left axillary: No pectoral and no lateral adenopathy present.      Right: No supraclavicular adenopathy present.       Left: No supraclavicular adenopathy present.  Neurological: She is oriented to person, place, and time.  Skin: Skin is warm. Rash noted. She is not diaphoretic. No erythema. No pallor.  Bilateral axillary region reveals a very faint erythema over the skin and on both sides there are some subcutaneous soft, squishy nodules with no pores, exudate, fluctuance, induration, or streaking.  Vitals reviewed.   Lab Results  Component Value Date   WBC 7.9 11/25/2013   HGB 15.7 (H) 11/25/2013   HCT 47.4 (H) 11/25/2013   PLT 236.0 11/25/2013   GLUCOSE 431 (H) 11/19/2015   CHOL 156 09/11/2014   TRIG 242.0 (H) 09/11/2014   HDL 33.50 (L) 09/11/2014   LDLDIRECT 90.6 09/11/2014   LDLCALC 155 (H) 11/25/2013   ALT 27 12/24/2014   AST 31 12/24/2014   NA 132 (L) 11/19/2015   K 4.4 11/19/2015   CL 97 11/19/2015   CREATININE 0.74 11/19/2015   BUN 13 11/19/2015   CO2 27 11/19/2015   TSH 1.94 09/11/2014    HGBA1C 11.0 (H) 11/19/2015   MICROALBUR 0.8 09/11/2014    No results found.  Assessment & Plan:   Celester was seen today for rash.  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  Visit for screening mammogram -     MM DIGITAL SCREENING BILATERAL; Future   I am having Ms. Charna Archer start on clindamycin. I am also having her maintain her Loratadine (CLARITIN PO), Homeopathic Products (YEAST-GARD ADV HOMEOPATHIC PO), Multiple Vitamins-Minerals (MULTIVITAMIN PO), Aspirin-Acetaminophen-Caffeine (EXCEDRIN MIGRAINE PO), freestyle, glucose blood, glucose monitoring kit, atorvastatin, gabapentin, canagliflozin, losartan-hydrochlorothiazide, INVOKANA, citalopram, SitaGLIPtin-MetFORMIN HCl, Naproxen-Esomeprazole, and Diclofenac Sodium.  Meds ordered this encounter  Medications  .  clindamycin (CLINDAGEL) 1 % gel    Sig: Apply topically 2 (two) times daily.    Dispense:  60 g    Refill:  1     Follow-up: Return in about 3 weeks (around 09/21/2016).  Scarlette Calico, MD

## 2016-08-31 NOTE — Progress Notes (Signed)
Pre visit review using our clinic review tool, if applicable. No additional management support is needed unless otherwise documented below in the visit note. 

## 2016-08-31 NOTE — Patient Instructions (Signed)
Hidradenitis Suppurativa Introduction Hidradenitis suppurativa is a long-term (chronic) skin disease that starts with blocked sweat glands or hair follicles. Bacteria may grow in these blocked openings of your skin. Hidradenitis suppurativa is like a severe form of acne that develops in areas of your body where acne would be unusual. It is most likely to affect the areas of your body where skin rubs against skin and becomes moist. This includes your:  Underarms.  Groin.  Genital areas.  Buttocks.  Upper thighs.  Breasts. Hidradenitis suppurativa may start out with small pimples. The pimples can develop into deep sores that break open (rupture) and drain pus. Over time your skin may thicken and become scarred. Hidradenitis suppurativa cannot be passed from person to person. What are the causes? The exact cause of hidradenitis suppurativa is not known. This condition may be due to:  Female and female hormones. The condition is rare before and after puberty.  An overactive body defense system (immune system). Your immune system may overreact to the blocked hair follicles or sweat glands and cause swelling and pus-filled sores. What increases the risk? You may have a higher risk of hidradenitis suppurativa if you:  Are a woman.  Are between ages 11 and 55.  Have a family history of hidradenitis suppurativa.  Have a personal history of acne.  Are overweight.  Smoke.  Take the drug lithium. What are the signs or symptoms? The first signs of an outbreak are usually painful skin bumps that look like pimples. As the condition progresses:  Skin bumps may get bigger and grow deeper into the skin.  Bumps under the skin may rupture and drain smelly pus.  Skin may become itchy and infected.  Skin may thicken and scar.  Drainage may continue through tunnels under the skin (fistulas).  Walking and moving your arms can become painful. How is this diagnosed? Your health care provider  may diagnose hidradenitis suppurativa based on your medical history and your signs and symptoms. A physical exam will also be done. You may need to see a health care provider who specializes in skin diseases (dermatologist). You may also have tests done to confirm the diagnosis. These can include:  Swabbing a sample of pus or drainage from your skin so it can be sent to the lab and tested for infection.  Blood tests to check for infection. How is this treated? The same treatment will not work for everybody with hidradenitis suppurativa. Your treatment will depend on how severe your symptoms are. You may need to try several treatments to find what works best for you. Part of your treatment may include cleaning and bandaging (dressing) your wounds. You may also have to take medicines, such as the following:  Antibiotics.  Acne medicines.  Medicines to block or suppress the immune system.  A diabetes medicine (metformin) is sometimes used to treat this condition.  For women, birth control pills can sometimes help relieve symptoms. You may need surgery if you have a severe case of hidradenitis suppurativa that does not respond to medicine. Surgery may involve:  Using a laser to clear the skin and remove hair follicles.  Opening and draining deep sores.  Removing the areas of skin that are diseased and scarred. Follow these instructions at home:  Learn as much as you can about your disease, and work closely with your health care providers.  Take medicines only as directed by your health care provider.  If you were prescribed an antibiotic medicine, finish it all   even if you start to feel better.  If you are overweight, losing weight may be very helpful. Try to reach and maintain a healthy weight.  Do not use any tobacco products, including cigarettes, chewing tobacco, or electronic cigarettes. If you need help quitting, ask your health care provider.  Do not shave the areas where you  get hidradenitis suppurativa.  Do not wear deodorant.  Wear loose-fitting clothes.  Try not to overheat and get sweaty.  Take a daily bleach bath as directed by your health care provider.  Fill your bathtub halfway with water.  Pour in  cup of unscented household bleach.  Soak for 5-10 minutes.  Cover sore areas with a warm, clean washcloth (compress) for 5-10 minutes. Contact a health care provider if:  You have a flare-up of hidradenitis suppurativa.  You have chills or a fever.  You are having trouble controlling your symptoms at home. This information is not intended to replace advice given to you by your health care provider. Make sure you discuss any questions you have with your health care provider. Document Released: 04/26/2004 Document Revised: 02/18/2016 Document Reviewed: 12/13/2013  2017 [employer]vier  

## 2016-11-11 ENCOUNTER — Ambulatory Visit (INDEPENDENT_AMBULATORY_CARE_PROVIDER_SITE_OTHER): Payer: Managed Care, Other (non HMO) | Admitting: Family

## 2016-11-11 ENCOUNTER — Encounter: Payer: Self-pay | Admitting: Family

## 2016-11-11 VITALS — BP 124/84 | HR 98 | Temp 98.8°F | Resp 16 | Ht 67.0 in | Wt 198.0 lb

## 2016-11-11 DIAGNOSIS — F418 Other specified anxiety disorders: Secondary | ICD-10-CM

## 2016-11-11 DIAGNOSIS — F329 Major depressive disorder, single episode, unspecified: Secondary | ICD-10-CM

## 2016-11-11 DIAGNOSIS — M7541 Impingement syndrome of right shoulder: Secondary | ICD-10-CM | POA: Diagnosis not present

## 2016-11-11 DIAGNOSIS — F419 Anxiety disorder, unspecified: Secondary | ICD-10-CM

## 2016-11-11 DIAGNOSIS — F32A Depression, unspecified: Secondary | ICD-10-CM

## 2016-11-11 MED ORDER — CITALOPRAM HYDROBROMIDE 20 MG PO TABS: ORAL_TABLET | ORAL | 0 refills | Status: DC

## 2016-11-11 NOTE — Progress Notes (Signed)
Subjective:    Patient ID: Rebecca Cook, female    DOB: [birth year], 61 y.o.   MRN: [id number]  Chief Complaint  Patient presents with  . shoulder injection    cortisone injection in right shoulder    HPI:  Rebecca Cook is a 61 y.o. female who  has a past medical history of Diabetes mellitus, type II ([place]); Frequent headaches; GERD (gastroesophageal reflux disease); Hypertension; and Kidney stones. and presents today for a follow up office visit.   1.)  Shoulder pain - Continues to experience the associated symptom of pain located in her right shoulder. Notes she reached around to grab something and felt a severe pain. Pain is aggravated with movement and rest immediately after movement. Does not have any restricted motion. Modifying factors include conservative therapy which she did for about 1 month and has since stopped with minimal improvement. Requesting next step of treatment.    2.) Anxiety/Depression - Previously diagnosed with depression and anxiety and maintained on citalopram which helped to control her symptoms which she indicates improved which led to discontinuation of the medication. Describes that she has been having a depressed mood with decreased energy and motivation and lays in bed at times. Has experienced episodes of tearfulness.  Denies suicidal ideations. Has had significant increase in stress and anxiety secondary to family and work related stressors.    No Known Allergies    Outpatient Medications Prior to Visit  Medication Sig Dispense Refill  . Aspirin-Acetaminophen-Caffeine (EXCEDRIN MIGRAINE PO) Take by mouth as needed. OTC    . atorvastatin (LIPITOR) 40 MG tablet TAKE 1 TABLET BY MOUTH DAILY 90 tablet 0  . canagliflozin (INVOKANA) 100 MG TABS tablet Take 1 tablet (100 mg total) by mouth daily before breakfast. 90 tablet 2  . clindamycin (CLINDAGEL) 1 % gel Apply topically 2 (two) times daily. 60 g 1  . Diclofenac Sodium (PENNSAID) 2 % SOLN Place 1  application onto the skin 2 (two) times daily as needed. 112 g 1  . gabapentin (NEURONTIN) 100 MG capsule TAKE 1 CAPSULE BY MOUTH EVERY EIGHT HOURS AS NEEDED 180 capsule 0  . glucose blood (FREESTYLE TEST STRIPS) test strip Use as instructed 100 each 12  . glucose monitoring kit (FREESTYLE) monitoring kit 1 each by Does not apply route as needed for other. 2 each 0  . Homeopathic Products (YEAST-GARD ADV HOMEOPATHIC PO) Take by mouth. OTC    . INVOKANA 300 MG TABS tablet TAKE 1 TABLET BY MOUTH DAILY 30 tablet 5  . Lancets (FREESTYLE) lancets Use as instructed 100 each 12  . Loratadine (CLARITIN PO) Take by mouth. OTC    . losartan-hydrochlorothiazide (HYZAAR) 100-25 MG tablet Take 1 tablet by mouth daily. 90 tablet 1  . Multiple Vitamins-Minerals (MULTIVITAMIN PO) Take by mouth daily. OTC    . SitaGLIPtin-MetFORMIN HCl (JANUMET XR) 50-1000 MG TB24 TAKE 1 TABLET BY MOUTH TWICE A DAY BEFORE LUNCH AND SUPPER 180 tablet 1  . Naproxen-Esomeprazole 500-20 MG TBEC Take 1 tablet by mouth 2 (two) times daily as needed. 60 tablet 0  . citalopram (CELEXA) 20 MG tablet TAKE 1 TABLET (20 MG TOTAL) BY MOUTH DAILY. (Patient not taking: Reported on 08/31/2016) 30 tablet 0   No facility-administered medications prior to visit.       Past Surgical History:  Procedure Laterality Date  . TONSILLECTOMY AND ADENOIDECTOMY  1985  . TUBAL LIGATION  1985      Past Medical History:  Diagnosis Date  .  Diabetes mellitus, type II ([place])   . Frequent headaches   . GERD (gastroesophageal reflux disease)   . Hypertension   . Kidney stones    None current, last had kidney stones in 1986      Review of Systems  Constitutional: Negative for chills and fever.  Musculoskeletal:       Positive for right shoulder pain.   Neurological: Negative for weakness and numbness.  Psychiatric/Behavioral: Positive for dysphoric mood. The patient is nervous/anxious.       Objective:    BP 124/84 (BP Location: Left Arm,  Patient Position: Sitting, Cuff Size: Large)   Pulse 98   Temp 98.8 F (37.1 C) (Oral)   Resp 16   Ht '5\' 7"'$  (1.702 m)   Wt 198 lb (89.8 kg)   SpO2 93%   BMI 31.01 kg/m  Nursing note and vital signs reviewed.  Physical Exam  Constitutional: She is oriented to person, place, and time. She appears well-developed and well-nourished. No distress.  Cardiovascular: Normal rate, regular rhythm, normal heart sounds and intact distal pulses.   Pulmonary/Chest: Effort normal and breath sounds normal.  Musculoskeletal:  Right shoulder - no obvious deformity, discoloration, or edema. Palpable tenderness elicited over subacromial space and supraspinatus tendon. There is some discomfort over the deltoid. No crepitus or deformity noted. Range of motion within normal limits. Distal pulses and sensation are intact and appropriate. Positive Neer's impingement; positive Michel Bickers; negative empty can.  Neurological: She is alert and oriented to person, place, and time.  Skin: Skin is warm and dry.  Psychiatric: She has a normal mood and affect. Her behavior is normal. Judgment and thought content normal.    Procedure: Corticosteroid injection of the right shoulder.  Description: Informed consent was obtained with discussion including risks and benefits of the procedure as well as alternatives. Patient verbally wished to continued. Time out was performed. The posterior approach was identified and marked. It was cleansed with betadine using a concentric circular pattern. Skin anesthesia was applied using cold spray applied for 10 seconds. Injection of 38m :4 ml of Kenalog (40 mg / ml) to Sensoricaine was injected following aspiration with no return noted. Following the injection there was instant relief confirming appropriate placement. A bandage was applied to the area and post care instructions were provided. The procedure was tolerated well with no complications.       Assessment & Plan:   Problem  List Items Addressed This Visit      Other   Anxiety and depression    Increased anxiety and depression secondary to work and family stressors. Restart citalopram. Denies suicidal ideations. Encouraged counseling which patient will see complaint assistance program through her employer. Follow-up in one month or sooner if needed.      Relevant Medications   citalopram (CELEXA) 20 MG tablet   Impingement syndrome of right shoulder - Primary    Symptoms and exam consistent with impingement of the right shoulder although cannot rule out rotator cuff tendinitis or subacromial bursitis. In office corticosteroid injection provided as described under procedure. Continue conservative treatment with ice and home exercise therapy. Follow-up if symptoms worsen or do not improve.          I have discontinued Ms. Jessup's Naproxen-Esomeprazole. I am also having her maintain her Loratadine (CLARITIN PO), Homeopathic Products (YEAST-GARD ADV HOMEOPATHIC PO), Multiple Vitamins-Minerals (MULTIVITAMIN PO), Aspirin-Acetaminophen-Caffeine (EXCEDRIN MIGRAINE PO), freestyle, glucose blood, glucose monitoring kit, atorvastatin, gabapentin, canagliflozin, losartan-hydrochlorothiazide, INVOKANA, SitaGLIPtin-MetFORMIN HCl, Diclofenac Sodium, clindamycin, and  citalopram.   Meds ordered this encounter  Medications  . citalopram (CELEXA) 20 MG tablet    Sig: TAKE 1 TABLET (20 MG TOTAL) BY MOUTH DAILY.    Dispense:  30 tablet    Refill:  0    Order Specific Question:   Supervising Provider    Answer:   Pricilla Holm A [[id number]]     Follow-up: Return in about 1 month (around 12/09/2016), or if symptoms worsen or fail to improve.  Mauricio Po, FNP

## 2016-11-11 NOTE — Assessment & Plan Note (Signed)
Increased anxiety and depression secondary to work and family stressors. Restart citalopram. Denies suicidal ideations. Encouraged counseling which patient will see complaint assistance program through her employer. Follow-up in one month or sooner if needed.

## 2016-11-11 NOTE — Patient Instructions (Signed)
Thank you for choosing [facility].  SUMMARY AND INSTRUCTIONS:  Ice x 20 minutes every 2 hours and as needed.   Limit physical activity x 24 hours with no heavy lifting.   Medication:  Your prescription(s) have been submitted to your pharmacy or been printed and provided for you. Please take as directed and contact our office if you believe you are having problem(s) with the medication(s) or have any questions.  Follow up:  If your symptoms worsen or fail to improve, please contact our office for further instruction, or in case of emergency go directly to the emergency room at the closest medical facility.

## 2016-11-11 NOTE — Assessment & Plan Note (Signed)
Symptoms and exam consistent with impingement of the right shoulder although cannot rule out rotator cuff tendinitis or subacromial bursitis. In office corticosteroid injection provided as described under procedure. Continue conservative treatment with ice and home exercise therapy. Follow-up if symptoms worsen or do not improve.

## 2016-11-16 ENCOUNTER — Other Ambulatory Visit: Payer: Self-pay | Admitting: Family

## 2017-02-06 ENCOUNTER — Other Ambulatory Visit: Payer: Self-pay | Admitting: Family

## 2017-02-06 DIAGNOSIS — F32A Depression, unspecified: Secondary | ICD-10-CM

## 2017-02-06 DIAGNOSIS — F419 Anxiety disorder, unspecified: Secondary | ICD-10-CM

## 2017-02-06 DIAGNOSIS — F329 Major depressive disorder, single episode, unspecified: Secondary | ICD-10-CM

## 2017-05-15 ENCOUNTER — Other Ambulatory Visit: Payer: Self-pay | Admitting: Family

## 2017-06-28 ENCOUNTER — Other Ambulatory Visit: Payer: Self-pay | Admitting: Family

## 2017-06-28 DIAGNOSIS — F419 Anxiety disorder, unspecified: Secondary | ICD-10-CM

## 2017-06-28 DIAGNOSIS — F329 Major depressive disorder, single episode, unspecified: Secondary | ICD-10-CM

## 2017-07-03 ENCOUNTER — Other Ambulatory Visit: Payer: Self-pay | Admitting: Family

## 2017-09-23 ENCOUNTER — Other Ambulatory Visit: Payer: Self-pay | Admitting: Family

## 2017-09-23 DIAGNOSIS — F419 Anxiety disorder, unspecified: Secondary | ICD-10-CM

## 2017-09-23 DIAGNOSIS — F329 Major depressive disorder, single episode, unspecified: Secondary | ICD-10-CM

## 2017-09-25 ENCOUNTER — Other Ambulatory Visit: Payer: Self-pay | Admitting: Family

## 2017-09-25 DIAGNOSIS — F329 Major depressive disorder, single episode, unspecified: Secondary | ICD-10-CM

## 2017-09-25 DIAGNOSIS — F419 Anxiety disorder, unspecified: Secondary | ICD-10-CM

## 2017-09-25 DIAGNOSIS — F32A Depression, unspecified: Secondary | ICD-10-CM

## 2017-10-17 ENCOUNTER — Encounter: Payer: Self-pay | Admitting: Family

## 2017-10-17 ENCOUNTER — Other Ambulatory Visit (INDEPENDENT_AMBULATORY_CARE_PROVIDER_SITE_OTHER): Payer: Managed Care, Other (non HMO)

## 2017-10-17 ENCOUNTER — Ambulatory Visit: Payer: Self-pay

## 2017-10-17 ENCOUNTER — Ambulatory Visit: Payer: Managed Care, Other (non HMO) | Admitting: Family

## 2017-10-17 VITALS — BP 144/88 | HR 86 | Temp 99.2°F | Resp 16 | Ht 67.0 in | Wt 206.0 lb

## 2017-10-17 DIAGNOSIS — F329 Major depressive disorder, single episode, unspecified: Secondary | ICD-10-CM | POA: Diagnosis not present

## 2017-10-17 DIAGNOSIS — E1165 Type 2 diabetes mellitus with hyperglycemia: Secondary | ICD-10-CM

## 2017-10-17 DIAGNOSIS — Z23 Encounter for immunization: Secondary | ICD-10-CM | POA: Diagnosis not present

## 2017-10-17 DIAGNOSIS — I10 Essential (primary) hypertension: Secondary | ICD-10-CM | POA: Diagnosis not present

## 2017-10-17 DIAGNOSIS — F32A Depression, unspecified: Secondary | ICD-10-CM

## 2017-10-17 DIAGNOSIS — R5383 Other fatigue: Secondary | ICD-10-CM

## 2017-10-17 DIAGNOSIS — F419 Anxiety disorder, unspecified: Secondary | ICD-10-CM

## 2017-10-17 LAB — CBC
HCT: 47.5 % — ABNORMAL HIGH (ref 36.0–46.0)
Hemoglobin: 15.9 g/dL — ABNORMAL HIGH (ref 12.0–15.0)
MCHC: 33.4 g/dL (ref 30.0–36.0)
MCV: 84.5 fl (ref 78.0–100.0)
Platelets: 241 10*3/uL (ref 150.0–400.0)
RBC: 5.62 Mil/uL — ABNORMAL HIGH (ref 3.87–5.11)
RDW: 14.4 % (ref 11.5–15.5)
WBC: 9.4 10*3/uL (ref 4.0–10.5)

## 2017-10-17 LAB — LIPID PANEL
Cholesterol: 250 mg/dL — ABNORMAL HIGH (ref 0–200)
HDL: 39.4 mg/dL (ref >39.00)
NonHDL: 210.95
Total CHOL/HDL Ratio: 6
Triglycerides: 335 mg/dL — ABNORMAL HIGH (ref 0.0–149.0)
VLDL: 67 mg/dL — ABNORMAL HIGH (ref 0.0–40.0)

## 2017-10-17 LAB — COMPREHENSIVE METABOLIC PANEL
ALT: 40 U/L — ABNORMAL HIGH (ref 0–35)
AST: 43 U/L — ABNORMAL HIGH (ref 0–37)
Albumin: 4.2 g/dL (ref 3.5–5.2)
Alkaline Phosphatase: 89 U/L (ref 39–117)
BUN: 13 mg/dL (ref 6–23)
CO2: 28 mEq/L (ref 19–32)
Calcium: 9.3 mg/dL (ref 8.4–10.5)
Chloride: 95 mEq/L — ABNORMAL LOW (ref 96–112)
Creatinine, Ser: 0.62 mg/dL (ref 0.40–1.20)
GFR: 103.3 mL/min (ref >60.00)
Glucose, Bld: 374 mg/dL — ABNORMAL HIGH (ref 70–99)
Potassium: 4 mEq/L (ref 3.5–5.1)
Sodium: 132 mEq/L — ABNORMAL LOW (ref 135–145)
Total Bilirubin: 0.8 mg/dL (ref 0.2–1.2)
Total Protein: 7.5 g/dL (ref 6.0–8.3)

## 2017-10-17 LAB — LDL CHOLESTEROL, DIRECT: Direct LDL: 195 mg/dL

## 2017-10-17 LAB — POCT GLYCOSYLATED HEMOGLOBIN (HGB A1C): Hemoglobin A1C: 10.6

## 2017-10-17 LAB — TSH: TSH: 2.11 u[IU]/mL (ref 0.35–4.50)

## 2017-10-17 MED ORDER — BLOOD GLUCOSE MONITOR KIT: PACK | 0 refills | Status: DC

## 2017-10-17 NOTE — Telephone Encounter (Signed)
Patient called in with c/o "dizziness." She says "I have been out of my Invokana since December, I found out my insurance will not pay for it and I don't have the 200 plus dollars to pay for it. I kept taking my janumet twice a day. My blood sugars have been over 400 at times, I've been told by the office to go to [facility] or ED. I don't have the money to do either one and I just need to get in to see a doctor. I feel weak, dizzy, no energy, nauseated at times. I can't check my blood sugar now because I ran out of the strips since about 1 week or so ago. I feel dizzy when I stand and I have to hold on to something when I walk." I explained to her the office practice is to make an established care appointment with Caesar Chestnut, NP before she can be seen in the office. She said "I was supposed to receive a call back with an appointment, but I never received the call."  An Established Care appointment was made for April 15 @ 4 pm with Caesar Chestnut, NP.  I informed the patient of the appointment and asked if I could put her on hold to speak with the flow coordinator to see if she can be seen in the office, she agreed.  I spoke with the flow coordinator, explained everything about what the patient said, the flow coordinator said the patient can be seen in the office today, since the established care appointment is made. I asked the patient would she be able to come in this afternoon, the patient said "yes." The flow coordinator made an appointment for her today at 1420. I informed the patient of her appointment, care advice given, patient verbalized understanding.   Reason for Disposition . [1] MODERATE dizziness (e.g., interferes with normal activities) AND [2] has NOT been evaluated by physician for this  (Exception: dizziness caused by heat exposure, sudden standing, or poor fluid intake)  Answer Assessment - Initial Assessment Questions 1. DESCRIPTION: "Describe your dizziness."     Feels like things are  moving around, have to steady myself 2. LIGHTHEADED: "Do you feel lightheaded?" (e.g., somewhat faint, woozy, weak upon standing)     Yes 3. VERTIGO: "Do you feel like either you or the room is spinning or tilting?" (i.e. vertigo)     Yes 4. SEVERITY: "How bad is it?"  "Do you feel like you are going to faint?" "Can you stand and walk?"   - MILD - walking normally   - MODERATE - interferes with normal activities (e.g., work, school)    - SEVERE - unable to stand, requires support to walk, feels like passing out now.      Moderate 5. ONSET:  "When did the dizziness begin?"     Several days 6. AGGRAVATING FACTORS: "Does anything make it worse?" (e.g., standing, change in head position)     Standing 7. HEART RATE: "Can you tell me your heart rate?" "How many beats in 15 seconds?"  (Note: not all patients can do this)        96 8. CAUSE: "What do you think is causing the dizziness?"     Maybe my blood sugar 9. RECURRENT SYMPTOM: "Have you had dizziness before?" If so, ask: "When was the last time?" "What happened that time?"     No 10. OTHER SYMPTOMS: "Do you have any other symptoms?" (e.g., fever, chest pain, vomiting, diarrhea, bleeding)  Weakness, nausea, no energy 11. PREGNANCY: "Is there any chance you are pregnant?" "When was your last menstrual period?"       No  Protocols used: DIZZINESS [facility]

## 2017-10-17 NOTE — Progress Notes (Signed)
Rebecca Cook is a 62 y.o. female with the following history as recorded in EpicCare:  Patient Active Problem List   Diagnosis Date Noted  . Hidradenitis axillaris 08/31/2016  . Visit for screening mammogram 08/31/2016  . Impingement syndrome of right shoulder 08/12/2016  . Anxiety and depression 11/19/2015  . Hyperlipidemia 09/02/2014  . Uncontrolled diabetes mellitus ([place]) 11/22/2013  . HTN (hypertension) 11/22/2013    Current Outpatient Medications  Medication Sig Dispense Refill  . Aspirin-Acetaminophen-Caffeine (EXCEDRIN MIGRAINE PO) Take by mouth as needed. OTC    . citalopram (CELEXA) 20 MG tablet TAKE 1 TABLET (20 MG TOTAL) BY MOUTH DAILY. 90 tablet 0  . Loratadine (CLARITIN PO) Take by mouth. OTC    . losartan-hydrochlorothiazide (HYZAAR) 100-25 MG tablet TAKE 1 TABLET BY MOUTH EVERY DAY 90 tablet 1  . Multiple Vitamins-Minerals (MULTIVITAMIN PO) Take by mouth daily. OTC    . SitaGLIPtin-MetFORMIN HCl (JANUMET XR) 50-1000 MG TB24 TAKE 1 TABLET BY MOUTH TWICE A DAY BEFORE LUNCH AND SUPPER 180 tablet 1  . blood glucose meter kit and supplies KIT Dispense based on patient and insurance preference. Use up to four times daily as directed. (FOR ICD-9 250.00, 250.01). 1 each 0   No current facility-administered medications for this visit.     Allergies: Patient has no known allergies.  Past Medical History:  Diagnosis Date  . Diabetes mellitus, type II ([place])   . Frequent headaches   . GERD (gastroesophageal reflux disease)   . Hypertension   . Kidney stones    None current, last had kidney stones in 1986    Past Surgical History:  Procedure Laterality Date  . TONSILLECTOMY AND ADENOIDECTOMY  1985  . TUBAL LIGATION  1985    Family History  Problem Relation Age of Onset  . Cancer Mother        Lung  . Heart disease Father   . Hypertension Father   . Heart disease Paternal Grandmother        Thyroid  . Hypertension Paternal Grandmother   . Cancer - Other Brother   .  Diabetes Maternal Grandfather     Social History   Tobacco Use  . Smoking status: Former Research scientist (life sciences)  . Smokeless tobacco: Never Used  Substance Use Topics  . Alcohol use: Yes    Alcohol/week: 0.0 oz    Comment: occ    Subjective:  Follow-up on Type 2 Diabetes and HTN; has not had Hgba1c in almost 2 years; not currently on Invokana due to cost concerns; only on Janumet bid; blood sugars are running in the 400s; admits to being very tired-"just no energy;" Denies any chest pain or shortness of breath; is surprised to see blood pressure is elevated today; does take medication daily; does check pressure regularly and has been running normal;   Objective:  Vitals:   10/17/17 1441  BP: (!) 144/88  Pulse: 86  Resp: 16  Temp: 99.2 F (37.3 C)  TempSrc: Oral  SpO2: 97%  Weight: 206 lb (93.4 kg)  Height: '5\' 7"'$  (1.702 m)    General: Well developed, well nourished, in no acute distress  Skin : Warm and dry.  Head: Normocephalic and atraumatic  Eyes: Sclera and conjunctiva clear; pupils round and reactive to light; extraocular movements intact  Ears: External normal; canals clear; tympanic membranes normal  Oropharynx: Pink, supple. No suspicious lesions  Neck: Supple without thyromegaly, adenopathy  Lungs: Respirations unlabored; clear to auscultation bilaterally without wheeze, rales, rhonchi  CVS exam: normal  rate, regular rhythm, normal S1, S2, no murmurs, rubs, clicks or gallops.  Neurologic: Alert and oriented; speech intact; face symmetrical; moves all extremities well; CNII-XII intact without focal deficit  Assessment:  1. Uncontrolled type 2 diabetes mellitus with hyperglycemia ([facility])   2. Other fatigue   3. Need for influenza vaccination   4. Essential hypertension   5. Anxiety and depression     Plan:  1. Hgba1c today is 10.6; continue Janumet bid and start Lantus 10 units nightly; first injection is given in office; she understands to increase by 2 units every 2 days until  fasting BS averaging 110-120; follow-up in 2 weeks; will need to talk about statin and Baby ASA at next OV; check CBC, CMP today; 2. Check TSH due to FH of thyroid disease; 3. Flu shot given; 4. ? Control; will re-check in 2 weeks when blood sugar more controlled; may need another agent; 5. Stable on Celexa;   Return in about 2 weeks (around 10/31/2017) for with Mickel Baas.  Orders Placed This Encounter  Procedures  . Flu Vaccine QUAD 36+ mos IM  . CBC    Standing Status:   Future    Number of Occurrences:   1    Standing Expiration Date:   10/17/2018  . Comprehensive metabolic panel    Standing Status:   Future    Number of Occurrences:   1    Standing Expiration Date:   10/17/2018    Order Specific Question:   Has the patient fasted?    Answer:   No  . Lipid panel    Standing Status:   Future    Number of Occurrences:   1    Standing Expiration Date:   10/17/2018    Order Specific Question:   Has the patient fasted?    Answer:   No  . TSH    Standing Status:   Future    Number of Occurrences:   1    Standing Expiration Date:   10/17/2018  . POCT HgB A1C    Requested Prescriptions   Signed Prescriptions Disp Refills  . blood glucose meter kit and supplies KIT 1 each 0    Sig: Dispense based on patient and insurance preference. Use up to four times daily as directed. (FOR ICD-9 250.00, 250.01).

## 2017-10-17 NOTE — Patient Instructions (Signed)
Please take Lantus 10 units at night today and tomorrow; increase by 2 units every two days until fasting BS consistently 110-120;

## 2017-10-18 ENCOUNTER — Other Ambulatory Visit: Payer: Self-pay | Admitting: Family

## 2017-10-18 DIAGNOSIS — F329 Major depressive disorder, single episode, unspecified: Secondary | ICD-10-CM

## 2017-10-18 DIAGNOSIS — F419 Anxiety disorder, unspecified: Secondary | ICD-10-CM

## 2017-10-18 MED ORDER — ROSUVASTATIN CALCIUM 10 MG PO TABS: 10.0000 mg | ORAL_TABLET | Freq: Every day | ORAL | 2 refills | Status: DC

## 2017-10-20 NOTE — Progress Notes (Signed)
Patient seen by Jodi Mourning, NP in office on 10/17/2017; patient aware of results at time of OV;

## 2017-11-01 ENCOUNTER — Encounter: Payer: Self-pay | Admitting: Family

## 2017-11-01 ENCOUNTER — Ambulatory Visit: Payer: Managed Care, Other (non HMO) | Admitting: Family

## 2017-11-01 VITALS — BP 124/80 | HR 88 | Temp 97.8°F | Ht 67.0 in | Wt 202.0 lb

## 2017-11-01 DIAGNOSIS — E1165 Type 2 diabetes mellitus with hyperglycemia: Secondary | ICD-10-CM | POA: Diagnosis not present

## 2017-11-01 DIAGNOSIS — F329 Major depressive disorder, single episode, unspecified: Secondary | ICD-10-CM | POA: Diagnosis not present

## 2017-11-01 DIAGNOSIS — F419 Anxiety disorder, unspecified: Secondary | ICD-10-CM

## 2017-11-01 DIAGNOSIS — Z1231 Encounter for screening mammogram for malignant neoplasm of breast: Secondary | ICD-10-CM

## 2017-11-01 LAB — POCT GLYCOSYLATED HEMOGLOBIN (HGB A1C): Hemoglobin A1C: 10

## 2017-11-01 MED ORDER — INSULIN GLARGINE 100 UNIT/ML SOLOSTAR PEN: 20.0000 [IU] | PEN_INJECTOR | Freq: Every day | SUBCUTANEOUS | 2 refills | Status: DC

## 2017-11-01 MED ORDER — INSULIN PEN NEEDLE 32G X 6 MM MISC: 1.0000 [IU] | Freq: Every day | 1 refills | Status: DC

## 2017-11-01 MED ORDER — CITALOPRAM HYDROBROMIDE 20 MG PO TABS: 20.0000 mg | ORAL_TABLET | Freq: Every day | ORAL | 1 refills | Status: DC

## 2017-11-01 NOTE — Progress Notes (Signed)
Rebecca Cook is a 62 y.o. female with the following history as recorded in EpicCare:  Patient Active Problem List   Diagnosis Date Noted  . Hidradenitis axillaris 08/31/2016  . Visit for screening mammogram 08/31/2016  . Impingement syndrome of right shoulder 08/12/2016  . Anxiety and depression 11/19/2015  . Hyperlipidemia 09/02/2014  . Uncontrolled diabetes mellitus ([employer]) 11/22/2013  . HTN (hypertension) 11/22/2013    Current Outpatient Medications  Medication Sig Dispense Refill  . Aspirin-Acetaminophen-Caffeine (EXCEDRIN MIGRAINE PO) Take by mouth as needed. OTC    . blood glucose meter kit and supplies KIT Dispense based on patient and insurance preference. Use up to four times daily as directed. (FOR ICD-9 250.00, 250.01). 1 each 0  . citalopram (CELEXA) 20 MG tablet Take 1 tablet (20 mg total) by mouth daily. 90 tablet 1  . Loratadine (CLARITIN PO) Take by mouth. OTC    . losartan-hydrochlorothiazide (HYZAAR) 100-25 MG tablet TAKE 1 TABLET BY MOUTH EVERY DAY 90 tablet 1  . Multiple Vitamins-Minerals (MULTIVITAMIN PO) Take by mouth daily. OTC    . rosuvastatin (CRESTOR) 10 MG tablet Take 1 tablet (10 mg total) by mouth daily. 30 tablet 2  . SitaGLIPtin-MetFORMIN HCl (JANUMET XR) 50-1000 MG TB24 TAKE 1 TABLET BY MOUTH TWICE A DAY BEFORE LUNCH AND SUPPER 180 tablet 1  . Insulin Glargine (LANTUS SOLOSTAR) 100 UNIT/ML Solostar Pen Inject 20 Units into the skin daily at 10 pm. 5 pen 2  . Insulin Pen Needle 32G X 6 MM MISC 1 Units by Does not apply route daily. Use daily as directed to inject insulin 100 each 1   No current facility-administered medications for this visit.     Allergies: Patient has no known allergies.  Past Medical History:  Diagnosis Date  . Diabetes mellitus, type II ([place])   . Frequent headaches   . GERD (gastroesophageal reflux disease)   . Hypertension   . Kidney stones    None current, last had kidney stones in 1986    Past Surgical History:  Procedure  Laterality Date  . TONSILLECTOMY AND ADENOIDECTOMY  1985  . TUBAL LIGATION  1985    Family History  Problem Relation Age of Onset  . Cancer Mother        Lung  . Heart disease Father   . Hypertension Father   . Heart disease Paternal Grandmother        Thyroid  . Hypertension Paternal Grandmother   . Cancer - Other Brother   . Diabetes Maternal Grandfather     Social History   Tobacco Use  . Smoking status: Former Research scientist (life sciences)  . Smokeless tobacco: Never Used  Substance Use Topics  . Alcohol use: Yes    Alcohol/week: 0.0 oz    Comment: occ    Subjective:  2 week follow-up on recent start of Lantus for uncontrolled diabetes and Crestor; notes she feels "so much better." Urinary frequency has resolved; Denies any chest pain, shortness of breath, blurred vision or headache; blood sugars are averaging 115-120 with 20 units of Lantus and Janumet bid; no concerns for low blood sugar; " I feel like myself again."   Objective:  Vitals:   11/01/17 1612  BP: 124/80  Pulse: 88  Temp: 97.8 F (36.6 C)  TempSrc: Oral  SpO2: 98%  Weight: 202 lb (91.6 kg)  Height: _0  (1.702 m)    General: Well developed, well nourished, in no acute distress  Skin : Warm and dry.  Head: Normocephalic and  atraumatic  Eyes: Sclera and conjunctiva clear; pupils round and reactive to light; extraocular movements intact  Ears: External normal; canals clear; tympanic membranes normal  Oropharynx: Pink, supple. No suspicious lesions  Neck: Supple without thyromegaly, adenopathy  Lungs: Respirations unlabored; clear to auscultation bilaterally without wheeze, rales, rhonchi  CVS exam: normal rate and regular rhythm.  Neurologic: Alert and oriented; speech intact; face symmetrical; moves all extremities well; CNII-XII intact without focal deficit   Assessment:  1. Uncontrolled type 2 diabetes mellitus with hyperglycemia ([place])   2. Anxiety and depression   3. Screening mammogram, encounter for     Plan:   1. Good response to current regimen; continue Lanuts 20 units and Janumet bid; keep planned follow-up for April 2019 with new PCP; recommend to start daily baby Aspirin 81 mg as well; 2. Stable; refill updated; 3. Refill for mammogram updated; she plans to call her GYN to schedule follow-up;  At next visit, need to update Prevnar; she will check on status of Tdap for that visit as well; will also need Hep C screen with repeat CBC, CMP, lipid panel and Hgba1c;   No Follow-up on file.  Orders Placed This Encounter  Procedures  . MM Digital Screening    Standing Status:   Future    Standing Expiration Date:   12/31/2018    Order Specific Question:   Reason for Exam (SYMPTOM  OR DIAGNOSIS REQUIRED)    Answer:   screening mammogram    Order Specific Question:   Preferred imaging location?    Answer:   [facility]  . POCT glycosylated hemoglobin (Hb A1C)    Requested Prescriptions   Signed Prescriptions Disp Refills  . citalopram (CELEXA) 20 MG tablet 90 tablet 1    Sig: Take 1 tablet (20 mg total) by mouth daily.  . Insulin Pen Needle 32G X 6 MM MISC 100 each 1    Sig: 1 Units by Does not apply route daily. Use daily as directed to inject insulin  . Insulin Glargine (LANTUS SOLOSTAR) 100 UNIT/ML Solostar Pen 5 pen 2    Sig: Inject 20 Units into the skin daily at 10 pm.

## 2017-11-01 NOTE — Patient Instructions (Addendum)
Please start Baby aspirin 81 mg daily; Please check on the status of your Tdap;

## 2017-11-06 ENCOUNTER — Other Ambulatory Visit: Payer: Self-pay

## 2017-11-06 MED ORDER — LOSARTAN POTASSIUM-HCTZ 100-25 MG PO TABS: 1.0000 | ORAL_TABLET | Freq: Every day | ORAL | 0 refills | Status: DC

## 2017-12-14 ENCOUNTER — Other Ambulatory Visit: Payer: Self-pay | Admitting: Family

## 2017-12-29 ENCOUNTER — Telehealth: Payer: Self-pay | Admitting: Family

## 2017-12-29 MED ORDER — SITAGLIP PHOS-METFORMIN HCL ER 50-1000 MG PO TB24: ORAL_TABLET | ORAL | 0 refills | Status: DC

## 2017-12-29 NOTE — Telephone Encounter (Signed)
Sent Janumet pls advise on lantus.Marland KitchenJohny Chess

## 2017-12-29 NOTE — Telephone Encounter (Signed)
Pt would like a refill of her   SitaGLIPtin-MetFORMIN HCl (JANUMET XR) 50-1000 MG  Also her LANTUS is not covered, she believed trulicity is covered  Can she get this please   Please send to [facility]  In [place]

## 2018-01-01 ENCOUNTER — Telehealth: Payer: Self-pay | Admitting: Family

## 2018-01-01 NOTE — Telephone Encounter (Signed)
How are her blood sugars doing on current regimen? Usually, if Lantus is not covered, Levemir ( comparable insulin) is; I am not opposed to trying the Trulicity- just need to make sure her blood sugars are stable and she is feeling better.

## 2018-01-01 NOTE — Telephone Encounter (Signed)
Error- lwm

## 2018-01-01 NOTE — Telephone Encounter (Signed)
Called pt no answer LMOM RTC. Sent [facility] w/info for FYI.Marland KitchenJohny Chess

## 2018-01-02 NOTE — Telephone Encounter (Signed)
Called pt again gave her laura response below. Pt states her BS has creep back up its been running in 200's-250. She states she didn't know what insulin would work for her since the insurance will not cover the lantus. She ? If Toujeo is compatible to the lantus she saw on the tv that the copay would be $10.00. Inform pt Mickel Baas is out of the office today, but soon as she respond I will give her a call a bck w/her response.Marland KitchenJohny Chess

## 2018-01-02 NOTE — Telephone Encounter (Deleted)
If her sugars are in the 200-250 range, I think Trulicity would be a good option. She would stay on her Janumet though.  Trulicity is a once a week injection as opposed to daily insulin; it often helps people lose weight too. Toujeo is in the same class as Lantus and we can consider that if she doesn't respond to the Trulicity.  Keep the follow-up appointment that she already has for later this month please.

## 2018-01-02 NOTE — Telephone Encounter (Signed)
Notified pt w/Rebecca Cook response. She agree w/starting the trulicity but need to contact her insurance first to see if they will cover. She states will call back to let us know.Marland KitchenJohny Chess

## 2018-01-02 NOTE — Telephone Encounter (Signed)
Trulicity is a great drug but we have to stop the Janumet and just put her on Metformin; can't take both Januvia and Trulicity.  I think that is a reasonable option- Trulicity once a week and Metformin twice a day; Many patient lose weight on Trulicity as well.  If she is uncomfortable with this, we can use an alternative insulin. Please let me know her response.

## 2018-01-03 ENCOUNTER — Ambulatory Visit: Payer: Managed Care, Other (non HMO) | Admitting: Family

## 2018-01-08 ENCOUNTER — Ambulatory Visit: Payer: Managed Care, Other (non HMO) | Admitting: Nurse Practitioner

## 2018-01-10 ENCOUNTER — Other Ambulatory Visit: Payer: Self-pay | Admitting: Family

## 2018-01-17 ENCOUNTER — Encounter: Payer: Self-pay | Admitting: Family

## 2018-01-17 ENCOUNTER — Ambulatory Visit: Payer: Managed Care, Other (non HMO) | Admitting: Family

## 2018-01-17 VITALS — BP 148/90 | HR 90 | Temp 98.1°F | Ht 67.0 in | Wt 199.0 lb

## 2018-01-17 DIAGNOSIS — E1165 Type 2 diabetes mellitus with hyperglycemia: Secondary | ICD-10-CM | POA: Diagnosis not present

## 2018-01-17 DIAGNOSIS — I10 Essential (primary) hypertension: Secondary | ICD-10-CM

## 2018-01-17 LAB — POCT GLYCOSYLATED HEMOGLOBIN (HGB A1C): Hemoglobin A1C: 11.1

## 2018-01-17 MED ORDER — INSULIN PEN NEEDLE 32G X 6 MM MISC: 1.0000 [IU] | Freq: Every day | 3 refills | Status: DC

## 2018-01-17 MED ORDER — BASAGLAR KWIKPEN 100 UNIT/ML ~~LOC~~ SOPN: 20.0000 [IU] | PEN_INJECTOR | Freq: Every day | SUBCUTANEOUS | 1 refills | Status: DC

## 2018-01-17 MED ORDER — ROSUVASTATIN CALCIUM 10 MG PO TABS: 10.0000 mg | ORAL_TABLET | Freq: Every day | ORAL | 1 refills | Status: DC

## 2018-01-17 NOTE — Progress Notes (Signed)
Reviewed with patient in office; medication changes made

## 2018-01-17 NOTE — Progress Notes (Signed)
Rebecca Cook is a 62 y.o. female with the following history as recorded in Epic[facility]:  Patient Active Problem List   Diagnosis Date Noted  . Hidradenitis axillaris 08/31/2016  . Visit for screening mammogram 08/31/2016  . Impingement syndrome of right shoulder 08/12/2016  . Anxiety and depression 11/19/2015  . Hyperlipidemia 09/02/2014  . Uncontrolled diabetes mellitus ([place]) 11/22/2013  . HTN (hypertension) 11/22/2013    Current Outpatient Medications  Medication Sig Dispense Refill  . aspirin EC 81 MG tablet Take 81 mg by mouth daily.    . Aspirin-Acetaminophen-Caffeine (EXCEDRIN MIGRAINE PO) Take by mouth as needed. OTC    . blood glucose meter kit and supplies KIT Dispense based on patient and insurance preference. Use up to four times daily as directed. (FOR ICD-9 250.00, 250.01). 1 each 0  . citalopram (CELEXA) 20 MG tablet Take 1 tablet (20 mg total) by mouth daily. 90 tablet 1  . Insulin Pen Needle 32G X 6 MM MISC 1 Units by Does not apply route daily. Use daily as directed to inject insulin 100 each 3  . Loratadine (CLARITIN PO) Take by mouth. OTC    . losartan-hydrochlorothiazide (HYZAAR) 100-25 MG tablet Take 1 tablet by mouth daily. 90 tablet 0  . Multiple Vitamins-Minerals (MULTIVITAMIN PO) Take by mouth daily. OTC    . rosuvastatin (CRESTOR) 10 MG tablet Take 1 tablet (10 mg total) by mouth daily. 90 tablet 1  . SitaGLIPtin-MetFORMIN HCl (JANUMET XR) 50-1000 MG TB24 TAKE 1 TABLET BY MOUTH TWICE A DAY BEFORE LUNCH AND SUPPER 180 tablet 0  . Insulin Glargine (BASAGLAR KWIKPEN) 100 UNIT/ML SOPN Inject 0.2 mLs (20 Units total) into the skin at bedtime. 20 units at night as directed; 15 mL 1   No current facility-administered medications for this visit.     Allergies: Patient has no known allergies.  Past Medical History:  Diagnosis Date  . Diabetes mellitus, type II ([place])   . Frequent headaches   . GERD (gastroesophageal reflux disease)   . Hypertension   . Kidney stones     None current, last had kidney stones in 1986    Past Surgical History:  Procedure Laterality Date  . TONSILLECTOMY AND ADENOIDECTOMY  1985  . TUBAL LIGATION  1985    Family History  Problem Relation Age of Onset  . Cancer Mother        Lung  . Heart disease Father   . Hypertension Father   . Heart disease Paternal Grandmother        Thyroid  . Hypertension Paternal Grandmother   . Cancer - Other Brother   . Diabetes Maternal Grandfather     Social History   Tobacco Use  . Smoking status: Former Research scientist (life sciences)  . Smokeless tobacco: Never Used  Substance Use Topics  . Alcohol use: Yes    Alcohol/week: 0.0 oz    Comment: occ    Subjective:  Patient presents to follow-up on Type 2 diabetes; has been unable to be on Lantus x 1 month due to insurance costs; on Janumet bid with no GI issues; notes that blood sugar is averaging around 300; Denies any chest pain, shortness of breath, blurred vision or headache.  Is surprised to see blood pressure elevated; checks regularly and averages 130/80;   Objective:  Vitals:   01/17/18 1605  BP: (!) 148/90  Pulse: 90  Temp: 98.1 F (36.7 C)  TempSrc: Oral  SpO2: 94%  Weight: 199 lb (90.3 kg)  Height: '5\' 7"'$  (1.702  m)    General: Well developed, well nourished, in no acute distress  Skin : Warm and dry.  Head: Normocephalic and atraumatic  Lungs: Respirations unlabored; clear to auscultation bilaterally without wheeze, rales, rhonchi  CVS exam: normal rate and regular rhythm.  Neurologic: Alert and oriented; speech intact; face symmetrical; moves all extremities well; CNII-XII intact without focal deficit  Assessment:  1. Uncontrolled type 2 diabetes mellitus with hyperglycemia ([place])   2. Essential hypertension     Plan:  1. Hgba1c at 11.1; re-start insulin- will use Basaglar which is formulary as opposed to Lantus; continue Janumet; follow-up in 1 month; 2. ? Control; she will check regularly over the next month and bring readings  to next OV; may need to add another medication.   No follow-ups on file.  Orders Placed This Encounter  Procedures  . POCT glycosylated hemoglobin (Hb A1C)    Requested Prescriptions   Signed Prescriptions Disp Refills  . Insulin Glargine (BASAGLAR KWIKPEN) 100 UNIT/ML SOPN 15 mL 1    Sig: Inject 0.2 mLs (20 Units total) into the skin at bedtime. 20 units at night as directed;  . Insulin Pen Needle 32G X 6 MM MISC 100 each 3    Sig: 1 Units by Does not apply route daily. Use daily as directed to inject insulin  . rosuvastatin (CRESTOR) 10 MG tablet 90 tablet 1    Sig: Take 1 tablet (10 mg total) by mouth daily.

## 2018-01-19 ENCOUNTER — Ambulatory Visit
Admission: RE | Admit: 2018-01-19 | Discharge: 2018-01-19 | Disposition: A | Discharge status: Home / Self Care | Payer: Managed Care, Other (non HMO) | Source: Ambulatory Visit | Attending: Family | Admitting: Family

## 2018-01-19 DIAGNOSIS — Z1231 Encounter for screening mammogram for malignant neoplasm of breast: Secondary | ICD-10-CM

## 2018-01-23 ENCOUNTER — Other Ambulatory Visit: Payer: Self-pay | Admitting: Family

## 2018-01-23 DIAGNOSIS — R928 Other abnormal and inconclusive findings on diagnostic imaging of breast: Secondary | ICD-10-CM

## 2018-01-25 ENCOUNTER — Ambulatory Visit
Admission: RE | Admit: 2018-01-25 | Discharge: 2018-01-25 | Disposition: A | Discharge status: Home / Self Care | Payer: Managed Care, Other (non HMO) | Source: Ambulatory Visit | Attending: Family | Admitting: Family

## 2018-01-25 DIAGNOSIS — R928 Other abnormal and inconclusive findings on diagnostic imaging of breast: Secondary | ICD-10-CM

## 2018-01-31 ENCOUNTER — Other Ambulatory Visit: Payer: Self-pay | Admitting: Nurse Practitioner

## 2018-02-02 NOTE — Telephone Encounter (Signed)
Please advise regarding refill.  Thanks 

## 2018-02-16 ENCOUNTER — Ambulatory Visit: Payer: Managed Care, Other (non HMO) | Admitting: Family

## 2018-02-16 ENCOUNTER — Encounter: Payer: Self-pay | Admitting: Family

## 2018-02-16 VITALS — BP 138/82 | HR 80 | Temp 97.8°F | Ht 67.0 in | Wt 205.0 lb

## 2018-02-16 DIAGNOSIS — I10 Essential (primary) hypertension: Secondary | ICD-10-CM | POA: Diagnosis not present

## 2018-02-16 DIAGNOSIS — E782 Mixed hyperlipidemia: Secondary | ICD-10-CM | POA: Diagnosis not present

## 2018-02-16 DIAGNOSIS — E1165 Type 2 diabetes mellitus with hyperglycemia: Secondary | ICD-10-CM

## 2018-02-16 NOTE — Patient Instructions (Signed)
Stop the Crestor as we discussed; let me hear from you the first week in June;

## 2018-02-16 NOTE — Progress Notes (Signed)
Rebecca Cook is a 62 y.o. female with the following history as recorded in Epic[facility]:  Patient Active Problem List   Diagnosis Date Noted  . Hidradenitis axillaris 08/31/2016  . Visit for screening mammogram 08/31/2016  . Impingement syndrome of right shoulder 08/12/2016  . Anxiety and depression 11/19/2015  . Hyperlipidemia 09/02/2014  . Uncontrolled diabetes mellitus ([place]) 11/22/2013  . HTN (hypertension) 11/22/2013    Current Outpatient Medications  Medication Sig Dispense Refill  . aspirin EC 81 MG tablet Take 81 mg by mouth daily.    . Aspirin-Acetaminophen-Caffeine (EXCEDRIN MIGRAINE PO) Take by mouth as needed. OTC    . blood glucose meter kit and supplies KIT Dispense based on patient and insurance preference. Use up to four times daily as directed. (FOR ICD-9 250.00, 250.01). 1 each 0  . citalopram (CELEXA) 20 MG tablet Take 1 tablet (20 mg total) by mouth daily. 90 tablet 1  . Insulin Glargine (BASAGLAR KWIKPEN) 100 UNIT/ML SOPN Inject 0.2 mLs (20 Units total) into the skin at bedtime. 20 units at night as directed; 15 mL 1  . Insulin Pen Needle 32G X 6 MM MISC 1 Units by Does not apply route daily. Use daily as directed to inject insulin 100 each 3  . Loratadine (CLARITIN PO) Take by mouth. OTC    . losartan-hydrochlorothiazide (HYZAAR) 100-25 MG tablet TAKE 1 TABLET BY MOUTH EVERY DAY 90 tablet 0  . Multiple Vitamins-Minerals (MULTIVITAMIN PO) Take by mouth daily. OTC    . rosuvastatin (CRESTOR) 10 MG tablet Take 1 tablet (10 mg total) by mouth daily. 90 tablet 1  . SitaGLIPtin-MetFORMIN HCl (JANUMET XR) 50-1000 MG TB24 TAKE 1 TABLET BY MOUTH TWICE A DAY BEFORE LUNCH AND SUPPER 180 tablet 0   No current facility-administered medications for this visit.     Allergies: Patient has no known allergies.  Past Medical History:  Diagnosis Date  . Diabetes mellitus, type II ([place])   . Frequent headaches   . GERD (gastroesophageal reflux disease)   . Hypertension   . Kidney  stones    None current, last had kidney stones in 1986    Past Surgical History:  Procedure Laterality Date  . TONSILLECTOMY AND ADENOIDECTOMY  1985  . TUBAL LIGATION  1985    Family History  Problem Relation Age of Onset  . Cancer Mother        Lung  . Heart disease Father   . Hypertension Father   . Heart disease Paternal Grandmother        Thyroid  . Hypertension Paternal Grandmother   . Cancer - Other Brother   . Diabetes Maternal Grandfather     Social History   Tobacco Use  . Smoking status: Former Research scientist (life sciences)  . Smokeless tobacco: Never Used  Substance Use Topics  . Alcohol use: Yes    Alcohol/week: 0.0 oz    Comment: occ    Subjective:  Follow-up on Type 2 Diabetes/ Hypertension; is complaining of worsening fatigue symptoms in the past month; at last office visit, insulin had to be changed to [place] as insulin due to cost concerns; Crestor was re-started at the end of January- denies any muscle aches; is concerned about lack of response to [place]- up to 24 units and blood sugar was down to 224; this is an improvement compared to last month's readings which were in the 300s;    Objective:  Vitals:   02/16/18 0843  BP: 138/82  Pulse: 80  Temp: 97.8 F (36.6  C)  TempSrc: Oral  SpO2: 96%  Weight: 205 lb (93 kg)  Height: '5\' 7"'$  (1.702 m)    General: Well developed, well nourished, in no acute distress  Skin : Warm and dry.  Head: Normocephalic and atraumatic  Eyes: Sclera and conjunctiva clear; pupils round and reactive to light; extraocular movements intact  Ears: External normal; canals clear; tympanic membranes normal  Oropharynx: Pink, supple. No suspicious lesions  Neck: Supple without thyromegaly, adenopathy  Lungs: Respirations unlabored; clear to auscultation bilaterally without wheeze, rales, rhonchi  CVS exam: normal rate and regular rhythm.  Neurologic: Alert and oriented; speech intact; face symmetrical; moves all extremities well; CNII-XII intact  without focal deficit   Assessment:  1. Uncontrolled type 2 diabetes mellitus with hyperglycemia ([place])   2. Mixed hyperlipidemia   3. Essential hypertension     Plan:  1. Increase insulin by 2 units every 2 days until reaches 30 units of Basaglar at night; may need to add 10 units in the am; she will call back with response in 2 weeks; 2. Hold Crestor for now- ? If fatigue is related to Crestor; 3. Per patient, home readings are averaging 110-130/80-85; encouraged exercise, weight loss; re-check in 3-4 months;   No follow-ups on file.  No orders of the defined types were placed in this encounter.   Requested Prescriptions    No prescriptions requested or ordered in this encounter

## 2018-03-30 ENCOUNTER — Other Ambulatory Visit: Payer: Self-pay | Admitting: Family

## 2018-03-30 ENCOUNTER — Telehealth: Payer: Self-pay | Admitting: Family

## 2018-03-30 MED ORDER — SITAGLIP PHOS-METFORMIN HCL ER 50-1000 MG PO TB24: ORAL_TABLET | ORAL | 1 refills | Status: DC

## 2018-03-30 NOTE — Telephone Encounter (Signed)
Received refill request for Janumet; will sent that in for her but wanted to follow-up on her diabetes.  How much insulin is she doing? How are her sugars? Feeling any better off the Crestor?

## 2018-03-30 NOTE — Telephone Encounter (Signed)
Tried to leave message for patient but her voicemail was full. Created CRM in detail incase she returns call to clinic regarding missed call.

## 2018-04-26 ENCOUNTER — Other Ambulatory Visit: Payer: Self-pay | Admitting: Family

## 2018-04-26 DIAGNOSIS — F329 Major depressive disorder, single episode, unspecified: Secondary | ICD-10-CM

## 2018-04-26 DIAGNOSIS — F419 Anxiety disorder, unspecified: Secondary | ICD-10-CM

## 2018-04-26 DIAGNOSIS — F32A Depression, unspecified: Secondary | ICD-10-CM

## 2018-04-26 MED ORDER — CITALOPRAM HYDROBROMIDE 20 MG PO TABS: 20.0000 mg | ORAL_TABLET | Freq: Every day | ORAL | 1 refills | Status: DC

## 2019-02-20 ENCOUNTER — Other Ambulatory Visit: Payer: Self-pay | Admitting: Family

## 2019-02-20 NOTE — Telephone Encounter (Signed)
She has not been seen in over a year now; her diabetes was not well controlled last year. Is she working with someone else for management? If not, must schedule OV for further refills. I gave her one month of the Janumet XR.

## 2019-02-20 NOTE — Telephone Encounter (Signed)
Left message for patient today that she needs to contact office for OV before any other refills will be provided.

## 2019-03-01 ENCOUNTER — Other Ambulatory Visit: Payer: Self-pay | Admitting: Family

## 2019-03-01 DIAGNOSIS — F419 Anxiety disorder, unspecified: Secondary | ICD-10-CM

## 2019-03-01 DIAGNOSIS — F329 Major depressive disorder, single episode, unspecified: Secondary | ICD-10-CM

## 2019-03-01 DIAGNOSIS — F32A Depression, unspecified: Secondary | ICD-10-CM

## 2019-03-01 MED ORDER — CITALOPRAM HYDROBROMIDE 20 MG PO TABS: 20.0000 mg | ORAL_TABLET | Freq: Every day | ORAL | 0 refills | Status: DC

## 2019-04-27 ENCOUNTER — Other Ambulatory Visit: Payer: Self-pay | Admitting: Family

## 2020-01-26 IMAGING — MG DIGITAL SCREENING BILATERAL MAMMOGRAM WITH TOMO AND CAD
8 series · 8 of 24 positions shown · non-contrast
Comparison: Previous exam(s).

CLINICAL DATA: Screening.

EXAM:
DIGITAL SCREENING BILATERAL MAMMOGRAM WITH TOMO AND CAD

[L CC synth-2D]
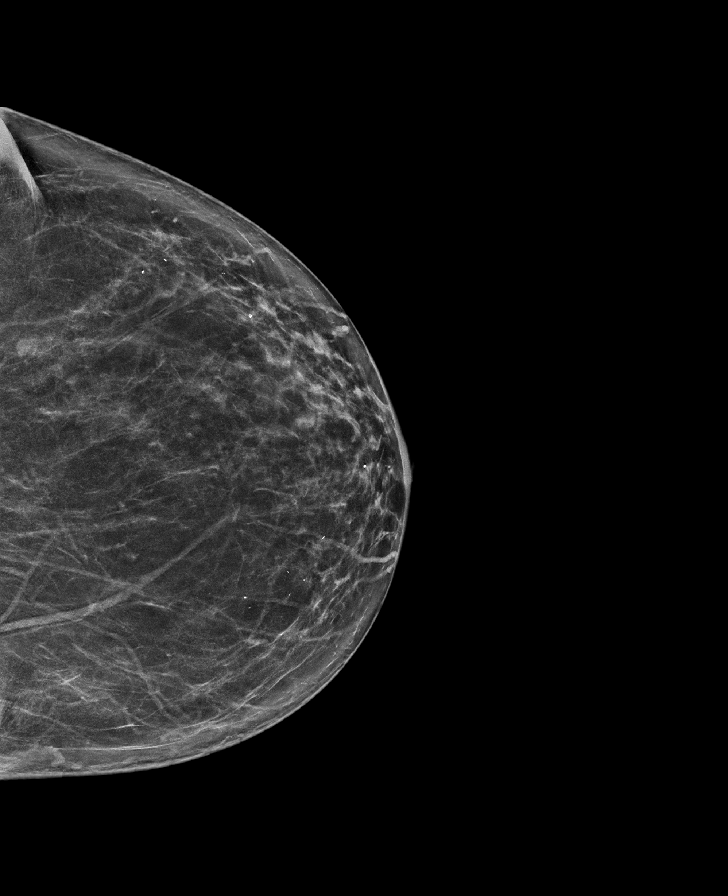

[L MLO synth-2D]
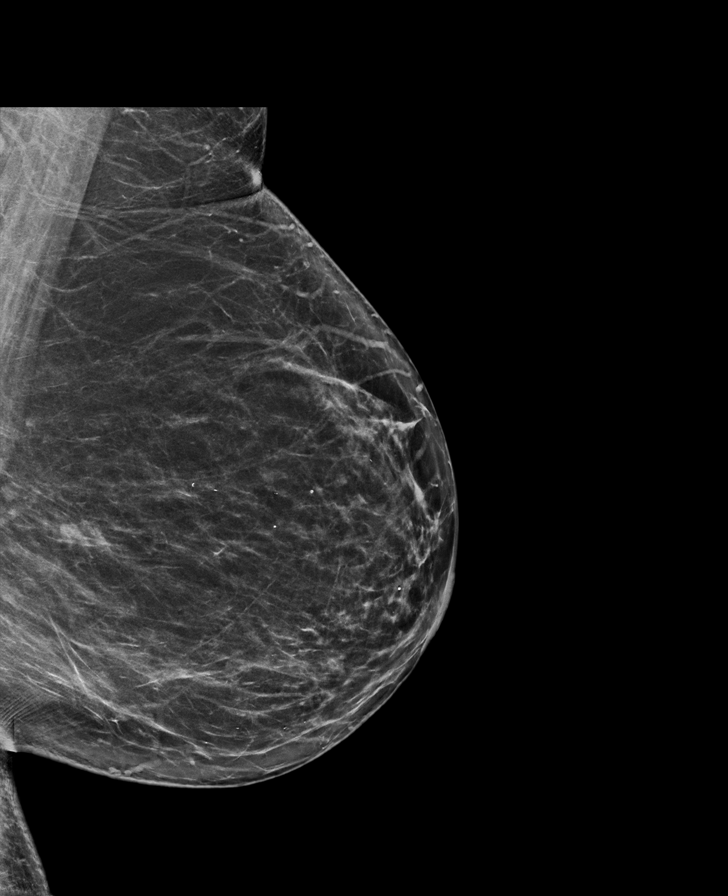

[R CC synth-2D]
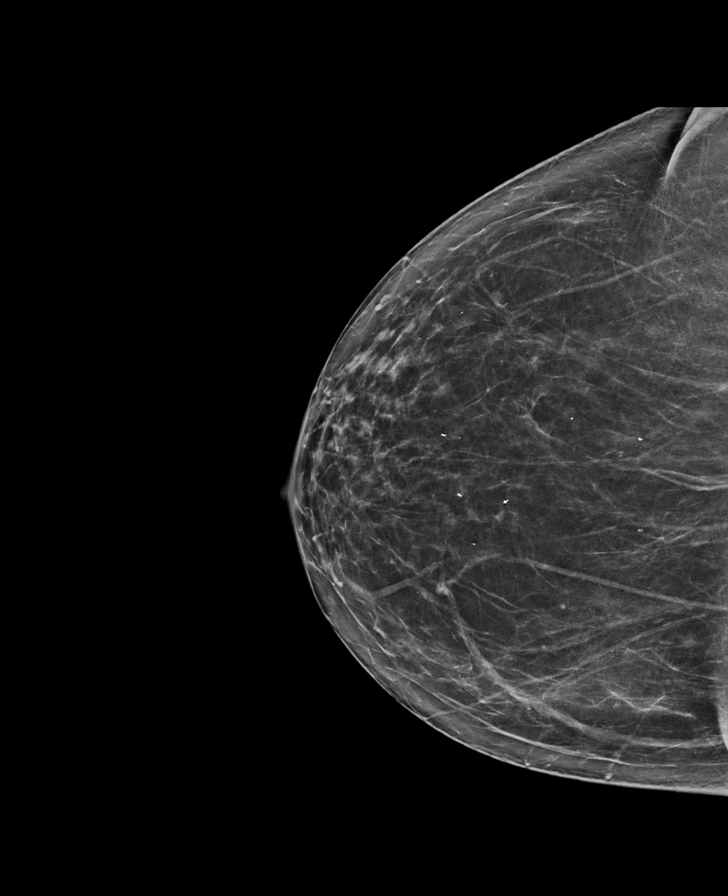

[R MLO synth-2D]
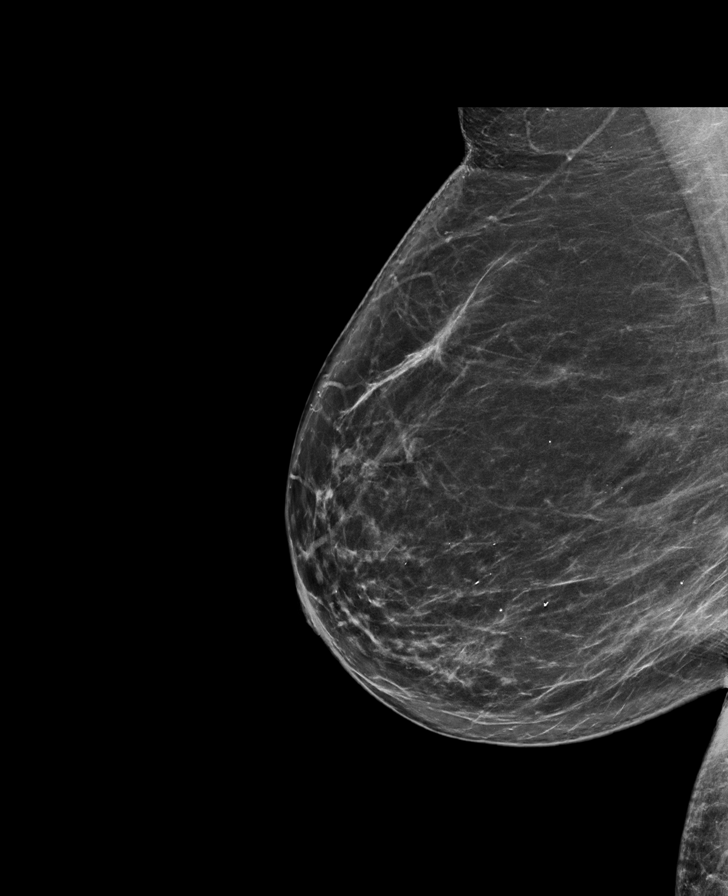

[L MLO tomo · tomo slice 41/81.0]
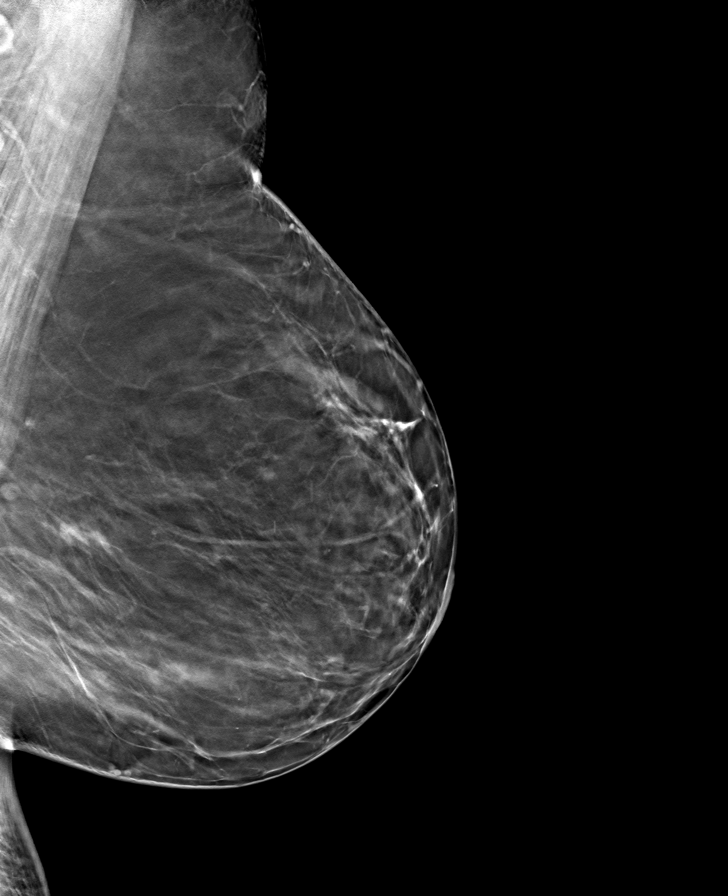

[L CC tomo · tomo slice 37/74.0]
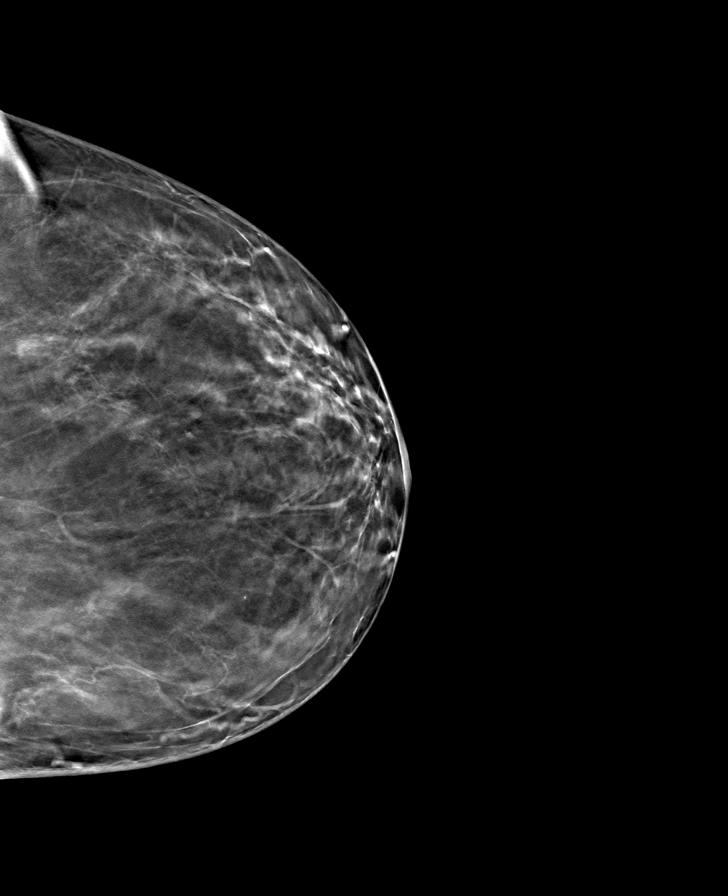

[R CC tomo · tomo slice 37/74.0]
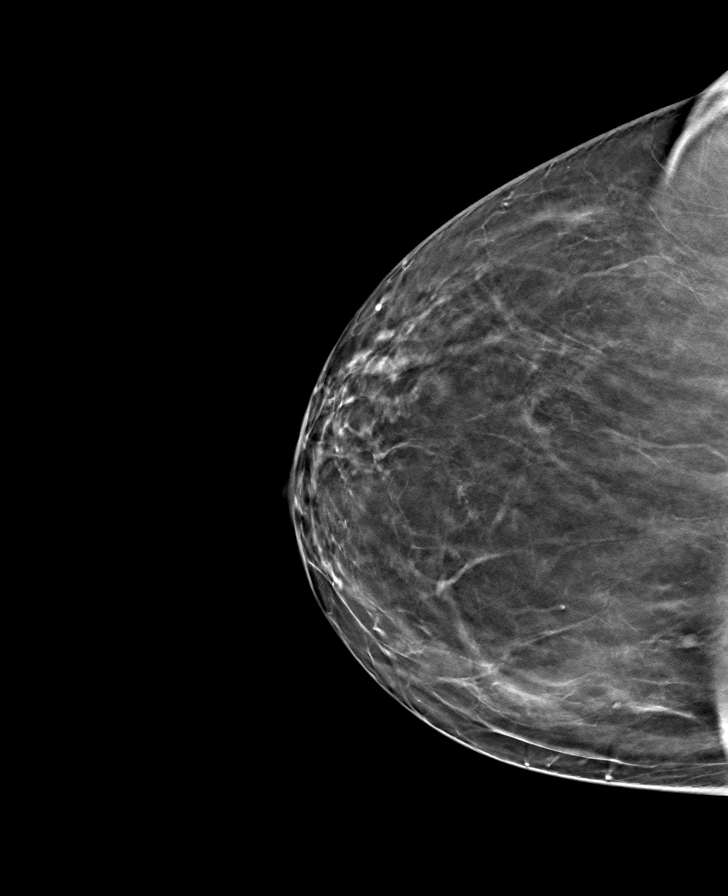

[R MLO tomo · tomo slice 41/80.0]
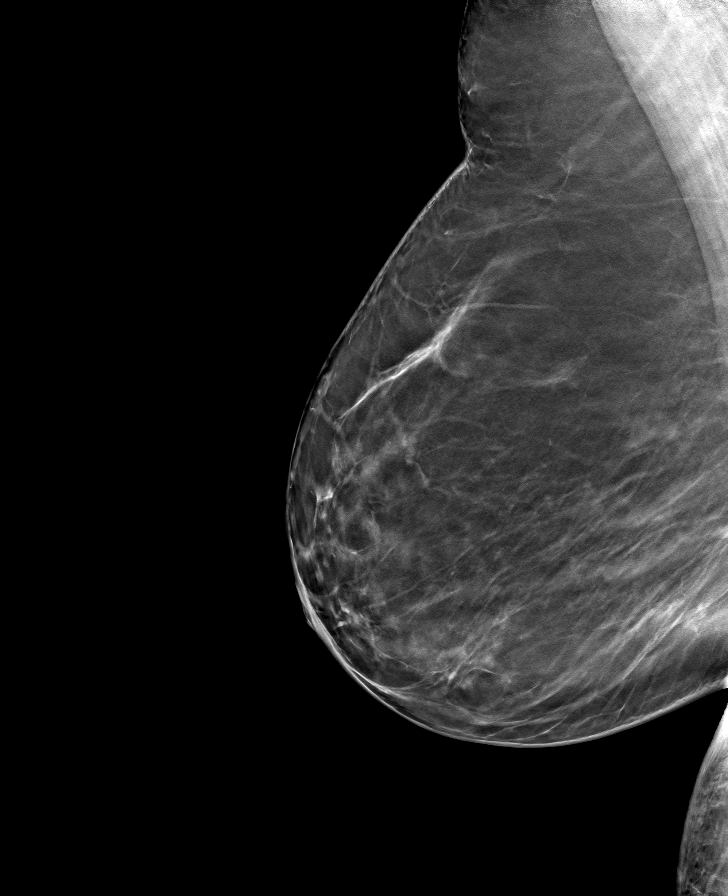

[8 of 24 positions shown; findings below may reference images not displayed]

ACR Breast Density Category b: There are scattered areas of
fibroglandular density.
FINDINGS: In the left breast, a possible mass warrants further evaluation. In
the right breast, no findings suspicious for malignancy.

Images were processed with CAD.
IMPRESSION: Further evaluation is suggested for possible mass in the left
breast.

RECOMMENDATION:
Diagnostic mammogram and possibly ultrasound of the left breast.
(Code:JC-2-SSL)

The patient will be contacted regarding the findings, and additional
imaging will be scheduled.

BI-RADS CATEGORY  0: Incomplete. Need additional imaging evaluation
and/or prior mammograms for comparison.

## 2020-02-16 ENCOUNTER — Emergency Department (HOSPITAL_COMMUNITY): Payer: Managed Care, Other (non HMO)

## 2020-02-16 ENCOUNTER — Inpatient Hospital Stay (HOSPITAL_COMMUNITY)
Admission: EM | Admit: 2020-02-16 | Discharge: 2020-02-19 | DRG: 637 | Disposition: A | Discharge status: Home / Self Care | Payer: Managed Care, Other (non HMO) | Attending: Internal Medicine | Admitting: Internal Medicine

## 2020-02-16 DIAGNOSIS — Z7982 Long term (current) use of aspirin: Secondary | ICD-10-CM | POA: Diagnosis not present

## 2020-02-16 DIAGNOSIS — G9341 Metabolic encephalopathy: Secondary | ICD-10-CM | POA: Diagnosis present

## 2020-02-16 DIAGNOSIS — K5900 Constipation, unspecified: Secondary | ICD-10-CM | POA: Diagnosis present

## 2020-02-16 DIAGNOSIS — Z20822 Contact with and (suspected) exposure to covid-19: Secondary | ICD-10-CM | POA: Diagnosis present

## 2020-02-16 DIAGNOSIS — E1111 Type 2 diabetes mellitus with ketoacidosis with coma: Secondary | ICD-10-CM | POA: Diagnosis not present

## 2020-02-16 DIAGNOSIS — E860 Dehydration: Secondary | ICD-10-CM | POA: Diagnosis present

## 2020-02-16 DIAGNOSIS — Z87891 Personal history of nicotine dependence: Secondary | ICD-10-CM

## 2020-02-16 DIAGNOSIS — Z8249 Family history of ischemic heart disease and other diseases of the circulatory system: Secondary | ICD-10-CM | POA: Diagnosis not present

## 2020-02-16 DIAGNOSIS — B0230 Zoster ocular disease, unspecified: Secondary | ICD-10-CM | POA: Diagnosis not present

## 2020-02-16 DIAGNOSIS — B009 Herpesviral infection, unspecified: Secondary | ICD-10-CM | POA: Diagnosis present

## 2020-02-16 DIAGNOSIS — E870 Hyperosmolality and hypernatremia: Secondary | ICD-10-CM | POA: Diagnosis present

## 2020-02-16 DIAGNOSIS — N179 Acute kidney failure, unspecified: Secondary | ICD-10-CM | POA: Diagnosis present

## 2020-02-16 DIAGNOSIS — Z8616 Personal history of COVID-19: Secondary | ICD-10-CM

## 2020-02-16 DIAGNOSIS — Z87442 Personal history of urinary calculi: Secondary | ICD-10-CM | POA: Diagnosis not present

## 2020-02-16 DIAGNOSIS — H109 Unspecified conjunctivitis: Secondary | ICD-10-CM | POA: Diagnosis present

## 2020-02-16 DIAGNOSIS — B0239 Other herpes zoster eye disease: Secondary | ICD-10-CM | POA: Diagnosis not present

## 2020-02-16 DIAGNOSIS — E875 Hyperkalemia: Secondary | ICD-10-CM | POA: Diagnosis present

## 2020-02-16 DIAGNOSIS — D849 Immunodeficiency, unspecified: Secondary | ICD-10-CM | POA: Diagnosis present

## 2020-02-16 DIAGNOSIS — Z79899 Other long term (current) drug therapy: Secondary | ICD-10-CM

## 2020-02-16 DIAGNOSIS — Z833 Family history of diabetes mellitus: Secondary | ICD-10-CM | POA: Diagnosis not present

## 2020-02-16 DIAGNOSIS — F329 Major depressive disorder, single episode, unspecified: Secondary | ICD-10-CM | POA: Diagnosis present

## 2020-02-16 DIAGNOSIS — K219 Gastro-esophageal reflux disease without esophagitis: Secondary | ICD-10-CM | POA: Diagnosis present

## 2020-02-16 DIAGNOSIS — Z23 Encounter for immunization: Secondary | ICD-10-CM | POA: Diagnosis not present

## 2020-02-16 DIAGNOSIS — Z794 Long term (current) use of insulin: Secondary | ICD-10-CM

## 2020-02-16 DIAGNOSIS — I160 Hypertensive urgency: Secondary | ICD-10-CM | POA: Diagnosis present

## 2020-02-16 DIAGNOSIS — E1110 Type 2 diabetes mellitus with ketoacidosis without coma: Principal | ICD-10-CM | POA: Diagnosis present

## 2020-02-16 DIAGNOSIS — Z7289 Other problems related to lifestyle: Secondary | ICD-10-CM

## 2020-02-16 DIAGNOSIS — T383X6A Underdosing of insulin and oral hypoglycemic [antidiabetic] drugs, initial encounter: Secondary | ICD-10-CM | POA: Diagnosis present

## 2020-02-16 LAB — COMPREHENSIVE METABOLIC PANEL
ALT: 42 U/L (ref 0–44)
AST: 27 U/L (ref 15–41)
Albumin: 4.1 g/dL (ref 3.5–5.0)
Alkaline Phosphatase: 105 U/L (ref 38–126)
Anion gap: 24 — ABNORMAL HIGH (ref 5–15)
BUN: 54 mg/dL — ABNORMAL HIGH (ref 8–23)
CO2: 11 mmol/L — ABNORMAL LOW (ref 22–32)
Calcium: 11.7 mg/dL — ABNORMAL HIGH (ref 8.9–10.3)
Chloride: 101 mmol/L (ref 98–111)
Creatinine, Ser: 1.68 mg/dL — ABNORMAL HIGH (ref 0.44–1.00)
GFR calc Af Amer: 37 mL/min — ABNORMAL LOW (ref >60)
GFR calc non Af Amer: 32 mL/min — ABNORMAL LOW (ref >60)
Glucose, Bld: 989 mg/dL (ref 70–99)
Potassium: 6.1 mmol/L — ABNORMAL HIGH (ref 3.5–5.1)
Sodium: 136 mmol/L (ref 135–145)
Total Bilirubin: 5.1 mg/dL — ABNORMAL HIGH (ref 0.3–1.2)
Total Protein: 8.4 g/dL — ABNORMAL HIGH (ref 6.5–8.1)

## 2020-02-16 LAB — CBC WITH DIFFERENTIAL/PLATELET
Abs Immature Granulocytes: 0.1 10*3/uL — ABNORMAL HIGH (ref 0.00–0.07)
Basophils Absolute: 0.1 10*3/uL (ref 0.0–0.1)
Basophils Relative: 1 %
Eosinophils Absolute: 0 10*3/uL (ref 0.0–0.5)
Eosinophils Relative: 0 %
HCT: 53.7 % — ABNORMAL HIGH (ref 36.0–46.0)
Hemoglobin: 17.9 g/dL — ABNORMAL HIGH (ref 12.0–15.0)
Immature Granulocytes: 1 %
Lymphocytes Relative: 19 %
Lymphs Abs: 2 10*3/uL (ref 0.7–4.0)
MCH: 28.2 pg (ref 26.0–34.0)
MCHC: 33.3 g/dL (ref 30.0–36.0)
MCV: 84.7 fL (ref 80.0–100.0)
Monocytes Absolute: 0.8 10*3/uL (ref 0.1–1.0)
Monocytes Relative: 8 %
Neutro Abs: 7.4 10*3/uL (ref 1.7–7.7)
Neutrophils Relative %: 71 %
Platelets: 358 10*3/uL (ref 150–400)
RBC: 6.34 MIL/uL — ABNORMAL HIGH (ref 3.87–5.11)
RDW: 14.1 % (ref 11.5–15.5)
WBC: 10.4 10*3/uL (ref 4.0–10.5)
nRBC: 0 % (ref 0.0–0.2)

## 2020-02-16 LAB — URINALYSIS, ROUTINE W REFLEX MICROSCOPIC
Bacteria, UA: NONE SEEN
Bilirubin Urine: NEGATIVE
Glucose, UA: >=500 mg/dL — AB
Hgb urine dipstick: NEGATIVE
Ketones, ur: 80 mg/dL — AB
Leukocytes,Ua: NEGATIVE
Nitrite: NEGATIVE
Protein, ur: NEGATIVE mg/dL
Specific Gravity, Urine: 1.026 (ref 1.005–1.030)
pH: 5 (ref 5.0–8.0)

## 2020-02-16 LAB — POCT I-STAT EG7
Acid-base deficit: 15 mmol/L — ABNORMAL HIGH (ref 0.0–2.0)
Bicarbonate: 11.1 mmol/L — ABNORMAL LOW (ref 20.0–28.0)
Calcium, Ion: 1.55 mmol/L (ref 1.15–1.40)
HCT: 53 % — ABNORMAL HIGH (ref 36.0–46.0)
Hemoglobin: 18 g/dL — ABNORMAL HIGH (ref 12.0–15.0)
O2 Saturation: 54 %
Potassium: 4.4 mmol/L (ref 3.5–5.1)
Sodium: 142 mmol/L (ref 135–145)
TCO2: 12 mmol/L — ABNORMAL LOW (ref 22–32)
pCO2, Ven: 27.5 mmHg — ABNORMAL LOW (ref 44.0–60.0)
pH, Ven: 7.212 — ABNORMAL LOW (ref 7.250–7.430)
pO2, Ven: 34 mmHg (ref 32.0–45.0)

## 2020-02-16 LAB — BASIC METABOLIC PANEL
Anion gap: 13 (ref 5–15)
Anion gap: 12 (ref 5–15)
BUN: 45 mg/dL — ABNORMAL HIGH (ref 8–23)
BUN: 43 mg/dL — ABNORMAL HIGH (ref 8–23)
CO2: 16 mmol/L — ABNORMAL LOW (ref 22–32)
CO2: 17 mmol/L — ABNORMAL LOW (ref 22–32)
Calcium: 10.3 mg/dL (ref 8.9–10.3)
Calcium: 10.1 mg/dL (ref 8.9–10.3)
Chloride: 119 mmol/L — ABNORMAL HIGH (ref 98–111)
Chloride: 119 mmol/L — ABNORMAL HIGH (ref 98–111)
Creatinine, Ser: 1.07 mg/dL — ABNORMAL HIGH (ref 0.44–1.00)
Creatinine, Ser: 0.88 mg/dL (ref 0.44–1.00)
GFR calc Af Amer: >60 mL/min (ref >60)
GFR calc Af Amer: >60 mL/min (ref >60)
GFR calc non Af Amer: 54 mL/min — ABNORMAL LOW (ref >60)
GFR calc non Af Amer: >60 mL/min (ref >60)
Glucose, Bld: 352 mg/dL — ABNORMAL HIGH (ref 70–99)
Glucose, Bld: 275 mg/dL — ABNORMAL HIGH (ref 70–99)
Potassium: 3.7 mmol/L (ref 3.5–5.1)
Potassium: 3.7 mmol/L (ref 3.5–5.1)
Sodium: 148 mmol/L — ABNORMAL HIGH (ref 135–145)
Sodium: 148 mmol/L — ABNORMAL HIGH (ref 135–145)

## 2020-02-16 LAB — CBG MONITORING, ED
Glucose-Capillary: >600 mg/dL (ref 70–99)
Glucose-Capillary: >600 mg/dL (ref 70–99)
Glucose-Capillary: >600 mg/dL (ref 70–99)
Glucose-Capillary: >600 mg/dL (ref 70–99)
Glucose-Capillary: >600 mg/dL (ref 70–99)
Glucose-Capillary: >600 mg/dL (ref 70–99)
Glucose-Capillary: 428 mg/dL — ABNORMAL HIGH (ref 70–99)
Glucose-Capillary: 467 mg/dL — ABNORMAL HIGH (ref 70–99)
Glucose-Capillary: 293 mg/dL — ABNORMAL HIGH (ref 70–99)
Glucose-Capillary: 296 mg/dL — ABNORMAL HIGH (ref 70–99)

## 2020-02-16 LAB — GLUCOSE, CAPILLARY
Glucose-Capillary: 209 mg/dL — ABNORMAL HIGH (ref 70–99)
Glucose-Capillary: 257 mg/dL — ABNORMAL HIGH (ref 70–99)
Glucose-Capillary: 266 mg/dL — ABNORMAL HIGH (ref 70–99)

## 2020-02-16 LAB — ETHANOL: Alcohol, Ethyl (B): <10 mg/dL (ref <10)

## 2020-02-16 LAB — LACTIC ACID, PLASMA: Lactic Acid, Venous: 1.5 mmol/L (ref 0.5–1.9)

## 2020-02-16 LAB — BETA-HYDROXYBUTYRIC ACID: Beta-Hydroxybutyric Acid: >8 mmol/L — ABNORMAL HIGH (ref 0.05–0.27)

## 2020-02-16 LAB — SARS CORONAVIRUS 2 BY RT PCR (HOSPITAL ORDER, PERFORMED IN ~~LOC~~ HOSPITAL LAB): SARS Coronavirus 2: NEGATIVE

## 2020-02-16 LAB — HIV ANTIBODY (ROUTINE TESTING W REFLEX): HIV Screen 4th Generation wRfx: NONREACTIVE

## 2020-02-16 MED ORDER — VALACYCLOVIR HCL 500 MG PO TABS
1000.0000 mg | ORAL_TABLET | Freq: Two times a day (BID) | ORAL | Status: DC
  Administered 2020-02-17 – 2020-02-19 (×5): 1000 mg via ORAL
  Filled 2020-02-16 (×7): qty 2

## 2020-02-16 MED ORDER — ASPIRIN EC 81 MG PO TBEC
81.0000 mg | DELAYED_RELEASE_TABLET | Freq: Every day | ORAL | Status: DC
  Administered 2020-02-16 – 2020-02-19 (×4): 81 mg via ORAL
  Filled 2020-02-16 (×4): qty 1

## 2020-02-16 MED ORDER — SODIUM CHLORIDE 0.9 % IV SOLN: INTRAVENOUS | Status: DC

## 2020-02-16 MED ORDER — DEXTROSE 50 % IV SOLN: 0.0000 mL | INTRAVENOUS | Status: DC | PRN

## 2020-02-16 MED ORDER — FLUORESCEIN SODIUM 1 MG OP STRP
1.0000 | ORAL_STRIP | Freq: Once | OPHTHALMIC | Status: AC
  Administered 2020-02-16: 1 via OPHTHALMIC
  Filled 2020-02-16: qty 1

## 2020-02-16 MED ORDER — SODIUM CHLORIDE 0.9 % IV BOLUS
1000.0000 mL | Freq: Once | INTRAVENOUS | Status: AC
  Administered 2020-02-16: 1000 mL via INTRAVENOUS

## 2020-02-16 MED ORDER — DEXTROSE-NACL 5-0.45 % IV SOLN: INTRAVENOUS | Status: DC

## 2020-02-16 MED ORDER — CITALOPRAM HYDROBROMIDE 20 MG PO TABS
20.0000 mg | ORAL_TABLET | Freq: Every day | ORAL | Status: DC
  Administered 2020-02-16 – 2020-02-19 (×4): 20 mg via ORAL
  Filled 2020-02-16 (×3): qty 1
  Filled 2020-02-16: qty 2

## 2020-02-16 MED ORDER — LABETALOL HCL 5 MG/ML IV SOLN
10.0000 mg | INTRAVENOUS | Status: DC | PRN
  Administered 2020-02-16: 10 mg via INTRAVENOUS
  Filled 2020-02-16: qty 4

## 2020-02-16 MED ORDER — INSULIN REGULAR(HUMAN) IN NACL 100-0.9 UT/100ML-% IV SOLN
INTRAVENOUS | Status: DC
  Administered 2020-02-16: 5.5 [IU]/h via INTRAVENOUS
  Filled 2020-02-16: qty 100

## 2020-02-16 MED ORDER — INSULIN REGULAR(HUMAN) IN NACL 100-0.9 UT/100ML-% IV SOLN
INTRAVENOUS | Status: DC
  Administered 2020-02-16: 13 [IU]/h via INTRAVENOUS
  Filled 2020-02-16: qty 100

## 2020-02-16 MED ORDER — INSULIN ASPART 100 UNIT/ML IV SOLN
10.0000 [IU] | Freq: Once | INTRAVENOUS | Status: AC
  Administered 2020-02-16: 10 [IU] via INTRAVENOUS

## 2020-02-16 MED ORDER — TETRACAINE HCL 0.5 % OP SOLN
2.0000 [drp] | Freq: Once | OPHTHALMIC | Status: AC
  Administered 2020-02-16: 2 [drp] via OPHTHALMIC
  Filled 2020-02-16: qty 4

## 2020-02-16 MED ORDER — ENOXAPARIN SODIUM 40 MG/0.4ML ~~LOC~~ SOLN
40.0000 mg | SUBCUTANEOUS | Status: DC
  Administered 2020-02-17 – 2020-02-18 (×2): 40 mg via SUBCUTANEOUS
  Filled 2020-02-16 (×2): qty 0.4

## 2020-02-16 MED ORDER — VALACYCLOVIR HCL 500 MG PO TABS
1000.0000 mg | ORAL_TABLET | Freq: Three times a day (TID) | ORAL | Status: DC
  Filled 2020-02-16 (×2): qty 2

## 2020-02-16 NOTE — ED Provider Notes (Signed)
[facility] Provider Note   CSN: [id number] Arrival date & time: 02/16/20  1108     History No chief complaint on file.   Rebecca Cook is a 64 y.o. female with PMHx HTN, GERD, diabetes who presents to the ED today with complaints of "lethargy" x 1 week. Pt also complains of a cough. She reports she noticed a rash on her forehead 2 days ago and went to [facility] and was diagnosed with shingles as well as a UTI. Pt states she was too tired to go pick up her medications and has not started her abx nor her valcyclovir. Pt states the rash has spread to her nose and around her R eye. She denies any eye pain or blurry vision/double vision. Pt reports similar symptoms of cough and generalized weakness/fatigue when she had "COVID twice last year." Pt reports she had COVID in November as well as in February 2020 (was not tested at this time as there was not a test).   Per chart review - UC visit yesterday Subjective:   Rebecca Cook is a 64 y.o. female who presents for evaluation of influenza like symptoms. Symptoms include chills, headache, myalgias, cough and urinary frequency, elevated glucose (was over 500 today) and constipation and have been present for 1 week . Mekiyah has tried to alleviate the symptoms with acetaminophen, ibuprofen with minimal relief. High risk factors for influenza complications: none. Associated symptoms include congestion. The patient denies constipation, dizziness, rash, diarrhea, dysuria, nausea and vomiting.   Workup included:  POCT urinalysis dipstick  Collection Time: 02/14/20 5:42 PM  Result Value Ref Range  Color, UA Yellow  Clarity, UA Clear  Glucose, UA 500 mg/dL Negative  Bilirubin, UA Small Negative  Ketones, POC 80 mg/dL Negative  Spec Grav, UA 1.015 1.005 - 1.030  Blood, UA Trace-intact Negative  pH, UA 5.0 5.0 - 9.0  Protein, UA 30 mg/dL Negative  Urobilinogen, UA 0.2 mg/dL Negative (0.2 mg/dL)  Leukocytes, UA Negative  Negative  Nitrite, UA Negative Negative  STRIP LOT NUMBER 9,044  STRIP LOT EXPIRATION 11/23/20  POCT WBC  Collection Time: 02/14/20 5:42 PM  Result Value Ref Range  WBC 7.4 3.5 - 10 10e3/ul    The history is provided by the patient and medical records.       Past Medical History:  Diagnosis Date  . Diabetes mellitus, type II ([place])   . Frequent headaches   . GERD (gastroesophageal reflux disease)   . Hypertension   . Kidney stones    None current, last had kidney stones in 1986    Patient Active Problem List   Diagnosis Date Noted  . Hidradenitis axillaris 08/31/2016  . Visit for screening mammogram 08/31/2016  . Impingement syndrome of right shoulder 08/12/2016  . Anxiety and depression 11/19/2015  . Hyperlipidemia 09/02/2014  . Uncontrolled diabetes mellitus ([place]) 11/22/2013  . HTN (hypertension) 11/22/2013    Past Surgical History:  Procedure Laterality Date  . TONSILLECTOMY AND ADENOIDECTOMY  1985  . TUBAL LIGATION  1985     OB History   No obstetric history on file.     Family History  Problem Relation Age of Onset  . Cancer Mother        Lung  . Heart disease Father   . Hypertension Father   . Heart disease Paternal Grandmother        Thyroid  . Hypertension Paternal Grandmother   . Cancer - Other Brother   . Diabetes  Maternal Grandfather     Social History   Tobacco Use  . Smoking status: Former Research scientist (life sciences)  . Smokeless tobacco: Never Used  Substance Use Topics  . Alcohol use: Yes    Alcohol/week: 0.0 standard drinks    Comment: occ  . Drug use: No    Home Medications Prior to Admission medications   Medication Sig Start Date End Date Taking? Authorizing Provider  aspirin EC 81 MG tablet Take 81 mg by mouth daily.    [provider]  Aspirin-Acetaminophen-Caffeine (EXCEDRIN MIGRAINE PO) Take by mouth as needed. OTC    [provider]  blood glucose meter kit and supplies KIT Dispense based on patient and insurance  preference. Use up to four times daily as directed. (FOR ICD-9 250.00, 250.01). 10/17/17   Marrian Salvage, FNP  citalopram (CELEXA) 20 MG tablet Take 1 tablet (20 mg total) by mouth daily. 03/01/19   Marrian Salvage, FNP  Insulin Glargine (BASAGLAR KWIKPEN) 100 UNIT/ML SOPN Inject 0.2 mLs (20 Units total) into the skin at bedtime. 20 units at night as directed; 01/17/18   Marrian Salvage, FNP  Insulin Pen Needle 32G X 6 MM MISC 1 Units by Does not apply route daily. Use daily as directed to inject insulin 01/17/18   Marrian Salvage, FNP  JANUMET XR 50-1000 MG TB24 TAKE 1 TABLET BY MOUTH TWICE A DAY BEFORE LUNCH AND SUPPER 02/20/19   Marrian Salvage, FNP  Loratadine (CLARITIN PO) Take by mouth. OTC    [provider]  losartan-hydrochlorothiazide (HYZAAR) 100-25 MG tablet TAKE 1 TABLET BY MOUTH EVERY DAY 02/02/18   Marrian Salvage, FNP  Multiple Vitamins-Minerals (MULTIVITAMIN PO) Take by mouth daily. OTC    [provider]  rosuvastatin (CRESTOR) 10 MG tablet Take 1 tablet (10 mg total) by mouth daily. 01/17/18   Marrian Salvage, [place]    Allergies    Patient has no known allergies.  Review of Systems   Review of Systems  Constitutional: Positive for fatigue. Negative for fever.  Respiratory: Positive for cough.   Skin: Positive for rash.  Neurological: Positive for headaches.  All other systems reviewed and are negative.   Physical Exam Updated Vital Signs BP (!) 159/94 (BP Location: Right Arm)   Pulse (!) 122   Temp 98.3 F (36.8 C) (Oral)   Resp 15   Ht '5\' 7"'$  (1.702 m)   Wt 85.7 kg   SpO2 98%   BMI 29.60 kg/m   Physical Exam Vitals and nursing note reviewed.  Constitutional:      Appearance: She is ill-appearing.  HENT:     Head: Normocephalic and atraumatic.     Comments: Herpes zoster rash to forehead with + hutchinson's sign and 3 small lesions to right upper eyelid    Right Ear: Tympanic membrane normal.      Left Ear: Tympanic membrane normal.  Eyes:     Extraocular Movements: Extraocular movements intact.     Conjunctiva/sclera: Conjunctivae normal.     Pupils: Pupils are equal, round, and reactive to light.     Comments: Pt too sleepy to perform visual acuity No fluorescein uptake appreciated to R eye  Neck:     Meningeal: Brudzinski's sign and Kernig's sign absent.  Cardiovascular:     Rate and Rhythm: Normal rate and regular rhythm.     Pulses: Normal pulses.  Pulmonary:     Effort: Pulmonary effort is normal.     Breath sounds: Normal breath  sounds. No wheezing, rhonchi or rales.  Abdominal:     Palpations: Abdomen is soft.     Tenderness: There is no abdominal tenderness. There is no guarding or rebound.  Musculoskeletal:     Cervical back: Normal range of motion and neck supple.  Skin:    General: Skin is warm and dry.  Neurological:     Mental Status: She is alert.     Comments: CN 3-12 grossly intact A&O x4 GCS 15 Sensation and strength intact Coordination with finger-to-nose WNL Neg pronator drift     ED Results / Procedures / Treatments   Labs (all labs ordered are listed, but only abnormal results are displayed) Labs Reviewed  COMPREHENSIVE METABOLIC PANEL - Abnormal; Notable for the following components:      Result Value   Potassium 6.1 (*)    CO2 11 (*)    Glucose, Bld 989 (*)    BUN 54 (*)    Creatinine, Ser 1.68 (*)    Calcium 11.7 (*)    Total Protein 8.4 (*)    Total Bilirubin 5.1 (*)    GFR calc non Af Amer 32 (*)    GFR calc Af Amer 37 (*)    Anion gap 24 (*)    All other components within normal limits  CBC WITH DIFFERENTIAL/PLATELET - Abnormal; Notable for the following components:   RBC 6.34 (*)    Hemoglobin 17.9 (*)    HCT 53.7 (*)    Abs Immature Granulocytes 0.10 (*)    All other components within normal limits  URINALYSIS, ROUTINE W REFLEX MICROSCOPIC - Abnormal; Notable for the following components:   Color, Urine STRAW (*)     Glucose, UA >=500 (*)    Ketones, ur 80 (*)    All other components within normal limits  BETA-HYDROXYBUTYRIC ACID - Abnormal; Notable for the following components:   Beta-Hydroxybutyric Acid >8.00 (*)    All other components within normal limits  CBG MONITORING, ED - Abnormal; Notable for the following components:   Glucose-Capillary >600 (*)    All other components within normal limits  CBG MONITORING, ED - Abnormal; Notable for the following components:   Glucose-Capillary >600 (*)    All other components within normal limits  POCT I-STAT EG7 - Abnormal; Notable for the following components:   pH, Ven 7.212 (*)    pCO2, Ven 27.5 (*)    Bicarbonate 11.1 (*)    TCO2 12 (*)    Acid-base deficit 15.0 (*)    Calcium, Ion 1.55 (*)    HCT 53.0 (*)    Hemoglobin 18.0 (*)    All other components within normal limits  CULTURE, BLOOD (ROUTINE X 2)  CULTURE, BLOOD (ROUTINE X 2)  URINE CULTURE  SARS CORONAVIRUS 2 BY RT PCR (HOSPITAL ORDER, [place])  LACTIC ACID, PLASMA  ETHANOL  LACTIC ACID, PLASMA  BLOOD GAS, VENOUS  I-STAT VENOUS BLOOD GAS, ED    EKG EKG Interpretation  Date/Time:  Sunday Feb 16 2020 12:31:59 EDT Ventricular Rate:  123 PR Interval:    QRS Duration: 89 QT Interval:  327 QTC Calculation: 468 R Axis:   60 Text Interpretation: Sinus tachycardia Probable anteroseptal infarct, old Confirmed by Dene Gentry [phone]) on 02/16/2020 12:34:56 PM   Radiology DG Chest Port 1 View  Result Date: 02/16/2020 CLINICAL DATA:  Cough and shortness of breath. EXAM: PORTABLE CHEST 1 VIEW COMPARISON:  None. FINDINGS: The heart size and mediastinal contours are within  normal limits. Both lungs are clear. The visualized skeletal structures are unremarkable. IMPRESSION: No active disease. Electronically Signed   By: Dorise Bullion III M.D   On: 02/16/2020 12:30    Procedures .Critical Care Performed by: Eustaquio Maize, PA-C Authorized by: Eustaquio Maize, PA-C   Critical care provider statement:    Critical care time (minutes):  60   Critical care was necessary to treat or prevent imminent or life-threatening deterioration of the following conditions:  Endocrine crisis   Critical care was time spent personally by me on the following activities:  Discussions with consultants, evaluation of patient's response to treatment, examination of patient, ordering and performing treatments and interventions, ordering and review of laboratory studies, ordering and review of radiographic studies, pulse oximetry, re-evaluation of patient's condition, obtaining history from patient or surrogate and review of old charts   (including critical care time)  Medications Ordered in ED Medications  fluorescein ophthalmic strip 1 strip (has no administration in time range)  tetracaine (PONTOCAINE) 0.5 % ophthalmic solution 2 drop (has no administration in time range)  insulin regular, Cook (MYXREDLIN) 100 units/ 100 mL infusion (13 Units/hr Intravenous New Bag/Given 02/16/20 1452)  0.9 %  sodium chloride infusion (has no administration in time range)  dextrose 5 %-0.45 % sodium chloride infusion (has no administration in time range)  dextrose 50 % solution 0-50 mL (has no administration in time range)  sodium chloride 0.9 % bolus 1,000 mL (has no administration in time range)  sodium chloride 0.9 % bolus 1,000 mL (1,000 mLs Intravenous New Bag/Given 02/16/20 1336)  insulin aspart (novoLOG) injection 10 Units (10 Units Intravenous Given 02/16/20 1334)    ED Course  I have reviewed the triage vital signs and the nursing notes.  Pertinent labs & imaging results that were available during my care of the patient were reviewed by me and considered in my medical decision making (see chart for details).  Clinical Course as of Feb 16 1507  Sun Feb 16, 2020  1238 Glucose-Capillary(!!): >600 [MV]  1433 Beta-Hydroxybutyric Acid(!): >8.00 [MV]    Clinical Course  User Index [MV] Eustaquio Maize, PA-C   MDM Rules/Calculators/A&P                      64 year old female who presents to the ED with multiple complaints.  Reports that she has had cough and felt very fatigued for the past week.  Began noticing a rash on her forehead 2 days ago and treated for zoster at urgent care as well as UTI.  Please see above for additional information in HPI.  Arrival to the ED patient is noted to be tachycardic in the 130s however afebrile 97.7.  Nontachypneic.  She appears very fatigued and ill.  He is however alert and oriented x4.  BG was obtained with greater than 600.  Reports she has been compliant with her insulin at home however there is concern for DKA at this time.  Will start on IV fluids and 10 units of insulin and reevaluate after VBG, CMP, beta hydroxybutyrate acid returns.  Perform fluorescein test to ensure that her zoster has not spread to her eye, no eye pain or visual changes however she does have some lesions around the right eye itself as well as positive Hutchinson sign.   Chest x-ray negative.  EKG with sinus tachycardia. CMP has returned with a glucose of 99, gap of 24, bicarb of 11, potassium 6.1.  Also noted to have creatinine  1.68 and a BUN of 54.  Already receiving fluids.  Waiting VBG at this time, there was a mixup in the lab however will start on Endo tool at this time.  CBC without leukocytosis.  Hemoglobin hematocrit stable however does appear hemoconcentrated, patient receiving fluids. Lactic acid 1.5.  Beta hydryoxy > 8.00 VBG with pH 7.212, bicarb 11, pCO2 27.5, acid base deficit 15.0, and ionized calcium 1.55 - consistent with DKA with respiratory compensation. Will call to admit.   Discussed case with Dr. Tamala Julian [facility] who agrees to accept patient for admission.   This note was prepared using Dragon voice recognition software and may include unintentional dictation errors due to the inherent limitations of voice recognition  software.  Final Clinical Impression(s) / ED Diagnoses Final diagnoses:  Diabetic ketoacidosis without coma associated with type 2 diabetes mellitus ([place])  Herpes zoster with ophthalmic complication, unspecified herpes zoster eye disease  AKI (acute kidney injury) [facility])    Rx / DC Orders ED Discharge Orders    None       Eustaquio Maize, PA-C 02/16/20 1508    Valarie Merino, MD 02/17/20 1535

## 2020-02-16 NOTE — ED Notes (Signed)
Pt had been drinking a pink/ frozen drink with noted copious amounts of emesis all over herself/ gown and bed.  Pt appears altered and difficult to follow commands, was attempting to get out of bed with admissions in the room.  Areas and pt cleansed and return to bed without continued active emesis or non-compliance.  Able to WB and pivot transfer from chair to bed.

## 2020-02-16 NOTE — ED Notes (Signed)
Pt attempting dc IV, and having difficulty keeping arm straight.  L arm splinted and IV secured.  BG remains above 600 and no changes in endo tool at this time.

## 2020-02-16 NOTE — ED Notes (Signed)
Pt more restful at this time.  BG readings remain high.  Provider notified of no changes recommended by endotool.

## 2020-02-16 NOTE — H&P (Signed)
History and Physical    Rebecca Cook:[id number] DOB: 09-15-1956 DOA: 02/16/2020  Referring MD/NP/PA: Eustaquio Maize, PA-C PCP: Marrian Salvage, [place]  Patient coming from: Home  Chief Complaint: Weakness  I have personally briefly reviewed patient's old medical records in [place]   HPI: Rebecca Cook is a 64 y.o. female with medical history significant of hypertension, uncontrolled diabetes mellitus type 2, history of COVID-19, and kidney stones presents with complaints of weakness.  Patient is able to provide some history, but is noted to be altered.  She reportedly developed a rash on her forehead and around the right eye 2 days ago.  Patient had been seen in urgent care on 5/21, diagnosed with shingles and a UTI, but she never picked up the valacyclovir for the antibiotic.  Patient does complain of pain over the right eye, cough, congestion, nausea, vomiting, headache, urinary frequency, and elevated blood sugars at home.  Is not totally clear the patient had been taking her medications as advised.  ED Course: Upon admission into the emergency department patient was noted to be afebrile, heart rates 1 19-1 30, respiration 14-22, blood pressures elevated up to 182/94, and O2 saturations maintained.  Labs significant for WBC 10.4, hemoglobin 18, BUN 54, creatinine 1.68, glucose 989, calcium 11.7, lactic acid 1.5, and anion gap 24.  Chest x-ray was negative for any acute abnormalities.  Fluorescein eye exam had been performed and did not note any lesions on the eye.  Patient had been given 2 L of normal saline, 10 units of insulin, and started on a insulin gtt. [facility] called to admit.   Review of Systems  Unable to perform ROS: Mental status change  Constitutional: Positive for malaise/fatigue. Negative for fever.  HENT: Positive for congestion. Negative for ear discharge.   Eyes: Positive for blurred vision and pain.  Respiratory: Positive for cough. Negative for shortness  of breath.   Cardiovascular: Negative for chest pain and leg swelling.  Gastrointestinal: Positive for constipation, nausea and vomiting.  Genitourinary: Positive for frequency. Negative for urgency.  Musculoskeletal: Negative for falls and myalgias.  Neurological: Positive for dizziness, weakness and headaches. Negative for loss of consciousness.  Endo/Heme/Allergies: Positive for polydipsia.  Psychiatric/Behavioral: Negative for memory loss and substance abuse.    Past Medical History:  Diagnosis Date  . Diabetes mellitus, type II ([place])   . Frequent headaches   . GERD (gastroesophageal reflux disease)   . Hypertension   . Kidney stones    None current, last had kidney stones in 1986    Past Surgical History:  Procedure Laterality Date  . TONSILLECTOMY AND ADENOIDECTOMY  1985  . TUBAL LIGATION  1985     reports that she has quit smoking. She has never used smokeless tobacco. She reports current alcohol use. She reports that she does not use drugs.  No Known Allergies  Family History  Problem Relation Age of Onset  . Cancer Mother        Lung  . Heart disease Father   . Hypertension Father   . Heart disease Paternal Grandmother        Thyroid  . Hypertension Paternal Grandmother   . Cancer - Other Brother   . Diabetes Maternal Grandfather     Prior to Admission medications   Medication Sig Start Date End Date Taking? Authorizing Provider  aspirin EC 81 MG tablet Take 81 mg by mouth daily.    [provider]  Aspirin-Acetaminophen-Caffeine (EXCEDRIN MIGRAINE PO) Take  by mouth as needed. OTC    [provider]  blood glucose meter kit and supplies KIT Dispense based on patient and insurance preference. Use up to four times daily as directed. (FOR ICD-9 250.00, 250.01). 10/17/17   Marrian Salvage, FNP  citalopram (CELEXA) 20 MG tablet Take 1 tablet (20 mg total) by mouth daily. 03/01/19   Marrian Salvage, FNP  Insulin Glargine (BASAGLAR  KWIKPEN) 100 UNIT/ML SOPN Inject 0.2 mLs (20 Units total) into the skin at bedtime. 20 units at night as directed; 01/17/18   Marrian Salvage, FNP  Insulin Pen Needle 32G X 6 MM MISC 1 Units by Does not apply route daily. Use daily as directed to inject insulin 01/17/18   Marrian Salvage, FNP  JANUMET XR 50-1000 MG TB24 TAKE 1 TABLET BY MOUTH TWICE A DAY BEFORE LUNCH AND SUPPER 02/20/19   Marrian Salvage, FNP  Loratadine (CLARITIN PO) Take by mouth. OTC    [provider]  losartan-hydrochlorothiazide (HYZAAR) 100-25 MG tablet TAKE 1 TABLET BY MOUTH EVERY DAY 02/02/18   Marrian Salvage, FNP  Multiple Vitamins-Minerals (MULTIVITAMIN PO) Take by mouth daily. OTC    [provider]  rosuvastatin (CRESTOR) 10 MG tablet Take 1 tablet (10 mg total) by mouth daily. 01/17/18   Marrian Salvage, FNP    Physical Exam:  Constitutional: Elderly female who appears acutely ill Vitals:   02/16/20 1230 02/16/20 1245 02/16/20 1318 02/16/20 1330  BP: (!) 160/80 (!) 166/91  (!) 150/85  Pulse: (!) 124 (!) 126  (!) 127  Resp: 14 20  (!) 22  Temp:      TempSrc:      SpO2: 97% 97% 100% 97%  Weight:      Height:       Eyes: Right pupil was noted to be mildly less reactive to light than the left. Crusted lesions noted on the right side of the forehead and face surrounding eye ENMT: Mucous membranes are dry. Posterior pharynx clear of any exudate or lesions.Normal dentition.   Neck: normal, supple, no masses, no thyromegaly Respiratory: clear to auscultation bilaterally, no wheezing, no crackles. Normal respiratory effort. No accessory muscle use.  Cardiovascular: Tachycardic, no murmurs / rubs / gallops. No extremity edema. 2+ pedal pulses. No carotid bruits.  Abdomen: no tenderness, no masses palpated. No hepatosplenomegaly. Bowel sounds positive.  Musculoskeletal: no clubbing / cyanosis. No joint deformity upper and lower extremities. Good ROM, no contractures.  Normal muscle tone.  Skin: no rashes, lesions, ulcers. No induration Neurologic: CN 2-12 grossly intact. Sensation intact, DTR normal. Strength 5/5 in all 4.  Psychiatric: Lethargic, but easily arousable. Oriented at least to person and place at this time, but patient does appear to be confused.    Labs on Admission: I have personally reviewed following labs and imaging studies  CBC: Recent Labs  Lab 02/16/20 1319 02/16/20 1436  WBC 10.4  --   NEUTROABS 7.4  --   HGB 17.9* 18.0*  HCT 53.7* 53.0*  MCV 84.7  --   PLT 358  --    Basic Metabolic Panel: Recent Labs  Lab 02/16/20 1319 02/16/20 1436  NA 136 142  K 6.1* 4.4  CL 101  --   CO2 11*  --   GLUCOSE 989*  --   BUN 54*  --   CREATININE 1.68*  --   CALCIUM 11.7*  --    GFR: Estimated Creatinine Clearance: 37.5 mL/min (A) (by C-G formula based  on SCr of 1.68 mg/dL (H)). Liver Function Tests: Recent Labs  Lab 02/16/20 1319  AST 27  ALT 42  ALKPHOS 105  BILITOT 5.1*  PROT 8.4*  ALBUMIN 4.1   No results for input(s): LIPASE, AMYLASE in the last 168 hours. No results for input(s): AMMONIA in the last 168 hours. Coagulation Profile: No results for input(s): INR, PROTIME in the last 168 hours. Cardiac Enzymes: No results for input(s): CKTOTAL, CKMB, CKMBINDEX, TROPONINI in the last 168 hours. BNP (last 3 results) No results for input(s): PROBNP in the last 8760 hours. HbA1C: No results for input(s): HGBA1C in the last 72 hours. CBG: Recent Labs  Lab 02/16/20 1234 02/16/20 1414  GLUCAP >600* >600*   Lipid Profile: No results for input(s): CHOL, HDL, LDLCALC, TRIG, CHOLHDL, LDLDIRECT in the last 72 hours. Thyroid Function Tests: No results for input(s): TSH, T4TOTAL, FREET4, T3FREE, THYROIDAB in the last 72 hours. Anemia Panel: No results for input(s): VITAMINB12, FOLATE, FERRITIN, TIBC, IRON, RETICCTPCT in the last 72 hours. Urine analysis:    Component Value Date/Time   COLORURINE STRAW (A)  02/16/2020 1250   APPEARANCEUR CLEAR 02/16/2020 1250   LABSPEC 1.026 02/16/2020 1250   PHURINE 5.0 02/16/2020 1250   GLUCOSEU >=500 (A) 02/16/2020 1250   GLUCOSEU >=1000 (A) 10/07/2014 1002   HGBUR NEGATIVE 02/16/2020 1250   BILIRUBINUR NEGATIVE 02/16/2020 1250   KETONESUR 80 (A) 02/16/2020 1250   PROTEINUR NEGATIVE 02/16/2020 1250   UROBILINOGEN 0.2 10/07/2014 1002   NITRITE NEGATIVE 02/16/2020 1250   LEUKOCYTESUR NEGATIVE 02/16/2020 1250   Sepsis Labs: No results found for this or any previous visit (from the past 240 hour(s)).   Radiological Exams on Admission: DG Chest Port 1 View  Result Date: 02/16/2020 CLINICAL DATA:  Cough and shortness of breath. EXAM: PORTABLE CHEST 1 VIEW COMPARISON:  None. FINDINGS: The heart size and mediastinal contours are within normal limits. Both lungs are clear. The visualized skeletal structures are unremarkable. IMPRESSION: No active disease. Electronically Signed   By: Dorise Bullion III M.D   On: 02/16/2020 12:30    EKG: Independently reviewed.  Sinus tachycardia 124 bpm  Assessment/Plan DKA, type II: Acute.  Patient presents with blood sugar elevated up to 989 with CO2 11, beta hydroxybutyrate >8, and anion gap 24.  Venous pH 7.212 and urinalysis positive for ketones and glucose.  She appears to be uncontrolled at baseline as last hemoglobin A1c noted to be around 11.1 back in 12/2017, but suspect likely onset with shingles outbreak.  Patient had initially been bolused 2 L of normal saline IV fluids, given 10 units of insulin, and placed on a insulin drip.  -Admit to stepdown unit  -Glucose stabilizer protocol initiated  -Bolus additional 1 L of normal saline IV fluids -IV fluids normal saline at 125 mL/h -Serial BMPs, hemoglobin A1c in a.m.  -Correct electrolytes as needed -Monitoring for AG closure and will transition to subcutaneous insulin once able -Diabetes education consulted  Metabolic encephalopathy: Acute.  Patient noted to be  acutely confused and altered on admission.  Suspect multifactorial in the setting of hyperglycemia as well as hypercalcemia -Neurochecks  Shingles of face: Patient noted to have shingles lesions around the eye that appear to be crusting concerning for ophthalmic zoster.  In this acute setting given patient with history of uncontrolled diabetes with treat as immunocompromised host for which even after 72 hours of presentation antiviral therapy is recommended. -Valacyclovir 1000 mg 3 times daily    Hyperkalemia: Acute.  Initial  potassium elevated at 6.1.  Given boluses of IV fluids as well as since. -Will continue to monitor  Hypercalcemia: Acute.  Calcium elevated at 11.7. -IV fluids as seen above -Continue to monitor calcium level with serial BMPs  Acute kidney injury: Patient's baseline creatinine previously noted to be around 0.6-7, but she presents with creatinine elevated up to 1.68 with BUN 54.  Elevated BUN to creatinine ratio suggest prerenal cause of symptoms. -Continue IV fluids as seen above -Continue to monitor kidney function  Hypertensive urgency: Blood pressures noted to be elevated up to 185 Home blood pressure medications include losartan-hydrochlorothiazide 100-'25mg'$   daily. -Hold home blood pressure medications due to AKI -Labetalol IV as needed  Depression -Continue Celexa    DVT prophylaxis: Lovenox Code Status: Full Family Communication: Patient's father updated at bedside Disposition Plan: Possible discharge home in 2 to 3 days Consults called: None Admission status: Inpatient  Norval Morton MD [facility] Pager [phone]   If 7PM-7AM, please contact night-coverage [email or web address] Password [facility]  02/16/2020, 3:03 PM

## 2020-02-16 NOTE — ED Notes (Signed)
attemtped to call report  

## 2020-02-16 NOTE — ED Notes (Signed)
Pt father at bedside at this time.  Able to calm and redirect patient.  Will sit with her at this time.  Pt frequently asking for PO fluids, takes without difficulty but forgets someone gave it to her.

## 2020-02-16 NOTE — ED Notes (Signed)
pt confused, talking to a daughter that is not here.  Redirected that she is in the hospital, and talks to family about their hotel rooms and which ones they are staying in.

## 2020-02-17 ENCOUNTER — Other Ambulatory Visit: Payer: Self-pay

## 2020-02-17 DIAGNOSIS — E1111 Type 2 diabetes mellitus with ketoacidosis with coma: Secondary | ICD-10-CM

## 2020-02-17 LAB — BASIC METABOLIC PANEL
Anion gap: 11 (ref 5–15)
Anion gap: 12 (ref 5–15)
Anion gap: 10 (ref 5–15)
BUN: 40 mg/dL — ABNORMAL HIGH (ref 8–23)
BUN: 35 mg/dL — ABNORMAL HIGH (ref 8–23)
BUN: 27 mg/dL — ABNORMAL HIGH (ref 8–23)
CO2: 20 mmol/L — ABNORMAL LOW (ref 22–32)
CO2: 21 mmol/L — ABNORMAL LOW (ref 22–32)
CO2: 18 mmol/L — ABNORMAL LOW (ref 22–32)
Calcium: 10 mg/dL (ref 8.9–10.3)
Calcium: 9.9 mg/dL (ref 8.9–10.3)
Calcium: 9.5 mg/dL (ref 8.9–10.3)
Chloride: 119 mmol/L — ABNORMAL HIGH (ref 98–111)
Chloride: 119 mmol/L — ABNORMAL HIGH (ref 98–111)
Chloride: 111 mmol/L (ref 98–111)
Creatinine, Ser: 0.78 mg/dL (ref 0.44–1.00)
Creatinine, Ser: 0.76 mg/dL (ref 0.44–1.00)
Creatinine, Ser: 0.74 mg/dL (ref 0.44–1.00)
GFR calc Af Amer: >60 mL/min (ref >60)
GFR calc Af Amer: >60 mL/min (ref >60)
GFR calc Af Amer: >60 mL/min (ref >60)
GFR calc non Af Amer: >60 mL/min (ref >60)
GFR calc non Af Amer: >60 mL/min (ref >60)
GFR calc non Af Amer: >60 mL/min (ref >60)
Glucose, Bld: 214 mg/dL — ABNORMAL HIGH (ref 70–99)
Glucose, Bld: 184 mg/dL — ABNORMAL HIGH (ref 70–99)
Glucose, Bld: 330 mg/dL — ABNORMAL HIGH (ref 70–99)
Potassium: 3.5 mmol/L (ref 3.5–5.1)
Potassium: 3.6 mmol/L (ref 3.5–5.1)
Potassium: 5.2 mmol/L — ABNORMAL HIGH (ref 3.5–5.1)
Sodium: 150 mmol/L — ABNORMAL HIGH (ref 135–145)
Sodium: 152 mmol/L — ABNORMAL HIGH (ref 135–145)
Sodium: 139 mmol/L (ref 135–145)

## 2020-02-17 LAB — URINE CULTURE: Culture: 20000 — AB

## 2020-02-17 LAB — GLUCOSE, CAPILLARY
Glucose-Capillary: 164 mg/dL — ABNORMAL HIGH (ref 70–99)
Glucose-Capillary: 175 mg/dL — ABNORMAL HIGH (ref 70–99)
Glucose-Capillary: 181 mg/dL — ABNORMAL HIGH (ref 70–99)
Glucose-Capillary: 186 mg/dL — ABNORMAL HIGH (ref 70–99)
Glucose-Capillary: 187 mg/dL — ABNORMAL HIGH (ref 70–99)
Glucose-Capillary: 193 mg/dL — ABNORMAL HIGH (ref 70–99)
Glucose-Capillary: 194 mg/dL — ABNORMAL HIGH (ref 70–99)
Glucose-Capillary: 207 mg/dL — ABNORMAL HIGH (ref 70–99)
Glucose-Capillary: 209 mg/dL — ABNORMAL HIGH (ref 70–99)
Glucose-Capillary: 222 mg/dL — ABNORMAL HIGH (ref 70–99)
Glucose-Capillary: 232 mg/dL — ABNORMAL HIGH (ref 70–99)
Glucose-Capillary: 245 mg/dL — ABNORMAL HIGH (ref 70–99)
Glucose-Capillary: 285 mg/dL — ABNORMAL HIGH (ref 70–99)
Glucose-Capillary: 290 mg/dL — ABNORMAL HIGH (ref 70–99)

## 2020-02-17 LAB — HEMOGLOBIN A1C
Hgb A1c MFr Bld: 14.2 % — ABNORMAL HIGH (ref 4.8–5.6)
Mean Plasma Glucose: 361 mg/dL

## 2020-02-17 LAB — BETA-HYDROXYBUTYRIC ACID: Beta-Hydroxybutyric Acid: 1.02 mmol/L — ABNORMAL HIGH (ref 0.05–0.27)

## 2020-02-17 LAB — MRSA PCR SCREENING: MRSA by PCR: NEGATIVE

## 2020-02-17 LAB — PHOSPHORUS: Phosphorus: 2.3 mg/dL — ABNORMAL LOW (ref 2.5–4.6)

## 2020-02-17 LAB — MAGNESIUM: Magnesium: 2.1 mg/dL (ref 1.7–2.4)

## 2020-02-17 MED ORDER — HYDRALAZINE HCL 20 MG/ML IJ SOLN: 10.0000 mg | Freq: Four times a day (QID) | INTRAMUSCULAR | Status: DC | PRN

## 2020-02-17 MED ORDER — INSULIN GLARGINE 100 UNIT/ML ~~LOC~~ SOLN
25.0000 [IU] | Freq: Every day | SUBCUTANEOUS | Status: DC
  Administered 2020-02-17: 25 [IU] via SUBCUTANEOUS
  Filled 2020-02-17 (×2): qty 0.25

## 2020-02-17 MED ORDER — CARVEDILOL 6.25 MG PO TABS
6.2500 mg | ORAL_TABLET | Freq: Two times a day (BID) | ORAL | Status: DC
  Administered 2020-02-17 – 2020-02-19 (×5): 6.25 mg via ORAL
  Filled 2020-02-17 (×5): qty 1

## 2020-02-17 MED ORDER — POTASSIUM CHLORIDE CRYS ER 20 MEQ PO TBCR
40.0000 meq | EXTENDED_RELEASE_TABLET | Freq: Once | ORAL | Status: AC
  Administered 2020-02-17: 40 meq via ORAL
  Filled 2020-02-17: qty 2

## 2020-02-17 MED ORDER — INSULIN ASPART 100 UNIT/ML ~~LOC~~ SOLN
0.0000 [IU] | Freq: Every day | SUBCUTANEOUS | Status: DC
  Administered 2020-02-17: 2 [IU] via SUBCUTANEOUS
  Administered 2020-02-18: 3 [IU] via SUBCUTANEOUS

## 2020-02-17 MED ORDER — INSULIN ASPART 100 UNIT/ML ~~LOC~~ SOLN
0.0000 [IU] | Freq: Three times a day (TID) | SUBCUTANEOUS | Status: DC
  Administered 2020-02-17: 5 [IU] via SUBCUTANEOUS
  Administered 2020-02-17: 8 [IU] via SUBCUTANEOUS
  Administered 2020-02-18 (×3): 5 [IU] via SUBCUTANEOUS
  Administered 2020-02-19: 3 [IU] via SUBCUTANEOUS

## 2020-02-17 MED ORDER — BASAGLAR KWIKPEN 100 UNIT/ML ~~LOC~~ SOPN: 20.0000 [IU] | PEN_INJECTOR | Freq: Every day | SUBCUTANEOUS | Status: DC

## 2020-02-17 MED ORDER — ROSUVASTATIN CALCIUM 5 MG PO TABS
10.0000 mg | ORAL_TABLET | Freq: Every day | ORAL | Status: DC
  Administered 2020-02-17 – 2020-02-19 (×3): 10 mg via ORAL
  Filled 2020-02-17 (×3): qty 2

## 2020-02-17 MED ORDER — SODIUM POLYSTYRENE SULFONATE 15 GM/60ML PO SUSP
15.0000 g | Freq: Once | ORAL | Status: AC
  Administered 2020-02-17: 15 g via ORAL
  Filled 2020-02-17: qty 60

## 2020-02-17 MED ORDER — METFORMIN HCL 500 MG PO TABS
1000.0000 mg | ORAL_TABLET | Freq: Every day | ORAL | Status: DC
  Administered 2020-02-17 – 2020-02-19 (×3): 1000 mg via ORAL
  Filled 2020-02-17 (×3): qty 2

## 2020-02-17 MED ORDER — LINAGLIPTIN 5 MG PO TABS
5.0000 mg | ORAL_TABLET | Freq: Every day | ORAL | Status: DC
  Administered 2020-02-17 – 2020-02-19 (×3): 5 mg via ORAL
  Filled 2020-02-17 (×3): qty 1

## 2020-02-17 MED ORDER — INSULIN GLARGINE 100 UNIT/ML ~~LOC~~ SOLN
25.0000 [IU] | Freq: Every day | SUBCUTANEOUS | Status: DC
  Filled 2020-02-17: qty 0.25

## 2020-02-17 MED ORDER — AMLODIPINE BESYLATE 10 MG PO TABS
10.0000 mg | ORAL_TABLET | Freq: Every day | ORAL | Status: DC
  Administered 2020-02-17 – 2020-02-19 (×3): 10 mg via ORAL
  Filled 2020-02-17 (×3): qty 1

## 2020-02-17 MED ORDER — INSULIN GLARGINE 100 UNIT/ML ~~LOC~~ SOLN
20.0000 [IU] | Freq: Every day | SUBCUTANEOUS | Status: DC
  Filled 2020-02-17: qty 0.2

## 2020-02-17 MED ORDER — PNEUMOCOCCAL VAC POLYVALENT 25 MCG/0.5ML IJ INJ
0.5000 mL | INJECTION | INTRAMUSCULAR | Status: DC
  Filled 2020-02-17: qty 0.5

## 2020-02-17 MED ORDER — SITAGLIP PHOS-METFORMIN HCL ER 50-1000 MG PO TB24: 1.0000 | ORAL_TABLET | Freq: Every day | ORAL | Status: DC

## 2020-02-17 MED ORDER — DEXTROSE 5 % IV SOLN: INTRAVENOUS | Status: DC

## 2020-02-17 MED ORDER — SODIUM CHLORIDE 0.45 % IV SOLN: INTRAVENOUS | Status: AC

## 2020-02-17 MED ORDER — INSULIN ASPART 100 UNIT/ML ~~LOC~~ SOLN
2.0000 [IU] | Freq: Three times a day (TID) | SUBCUTANEOUS | Status: DC
  Administered 2020-02-17 – 2020-02-19 (×6): 2 [IU] via SUBCUTANEOUS

## 2020-02-17 NOTE — Evaluation (Signed)
Physical Therapy Evaluation Patient Details Name: Rebecca Cook MRN: [id number] DOB: 07-18-1956 Today's Date: 02/17/2020   History of Present Illness  64 y.o. female admitted on 02/16/20 for DKA, dehydration, AKI, shingles, hypercalcemia.  Pt with significant PMH of DM2, HTN, depression.  Clinical Impression  Pt is weak and mildly off balance during gait in the hallway.  Per her father's report her cognition is significantly improved over yesterday (but still not quite normal).  Pt would benefit from acute therapy, but will likely not need follow up.  PT will continue to follow acutely for safe mobility progression.   PT to follow acutely for deficits listed below.      Follow Up Recommendations No PT follow up    Equipment Recommendations  None recommended by PT    Recommendations for Other Services   NA    Precautions / Restrictions Precautions Precautions: None      Mobility  Bed Mobility Overal bed mobility: Modified Independent                Transfers Overall transfer level: Needs assistance   Transfers: Sit to/from Stand Sit to Stand: Min guard         General transfer comment: Min guard assist for safety and balance.   Ambulation/Gait Ambulation/Gait assistance: Min guard Gait Distance (Feet): 150 Feet Assistive device: None Gait Pattern/deviations: Step-through pattern;Staggering right;Staggering left Gait velocity: decreased Gait velocity interpretation: 1.31 - 2.62 ft/sec, indicative of limited community ambulator General Gait Details: Pt with mildly staggering gait pattern, improved with increased distance.        Balance Overall balance assessment: Mild deficits observed, not formally tested                                           Pertinent Vitals/Pain Pain Assessment: No/denies pain    Home Living Family/patient expects to be discharged to:: Private residence Living Arrangements: Parent(plans to stay with her dad  for a few days) Available Help at Discharge: Family Type of Home: House       Home Layout: One level Home Equipment: None      Prior Function Level of Independence: Independent         Comments: Pt works for [employer] from her home.      Hand Dominance   Dominant Hand: Right    Extremity/Trunk Assessment   Upper Extremity Assessment Upper Extremity Assessment: Overall WFL for tasks assessed    Lower Extremity Assessment Lower Extremity Assessment: Generalized weakness    Cervical / Trunk Assessment Cervical / Trunk Assessment: Normal  Communication   Communication: No difficulties  Cognition Arousal/Alertness: Awake/alert Behavior During Therapy: WFL for tasks assessed/performed Overall Cognitive Status: Impaired/Different from baseline Area of Impairment: Problem solving                             Problem Solving: Difficulty sequencing;Slow processing General Comments: Pt slow to process, had difficulty to fully sequence when washing her hands at the sink.              Assessment/Plan    PT Assessment Patient needs continued PT services  PT Problem List Decreased strength;Decreased activity tolerance;Decreased balance;Decreased mobility;Decreased cognition       PT Treatment Interventions DME instruction;Gait training;Stair training;Functional mobility training;Therapeutic activities;Therapeutic exercise;Balance training;Cognitive remediation;Patient/family education    PT Goals (  Current goals can be found in the Care Plan section)  Acute Rehab PT Goals Patient Stated Goal: to get stronger PT Goal Formulation: With patient Time For Goal Achievement: 03/02/20 Potential to Achieve Goals: Good    Frequency Min 3X/week           AM-PAC PT "6 Clicks" Mobility  Outcome Measure Help needed turning from your back to your side while in a flat bed without using bedrails?: None Help needed moving from lying on your back to sitting on  the side of a flat bed without using bedrails?: None Help needed moving to and from a bed to a chair (including a wheelchair)?: A Little Help needed standing up from a chair using your arms (e.g., wheelchair or bedside chair)?: A Little Help needed to walk in hospital room?: A Little Help needed climbing 3-5 steps with a railing? : A Little 6 Click Score: 20    End of Session   Activity Tolerance: Patient limited by fatigue Patient left: in bed;with call bell/phone within reach;with bed alarm set   PT Visit Diagnosis: Muscle weakness (generalized) (M62.81);Difficulty in walking, not elsewhere classified (R26.2)    Time: [id number] PT Time Calculation (min) (ACUTE ONLY): 34 min   Charges:   PT Evaluation $PT Eval Moderate Complexity: 1 Mod PT Treatments $Gait Training: 8-22 mins      Verdene Lennert, PT, DPT  Acute Rehabilitation [phone] pager #[phone] office      02/17/2020, 7:30 PM

## 2020-02-17 NOTE — Progress Notes (Signed)
Verbal order from Dr. Candiss Norse for kayexalate 15g once

## 2020-02-17 NOTE — Progress Notes (Signed)
Inpatient Diabetes Program Recommendations  AACE/ADA: New Consensus Statement on Inpatient Glycemic Control (2015)  Target Ranges:  Prepandial:   less than 140 mg/dL      Peak postprandial:   less than 180 mg/dL (1-2 hours)      Critically ill patients:  140 - 180 mg/dL   Lab Results  Component Value Date   GLUCAP 209 (H) 02/17/2020   HGBA1C 14.2 (H) 02/16/2020    Review of Glycemic Control  Diabetes history: DM 2 Outpatient Diabetes medications: Basaglar 20 units, Janumet 50-1000 mg bid  Current orders for Inpatient glycemic control:  Lantus 25 units Novolog 0-15 units tid + hs Novolog 2 units tid Metformin 1000 mg Daily Tradjenta 5 mg Daily  Inpatient Diabetes Program Recommendations:    Spoke with pt about DM control at home. A1c was not resulted when I visited pt. Pt reports if her glucose is below 120 at night she will not consistently take her insulin. Pt reports she has been going to her PCP for DM management and that it is a different provider each time. Pt reports she can afford her insulin and DM supplies. Pt is looking for a new PCP. Encouraged follow up and consistent insulin doses. Pt reports having one insulin pen left.  Pt reports she lives by herself.  Thanks,  Tama Headings RN, MSN, BC-ADM Inpatient Diabetes Coordinator Team Pager [phone] (8a-5p)

## 2020-02-17 NOTE — Progress Notes (Signed)
PROGRESS NOTE                                                                                                                                                                                                             Patient Demographics:    Rebecca Cook, is a 64 y.o. female, DOB - 1956/09/04, DX:[id number]  Admit date - 02/16/2020   Admitting Physician Norval Morton, MD  Outpatient Primary MD for the patient is Valere Dross Marvis Repress, FNP  LOS - 1  CC -fatigue     Brief Narrative  -  Rebecca Cook is a 64 y.o. female with medical history significant of hypertension, uncontrolled diabetes mellitus type 2, history of COVID-19, and kidney stones presents with complaints of weakness and rash on the right side of the face, she was diagnosed with DKA and dehydration along with herpes breakout and admitted to the hospital.   Subjective:    Rebecca Cook today has, No headache, No chest pain, No abdominal pain - No Nausea, No new weakness tingling or numbness, No Cough - SOB.    Assessment  & Plan :     1.  DKA in a patient with DM type II.  She ran out of her insulin and did not call her PCP for refills, she was treated with IV fluids and IV insulin, her gap is closed, will start her on Lantus and sliding scale in couple of hours switch off her insulin drip, has been counseled on compliance and requested to call her PCP today and get her refills sent to her pharmacy.  2.  Shingles breakout on the face.  Lesions seem to be all crusted, agree with IV acyclovir for now.  Continue to monitor.  No eye symptoms.  3.  Severe dehydration with hypernatremia and AKI.  D5W and subsequently half-normal saline and monitor.  4.  Dehydration induced hypercalcemia.  Improving with hydration.  5.  History of depression.  Continue Celexa.  6.  Hypertension.  Currently on Norvasc.  Will add low-dose Coreg for better control.   Condition - Fair  Family Communication  :  None  Code  Status :  Full  Consults  :  None  Procedures  :  None  PUD Prophylaxis : None  Disposition Plan  :    Status is: Inpatient  Remains inpatient appropriate because:IV treatments appropriate due to intensity of illness or inability to take PO   Dispo: The patient is from: Home  Anticipated d/c is to: Home              Anticipated d/c date is: 2 days              Patient currently is not medically stable to d/c.  Getting treatment for DKA and dehydration with IV insulin and IV fluids   DVT Prophylaxis  :  Lovenox   Lab Results  Component Value Date   PLT 358 02/16/2020    Diet :  Diet Order            Diet Carb Modified Fluid consistency: Thin; Room service appropriate? Yes  Diet effective now               Inpatient Medications Scheduled Meds: . amLODipine  10 mg Oral Daily  . aspirin EC  81 mg Oral Daily  . citalopram  20 mg Oral Daily  . enoxaparin (LOVENOX) injection  40 mg Subcutaneous Q24H  . insulin aspart  0-15 Units Subcutaneous TID WC  . insulin aspart  0-5 Units Subcutaneous QHS  . insulin aspart  2 Units Subcutaneous TID WC  . insulin glargine  25 Units Subcutaneous Daily  . linagliptin  5 mg Oral Daily   And  . metFORMIN  1,000 mg Oral Q breakfast  . [START ON 02/18/2020] pneumococcal 23 valent vaccine  0.5 mL Intramuscular Tomorrow-1000  . rosuvastatin  10 mg Oral Daily  . valACYclovir  1,000 mg Oral BID   Continuous Infusions: . sodium chloride    . dextrose 75 mL/hr at 02/17/20 0304  . insulin 5.5 Units/hr (02/17/20 1001)   PRN Meds:.dextrose, hydrALAZINE, labetalol  Antibiotics  :   Anti-infectives (From admission, onward)   Start     Dose/Rate Route Frequency Ordered Stop   02/16/20 1800  valACYclovir (VALTREX) tablet 1,000 mg     1,000 mg Oral 2 times daily 02/16/20 1756     02/16/20 1730  valACYclovir (VALTREX) tablet 1,000 mg  Status:  Discontinued     1,000 mg Oral 3 times daily 02/16/20 1718 02/16/20 1756           Objective:   Vitals:   02/17/20 0344 02/17/20 0400 02/17/20 0418 02/17/20 0817  BP:  (!) 166/84 (!) 154/84 (!) 150/88  Pulse:  96 94 95  Resp:  15 15 15   Temp: 97.9 F (36.6 C) 98 F (36.7 C)  98 F (36.7 C)  TempSrc:  Oral  Oral  SpO2:  96% 97% 96%  Weight:      Height:        SpO2: 96 %  Wt Readings from Last 3 Encounters:  02/16/20 82.1 kg  02/16/18 93 kg  01/17/18 90.3 kg     Intake/Output Summary (Last 24 hours) at 02/17/2020 1031 Last data filed at 02/17/2020 0700 Gross per 24 hour  Intake 3938.18 ml  Output -  Net 3938.18 ml     Physical Exam  Awake Alert, No new F.N deficits, Normal affect [place].AT,PERRAL Supple Neck,No JVD, No cervical lymphadenopathy appriciated.  Symmetrical Chest wall movement, Good air movement bilaterally, CTAB RRR,No Gallops,Rubs or new Murmurs, No Parasternal Heave +ve B.Sounds, Abd Soft, No tenderness, No organomegaly appriciated, No rebound - guarding or rigidity. No Cyanosis, crusted shingles lesion on the right side of the face and forehead    Data Review:    Recent Labs  Lab 02/16/20 1319 02/16/20 1436  WBC 10.4  --   HGB 17.9* 18.0*  HCT 53.7* 53.0*  PLT  358  --   MCV 84.7  --   MCH 28.2  --   MCHC 33.3  --   RDW 14.1  --   LYMPHSABS 2.0  --   MONOABS 0.8  --   EOSABS 0.0  --   BASOSABS 0.1  --     Recent Labs  Lab 02/16/20 1319 02/16/20 1319 02/16/20 1436 02/16/20 1825 02/16/20 2109 02/17/20 0141 02/17/20 0509  NA 136   < > 142 148* 148* 150* 152*  K 6.1*   < > 4.4 3.7 3.7 3.5 3.6  CL 101  --   --  119* 119* 119* 119*  CO2 11*  --   --  16* 17* 20* 21*  GLUCOSE 989*  --   --  352* 275* 214* 184*  BUN 54*  --   --  45* 43* 40* 35*  CREATININE 1.68*  --   --  1.07* 0.88 0.78 0.76  CALCIUM 11.7*  --   --  10.3 10.1 10.0 9.9  AST 27  --   --   --   --   --   --   ALT 42  --   --   --   --   --   --   ALKPHOS 105  --   --   --   --   --   --   BILITOT 5.1*  --   --   --   --   --   --   ALBUMIN 4.1   --   --   --   --   --   --   MG  --   --   --   --   --   --  2.1   < > = values in this interval not displayed.    Recent Labs  Lab 02/16/20 1527  [place]    ------------------------------------------------------------------------------------------------------------------ No results for input(s): CHOL, HDL, LDLCALC, TRIG, CHOLHDL, LDLDIRECT in the last 72 hours.  Lab Results  Component Value Date   HGBA1C 11.1 01/17/2018   ------------------------------------------------------------------------------------------------------------------ No results for input(s): TSH, T4TOTAL, T3FREE, THYROIDAB in the last 72 hours.  Invalid input(s): FREET3 ------------------------------------------------------------------------------------------------------------------ No results for input(s): VITAMINB12, FOLATE, FERRITIN, TIBC, IRON, RETICCTPCT in the last 72 hours.  Coagulation profile No results for input(s): INR, PROTIME in the last 168 hours.  No results for input(s): DDIMER in the last 72 hours.  Cardiac Enzymes No results for input(s): CKMB, TROPONINI, MYOGLOBIN in the last 168 hours.  Invalid input(s): CK ------------------------------------------------------------------------------------------------------------------ No results found for: BNP  Micro Results Recent Results (from the past 240 hour(s))  Urine culture     Status: None (Preliminary result)   Collection Time: 02/16/20 12:41 PM   Specimen: In/Out Cath Urine  Result Value Ref Range Status   Specimen Description IN/OUT CATH URINE  Final   Special Requests NONE  Final   Culture   Final    CULTURE REINCUBATED FOR BETTER GROWTH Performed at [facility], [place] [address], [place], [place] [address]    Report Status PENDING  Incomplete  Blood Culture (routine x 2)     Status: None (Preliminary result)   Collection Time: 02/16/20  1:19 PM   Specimen: BLOOD  Result Value Ref Range Status   Specimen  Description BLOOD LEFT ANTECUBITAL  Final   Special Requests   Final    BOTTLES DRAWN AEROBIC AND ANAEROBIC Blood Culture adequate volume   Culture   Final  NO GROWTH < 24 HOURS Performed at [facility] [address], [place], [place] [address]    Report Status PENDING  Incomplete  Blood Culture (routine x 2)     Status: None (Preliminary result)   Collection Time: 02/16/20  1:27 PM   Specimen: BLOOD RIGHT HAND  Result Value Ref Range Status   Specimen Description BLOOD RIGHT HAND  Final   Special Requests   Final    BOTTLES DRAWN AEROBIC AND ANAEROBIC Blood Culture adequate volume   Culture   Final    NO GROWTH < 24 HOURS Performed at [facility], [place] [address] [place], [place] [address]    Report Status PENDING  Incomplete  SARS Coronavirus 2 by RT PCR (hospital order, performed in [facility] hospital lab) Nasopharyngeal Nasopharyngeal Swab     Status: None   Collection Time: 02/16/20  3:27 PM   Specimen: Nasopharyngeal Swab  Result Value Ref Range Status   SARS Coronavirus 2 NEGATIVE NEGATIVE Final    Comment: (NOTE) SARS-CoV-2 target nucleic acids are NOT DETECTED. The SARS-CoV-2 RNA is generally detectable in upper and lower respiratory specimens during the acute phase of infection. The lowest concentration of SARS-CoV-2 viral copies this assay can detect is 250 copies / mL. A negative result does not preclude SARS-CoV-2 infection and should not be used as the sole basis for treatment or other patient management decisions.  A negative result may occur with improper specimen collection / handling, submission of specimen other than nasopharyngeal swab, presence of viral mutation(s) within the areas targeted by this assay, and inadequate number of viral copies (<250 copies / mL). A negative result must be combined with clinical observations, patient history, and epidemiological information. Fact Sheet for Patients:    StrictlyIdeas.no Fact Sheet for Healthcare Providers: BankingDealers.co.za This test is not yet approved or cleared  by the [place] FDA and has been authorized for detection and/or diagnosis of SARS-CoV-2 by FDA under an Emergency Use Authorization (EUA).  This EUA will remain in effect (meaning this test can be used) for the duration of the COVID-19 declaration under Section 564(b)(1) of the Act, 21 U.S.C. section 360bbb-3(b)(1), unless the authorization is terminated or revoked sooner. Performed at [facility], [place] [address] [place], [place] [address]   MRSA PCR Screening     Status: None   Collection Time: 02/16/20  9:07 PM   Specimen: Nasal Mucosa; Nasopharyngeal  Result Value Ref Range Status   MRSA by PCR NEGATIVE NEGATIVE Final    Comment:        The GeneXpert MRSA Assay (FDA approved for NASAL specimens only), is one component of a comprehensive MRSA colonization surveillance program. It is not intended to diagnose MRSA infection nor to guide or monitor treatment for MRSA infections. Performed at [facility], [place] [address], [place], [place] [address]     Radiology Reports DG Chest [place] 1 View  Result Date: 02/16/2020 CLINICAL DATA:  Cough and shortness of breath. EXAM: PORTABLE CHEST 1 VIEW COMPARISON:  None. FINDINGS: The heart size and mediastinal contours are within normal limits. Both lungs are clear. The visualized skeletal structures are unremarkable. IMPRESSION: No active disease. Electronically Signed   By: Dorise Bullion III M.D   On: 02/16/2020 12:30    Time Spent in minutes  30   Lala Lund M.D on 02/17/2020 at 10:31 AM  To page go to [email or web address] - password [facility]

## 2020-02-17 NOTE — Progress Notes (Signed)
Paged by RN to review BMP. Patient admitted in DKA. AG now resolved and patient has been on D5 1/2NS. Hypernatremia worsening. Will change to D5W only. K replaced. Can change to subQ insulin later this morning and transition off drip.   Barrington Ellison, MD [facility]

## 2020-02-18 LAB — CBC WITH DIFFERENTIAL/PLATELET
Abs Immature Granulocytes: 0.02 10*3/uL (ref 0.00–0.07)
Basophils Absolute: 0 10*3/uL (ref 0.0–0.1)
Basophils Relative: 0 %
Eosinophils Absolute: 0.1 10*3/uL (ref 0.0–0.5)
Eosinophils Relative: 1 %
HCT: 41.1 % (ref 36.0–46.0)
Hemoglobin: 13.9 g/dL (ref 12.0–15.0)
Immature Granulocytes: 0 %
Lymphocytes Relative: 49 %
Lymphs Abs: 4.1 10*3/uL — ABNORMAL HIGH (ref 0.7–4.0)
MCH: 28.2 pg (ref 26.0–34.0)
MCHC: 33.8 g/dL (ref 30.0–36.0)
MCV: 83.4 fL (ref 80.0–100.0)
Monocytes Absolute: 0.6 10*3/uL (ref 0.1–1.0)
Monocytes Relative: 7 %
Neutro Abs: 3.6 10*3/uL (ref 1.7–7.7)
Neutrophils Relative %: 43 %
Platelets: 166 10*3/uL (ref 150–400)
RBC: 4.93 MIL/uL (ref 3.87–5.11)
RDW: 13.8 % (ref 11.5–15.5)
WBC: 8.3 10*3/uL (ref 4.0–10.5)
nRBC: 0 % (ref 0.0–0.2)

## 2020-02-18 LAB — COMPREHENSIVE METABOLIC PANEL
ALT: 25 U/L (ref 0–44)
AST: 24 U/L (ref 15–41)
Albumin: 2.7 g/dL — ABNORMAL LOW (ref 3.5–5.0)
Alkaline Phosphatase: 64 U/L (ref 38–126)
Anion gap: 7 (ref 5–15)
BUN: 19 mg/dL (ref 8–23)
CO2: 21 mmol/L — ABNORMAL LOW (ref 22–32)
Calcium: 8.6 mg/dL — ABNORMAL LOW (ref 8.9–10.3)
Chloride: 110 mmol/L (ref 98–111)
Creatinine, Ser: 0.53 mg/dL (ref 0.44–1.00)
GFR calc Af Amer: >60 mL/min (ref >60)
GFR calc non Af Amer: >60 mL/min (ref >60)
Glucose, Bld: 232 mg/dL — ABNORMAL HIGH (ref 70–99)
Potassium: 3.7 mmol/L (ref 3.5–5.1)
Sodium: 138 mmol/L (ref 135–145)
Total Bilirubin: 1.1 mg/dL (ref 0.3–1.2)
Total Protein: 5.6 g/dL — ABNORMAL LOW (ref 6.5–8.1)

## 2020-02-18 LAB — GLUCOSE, CAPILLARY
Glucose-Capillary: 220 mg/dL — ABNORMAL HIGH (ref 70–99)
Glucose-Capillary: 226 mg/dL — ABNORMAL HIGH (ref 70–99)
Glucose-Capillary: 214 mg/dL — ABNORMAL HIGH (ref 70–99)
Glucose-Capillary: 251 mg/dL — ABNORMAL HIGH (ref 70–99)

## 2020-02-18 LAB — MAGNESIUM: Magnesium: 1.6 mg/dL — ABNORMAL LOW (ref 1.7–2.4)

## 2020-02-18 LAB — HEMOGLOBIN A1C
Hgb A1c MFr Bld: 13.7 % — ABNORMAL HIGH (ref 4.8–5.6)
Mean Plasma Glucose: 346 mg/dL

## 2020-02-18 MED ORDER — INSULIN GLARGINE 100 UNIT/ML ~~LOC~~ SOLN
35.0000 [IU] | Freq: Every day | SUBCUTANEOUS | Status: DC
  Administered 2020-02-18 – 2020-02-19 (×2): 35 [IU] via SUBCUTANEOUS
  Filled 2020-02-18 (×2): qty 0.35

## 2020-02-18 MED ORDER — HYDRALAZINE HCL 50 MG PO TABS
50.0000 mg | ORAL_TABLET | Freq: Three times a day (TID) | ORAL | Status: DC
  Administered 2020-02-18 – 2020-02-19 (×3): 50 mg via ORAL
  Filled 2020-02-18 (×3): qty 1

## 2020-02-18 MED ORDER — INSULIN GLARGINE 100 UNIT/ML ~~LOC~~ SOLN: 35.0000 [IU] | Freq: Every day | SUBCUTANEOUS | 0 refills | Status: DC

## 2020-02-18 MED ORDER — ACETAMINOPHEN 325 MG PO TABS
650.0000 mg | ORAL_TABLET | Freq: Four times a day (QID) | ORAL | Status: DC | PRN
  Administered 2020-02-18 – 2020-02-19 (×2): 650 mg via ORAL
  Filled 2020-02-18 (×2): qty 2

## 2020-02-18 MED ORDER — INSULIN LISPRO 100 UNIT/ML ~~LOC~~ SOLN: SUBCUTANEOUS | 0 refills | Status: DC

## 2020-02-18 MED ORDER — CEPASTAT 14.5 MG MT LOZG
1.0000 | LOZENGE | OROMUCOSAL | Status: DC | PRN
  Filled 2020-02-18: qty 9

## 2020-02-18 MED ORDER — MAGNESIUM SULFATE 2 GM/50ML IV SOLN
2.0000 g | Freq: Once | INTRAVENOUS | Status: AC
  Administered 2020-02-18: 2 g via INTRAVENOUS
  Filled 2020-02-18: qty 50

## 2020-02-18 MED ORDER — MENTHOL 3 MG MT LOZG
1.0000 | LOZENGE | OROMUCOSAL | Status: DC | PRN
  Administered 2020-02-18 (×2): 3 mg via ORAL
  Filled 2020-02-18 (×2): qty 9

## 2020-02-18 NOTE — Plan of Care (Signed)
  Problem: Education: Goal: Knowledge of General Education information will improve Description Including pain rating scale, medication(s)/side effects and non-pharmacologic comfort measures Outcome: Progressing   

## 2020-02-18 NOTE — Progress Notes (Signed)
PROGRESS NOTE                                                                                                                                                                                                             Patient Demographics:    Rebecca Cook, is a 64 y.o. female, DOB - 08-Feb-1956, DX:[id number]  Admit date - 02/16/2020   Admitting Physician Norval Morton, MD  Outpatient Primary MD for the patient is Rebecca Cook, [place]  LOS - 2  CC -fatigue     Brief Narrative  -  Rebecca Cook is a 64 y.o. female with medical history significant of hypertension, uncontrolled diabetes mellitus type 2, history of COVID-19, and kidney stones presents with complaints of weakness and rash on the right side of the face, she was diagnosed with DKA and dehydration along with herpes breakout and admitted to the hospital.   Subjective:    Patient in bed, appears comfortable, denies any headache, no fever, no chest pain or pressure, no shortness of breath , no abdominal pain. No focal weakness.   Assessment  & Plan :     1.  DKA in a patient with DM type II.  She ran out of her insulin and did not call her PCP for refills, she has been counseled on compliance, has been treated appropriately for DKA and DKA has resolved, has been transitioned to Lantus and sliding scale along with oral home agents, dose adjusted on 02/18/2020 for better control, prescription sent to the pharmacy to make sure there are refills when she receives, extremely poor outpatient diabetic control due to hyperglycemia.  Again she has been extensively counseled, will adjust dose further and if stable discharge on 02/19/2020 to home.  Lab Results  Component Value Date   HGBA1C 14.2 (H) 02/16/2020   CBG (last 3)  Recent Labs    02/17/20 1649 02/17/20 2238 02/18/20 0751  GLUCAP 285* 245* 220*    2.  Shingles breakout on the face.  Lesions seem to be all crusted, agree with IV acyclovir for now.   Continue to monitor.  No eye symptoms.  3.  Severe dehydration with hypernatremia and AKI.  Much improved.  4.  Dehydration induced hypercalcemia.  Solved after IV fluids. 5.  History of depression.  Continue Celexa.  6.  Hypertension.  Has been placed on combination of Norvasc, Coreg, hydralazine added for better control.  7.  Hypomagnesemia.  Replaced IV.   Condition - Fair  Family Communication  :  None  Code Status :  Full  Consults  :  None  Procedures  :  None  PUD Prophylaxis : None  Disposition Plan  :    Status is: Inpatient  Remains inpatient appropriate because:IV treatments appropriate due to intensity of illness or inability to take PO   Dispo: The patient is from: Home              Anticipated d/c is to: Home              Anticipated d/c date is: 1 days              Patient currently is not medically stable to d/c.  Adjusting diabetic medications, replacing IV magnesium, extremely brittle diabetic.   DVT Prophylaxis  :  Lovenox   Lab Results  Component Value Date   PLT 166 02/18/2020    Diet :  Diet Order            Diet Carb Modified Fluid consistency: Thin; Room service appropriate? No  Diet effective now               Inpatient Medications Scheduled Meds: . amLODipine  10 mg Oral Daily  . aspirin EC  81 mg Oral Daily  . carvedilol  6.25 mg Oral BID WC  . citalopram  20 mg Oral Daily  . enoxaparin (LOVENOX) injection  40 mg Subcutaneous Q24H  . hydrALAZINE  50 mg Oral Q8H  . insulin aspart  0-15 Units Subcutaneous TID WC  . insulin aspart  0-5 Units Subcutaneous QHS  . insulin aspart  2 Units Subcutaneous TID WC  . insulin glargine  35 Units Subcutaneous Daily  . linagliptin  5 mg Oral Daily   And  . metFORMIN  1,000 mg Oral Q breakfast  . pneumococcal 23 valent vaccine  0.5 mL Intramuscular Tomorrow-1000  . rosuvastatin  10 mg Oral Daily  . valACYclovir  1,000 mg Oral BID   Continuous Infusions:  PRN Meds:.acetaminophen,  dextrose, hydrALAZINE, labetalol, menthol-cetylpyridinium  Antibiotics  :   Anti-infectives (From admission, onward)   Start     Dose/Rate Route Frequency Ordered Stop   02/16/20 1800  valACYclovir (VALTREX) tablet 1,000 mg     1,000 mg Oral 2 times daily 02/16/20 1756     02/16/20 1730  valACYclovir (VALTREX) tablet 1,000 mg  Status:  Discontinued     1,000 mg Oral 3 times daily 02/16/20 1718 02/16/20 1756          Objective:   Vitals:   02/17/20 2241 02/18/20 0010 02/18/20 0400 02/18/20 0813  BP: 137/73  136/63 (!) 151/79  Pulse: 77 86 84   Resp: 20  17   Temp: 97.8 F (36.6 C)  (!) 97.5 F (36.4 C)   TempSrc: Oral  Oral   SpO2: 97% 97% 96%   Weight:      Height:        SpO2: 96 %  Wt Readings from Last 3 Encounters:  02/16/20 82.1 kg  02/16/18 93 kg  01/17/18 90.3 kg     Intake/Output Summary (Last 24 hours) at 02/18/2020 1001 Last data filed at 02/18/2020 0622 Gross per 24 hour  Intake 2742.14 ml  Output 1200 ml  Net 1542.14 ml     Physical Exam  Awake Alert, No new F.N deficits, Normal affect [place].AT,PERRAL Supple Neck,No JVD, No cervical lymphadenopathy appriciated.  Symmetrical Chest wall movement, Good air movement bilaterally, CTAB RRR,No Gallops,  Rubs or new Murmurs, No Parasternal Heave +ve B.Sounds, Abd Soft, No tenderness, No organomegaly appriciated, No rebound - guarding or rigidity. No Cyanosis, crusted shingles lesion on the right side of the face and forehead    Data Review:    Recent Labs  Lab 02/16/20 1319 02/16/20 1436 02/18/20 0321  WBC 10.4  --  8.3  HGB 17.9* 18.0* 13.9  HCT 53.7* 53.0* 41.1  PLT 358  --  166  MCV 84.7  --  83.4  MCH 28.2  --  28.2  MCHC 33.3  --  33.8  RDW 14.1  --  13.8  LYMPHSABS 2.0  --  4.1*  MONOABS 0.8  --  0.6  EOSABS 0.0  --  0.1  BASOSABS 0.1  --  0.0    Recent Labs  Lab 02/16/20 1319 02/16/20 1436 02/16/20 1825 02/16/20 1825 02/16/20 2109 02/17/20 0141 02/17/20 0509 02/17/20 1709  02/18/20 0321  NA 136   < > 148*   < > 148* 150* 152* 139 138  K 6.1*   < > 3.7   < > 3.7 3.5 3.6 5.2* 3.7  CL 101   < > 119*   < > 119* 119* 119* 111 110  CO2 11*   < > 16*   < > 17* 20* 21* 18* 21*  GLUCOSE 989*   < > 352*   < > 275* 214* 184* 330* 232*  BUN 54*   < > 45*   < > 43* 40* 35* 27* 19  CREATININE 1.68*   < > 1.07*   < > 0.88 0.78 0.76 0.74 0.53  CALCIUM 11.7*   < > 10.3   < > 10.1 10.0 9.9 9.5 8.6*  AST 27  --   --   --   --   --   --   --  24  ALT 42  --   --   --   --   --   --   --  25  ALKPHOS 105  --   --   --   --   --   --   --  64  BILITOT 5.1*  --   --   --   --   --   --   --  1.1  ALBUMIN 4.1  --   --   --   --   --   --   --  2.7*  MG  --   --   --   --   --   --  2.1  --  1.6*  HGBA1C  --   --  14.2*  --   --   --   --   --   --    < > = values in this interval not displayed.    Recent Labs  Lab 02/16/20 1527  [place]    ------------------------------------------------------------------------------------------------------------------ No results for input(s): CHOL, HDL, LDLCALC, TRIG, CHOLHDL, LDLDIRECT in the last 72 hours.  Lab Results  Component Value Date   HGBA1C 14.2 (H) 02/16/2020   ------------------------------------------------------------------------------------------------------------------ No results for input(s): TSH, T4TOTAL, T3FREE, THYROIDAB in the last 72 hours.  Invalid input(s): FREET3 ------------------------------------------------------------------------------------------------------------------ No results for input(s): VITAMINB12, FOLATE, FERRITIN, TIBC, IRON, RETICCTPCT in the last 72 hours.  Coagulation profile No results for input(s): INR, PROTIME in the last 168 hours.  No results for input(s): DDIMER in the last 72 hours.  Cardiac Enzymes No results for input(s): CKMB, TROPONINI, MYOGLOBIN in  the last 168 hours.  Invalid input(s):  CK ------------------------------------------------------------------------------------------------------------------ No results found for: BNP  Micro Results Recent Results (from the past 240 hour(s))  Urine culture     Status: Abnormal   Collection Time: 02/16/20 12:41 PM   Specimen: In/Out Cath Urine  Result Value Ref Range Status   Specimen Description IN/OUT CATH URINE  Final   Special Requests NONE  Final   Culture (A)  Final    20,000 COLONIES/mL GROUP B STREP(S.AGALACTIAE)ISOLATED TESTING AGAINST S. AGALACTIAE NOT ROUTINELY PERFORMED DUE TO PREDICTABILITY OF AMP/PEN/VAN SUSCEPTIBILITY. Performed at [facility], [place] [address] [place], [place] [address]    Report Status 02/17/2020 FINAL  Final  Blood Culture (routine x 2)     Status: None (Preliminary result)   Collection Time: 02/16/20  1:19 PM   Specimen: BLOOD  Result Value Ref Range Status   Specimen Description BLOOD LEFT ANTECUBITAL  Final   Special Requests   Final    BOTTLES DRAWN AEROBIC AND ANAEROBIC Blood Culture adequate volume   Culture   Final    NO GROWTH < 24 HOURS Performed at [facility], [place] [address] [place], [place] [address]    Report Status PENDING  Incomplete  Blood Culture (routine x 2)     Status: None (Preliminary result)   Collection Time: 02/16/20  1:27 PM   Specimen: BLOOD RIGHT HAND  Result Value Ref Range Status   Specimen Description BLOOD RIGHT HAND  Final   Special Requests   Final    BOTTLES DRAWN AEROBIC AND ANAEROBIC Blood Culture adequate volume   Culture   Final    NO GROWTH < 24 HOURS Performed at [facility], [place] [address], [place], [place] [address]    Report Status PENDING  Incomplete  SARS Coronavirus 2 by RT PCR (hospital order, performed in [facility] hospital lab) Nasopharyngeal Nasopharyngeal Swab     Status: None   Collection Time: 02/16/20  3:27 PM   Specimen: Nasopharyngeal Swab  Result Value Ref Range Status   SARS Coronavirus 2  NEGATIVE NEGATIVE Final    Comment: (NOTE) SARS-CoV-2 target nucleic acids are NOT DETECTED. The SARS-CoV-2 RNA is generally detectable in upper and lower respiratory specimens during the acute phase of infection. The lowest concentration of SARS-CoV-2 viral copies this assay can detect is 250 copies / mL. A negative result does not preclude SARS-CoV-2 infection and should not be used as the sole basis for treatment or other patient management decisions.  A negative result may occur with improper specimen collection / handling, submission of specimen other than nasopharyngeal swab, presence of viral mutation(s) within the areas targeted by this assay, and inadequate number of viral copies (<250 copies / mL). A negative result must be combined with clinical observations, patient history, and epidemiological information. Fact Sheet for Patients:   StrictlyIdeas.no Fact Sheet for Healthcare Providers: BankingDealers.co.za This test is not yet approved or cleared  by the [place] FDA and has been authorized for detection and/or diagnosis of SARS-CoV-2 by FDA under an Emergency Use Authorization (EUA).  This EUA will remain in effect (meaning this test can be used) for the duration of the COVID-19 declaration under Section 564(b)(1) of the Act, 21 U.S.C. section 360bbb-3(b)(1), unless the authorization is terminated or revoked sooner. Performed at [facility], [place] [address], [place], [place] [address]   MRSA PCR Screening     Status: None   Collection Time: 02/16/20  9:07  PM   Specimen: Nasal Mucosa; Nasopharyngeal  Result Value Ref Range Status   MRSA by PCR NEGATIVE NEGATIVE Final    Comment:        The GeneXpert MRSA Assay (FDA approved for NASAL specimens only), is one component of a comprehensive MRSA colonization surveillance program. It is not intended to diagnose MRSA infection nor to guide or monitor treatment  for MRSA infections. Performed at [facility], [place] [address] [place], [place] [address]     Radiology Reports DG Chest [place] 1 View  Result Date: 02/16/2020 CLINICAL DATA:  Cough and shortness of breath. EXAM: PORTABLE CHEST 1 VIEW COMPARISON:  None. FINDINGS: The heart size and mediastinal contours are within normal limits. Both lungs are clear. The visualized skeletal structures are unremarkable. IMPRESSION: No active disease. Electronically Signed   By: Dorise Bullion III M.D   On: 02/16/2020 12:30    Time Spent in minutes  30   Lala Lund M.D on 02/18/2020 at 10:01 AM  To page go to [email or web address] - password [facility]

## 2020-02-19 DIAGNOSIS — N179 Acute kidney failure, unspecified: Secondary | ICD-10-CM

## 2020-02-19 DIAGNOSIS — B0230 Zoster ocular disease, unspecified: Secondary | ICD-10-CM

## 2020-02-19 DIAGNOSIS — E1110 Type 2 diabetes mellitus with ketoacidosis without coma: Principal | ICD-10-CM

## 2020-02-19 DIAGNOSIS — G9341 Metabolic encephalopathy: Secondary | ICD-10-CM

## 2020-02-19 LAB — BASIC METABOLIC PANEL
Anion gap: 12 (ref 5–15)
BUN: 12 mg/dL (ref 8–23)
CO2: 23 mmol/L (ref 22–32)
Calcium: 8.8 mg/dL — ABNORMAL LOW (ref 8.9–10.3)
Chloride: 102 mmol/L (ref 98–111)
Creatinine, Ser: 0.48 mg/dL (ref 0.44–1.00)
GFR calc Af Amer: >60 mL/min (ref >60)
GFR calc non Af Amer: >60 mL/min (ref >60)
Glucose, Bld: 181 mg/dL — ABNORMAL HIGH (ref 70–99)
Potassium: 3.5 mmol/L (ref 3.5–5.1)
Sodium: 137 mmol/L (ref 135–145)

## 2020-02-19 LAB — MAGNESIUM: Magnesium: 2 mg/dL (ref 1.7–2.4)

## 2020-02-19 LAB — GLUCOSE, CAPILLARY: Glucose-Capillary: 166 mg/dL — ABNORMAL HIGH (ref 70–99)

## 2020-02-19 MED ORDER — CARVEDILOL 6.25 MG PO TABS: 6.2500 mg | ORAL_TABLET | Freq: Two times a day (BID) | ORAL | 0 refills | Status: DC

## 2020-02-19 MED ORDER — BASAGLAR KWIKPEN 100 UNIT/ML ~~LOC~~ SOPN: 35.0000 [IU] | PEN_INJECTOR | Freq: Every day | SUBCUTANEOUS | 1 refills | Status: DC

## 2020-02-19 MED ORDER — VALACYCLOVIR HCL 500 MG PO TABS: 500.0000 mg | ORAL_TABLET | Freq: Two times a day (BID) | ORAL | 0 refills | Status: DC

## 2020-02-19 NOTE — Discharge Instructions (Signed)
Follow with Primary MD Marrian Salvage, FNP in 7 days, follow with your ophthalmologist today  Get CBC, CMP   checked next visit within 1 week by Primary MD    Activity: As tolerated with Full fall precautions use walker/cane & assistance as needed  Disposition Home   Diet: Heart Healthy Low Carb  Accuchecks 4 times/day, Once in AM empty stomach and then before each meal. Log in all results and show them to your Prim.MD in 3 days. If any glucose reading is under 80 or above 300 call your Prim MD immidiately. Follow Low glucose instructions for glucose under 80 as instructed.   Special Instructions: If you have smoked or chewed Tobacco  in the last 2 yrs please stop smoking, stop any regular Alcohol  and or any Recreational drug use.  On your next visit with your primary care physician please Get Medicines reviewed and adjusted.  Please request your Prim.MD to go over all Hospital Tests and Procedure/Radiological results at the follow up, please get all Hospital records sent to your Prim MD by signing hospital release before you go home.  If you experience worsening of your admission symptoms, develop shortness of breath, life threatening emergency, suicidal or homicidal thoughts you must seek medical attention immediately by calling 911 or calling your MD immediately  if symptoms less severe.  You Must read complete instructions/literature along with all the possible adverse reactions/side effects for all the Medicines you take and that have been prescribed to you. Take any new Medicines after you have completely understood and accpet all the possible adverse reactions/side effects.

## 2020-02-19 NOTE — Progress Notes (Signed)
Physical Therapy Discharge Patient Details Name: Rebecca Cook MRN: [id number] DOB: 09/06/1956 Today's Date: 02/19/2020 Time: 4035-2481 PT Time Calculation (min) (ACUTE ONLY): 13 min  Patient discharged from PT services secondary to goals met and no further PT needs identified.  Please see latest therapy progress note for current level of functioning and progress toward goals.    Progress and discharge plan discussed with patient and/or caregiver: Patient/Caregiver agrees with plan   [name], PT, DPT  Acute Rehabilitation Services  Pager: [phone] Office: [phone]      [name] 02/19/2020, 12:14 PM

## 2020-02-19 NOTE — Progress Notes (Signed)
NURSING PROGRESS NOTE  Rebecca Cook [id number] Discharge Data: 02/19/2020 11:20 AM Attending Provider: Thurnell Lose, MD WUJ:[id number], Marvis Repress, FNP     Mignon Pine to be D/C'd Home per MD order.  Discussed with the patient the After Visit Summary and all questions fully answered. All IV's discontinued with no bleeding noted. All belongings returned to patient for patient to take home. Pt taken downstairs via wheelchair accompanied by a staff member.  Last Vital Signs:  Blood pressure 132/68, pulse 92, temperature 98.8 F (37.1 C), temperature source Oral, resp. rate 18, height '5\' 7"'$  (1.702 m), weight 82.1 kg, SpO2 96 %.  Discharge Medication List Allergies as of 02/19/2020   No Known Allergies     Medication List    TAKE these medications   aspirin EC 81 MG tablet Take 81 mg by mouth daily.   Basaglar KwikPen 100 UNIT/ML Inject 0.35 mLs (35 Units total) into the skin at bedtime. 20 units at night as directed; What changed: how much to take   blood glucose meter kit and supplies Kit Dispense based on patient and insurance preference. Use up to four times daily as directed. (FOR ICD-9 250.00, 250.01).   carvedilol 6.25 MG tablet Commonly known as: COREG Take 1 tablet (6.25 mg total) by mouth 2 (two) times daily with a meal.   citalopram 20 MG tablet Commonly known as: CELEXA Take 1 tablet (20 mg total) by mouth daily.   insulin lispro 100 UNIT/ML injection Commonly known as: HumaLOG Before each meal 3 times a day, 140-199 - 2 units, 200-250 - 4 units, 251-299 - 8 units,  300-349 - 10 units,  350 or above 14 units. Insulin PEN if approved, provide syringes and needles if needed for 1 month.   Insulin Pen Needle 32G X 6 MM Misc 1 Units by Does not apply route daily. Use daily as directed to inject insulin   Janumet XR 50-1000 MG Tb24 Generic drug: SitaGLIPtin-MetFORMIN HCl TAKE 1 TABLET BY MOUTH TWICE A DAY BEFORE LUNCH AND SUPPER What changed: See the new  instructions.   losartan-hydrochlorothiazide 100-25 MG tablet Commonly known as: HYZAAR TAKE 1 TABLET BY MOUTH EVERY DAY   MULTIVITAMIN PO Take 1 tablet by mouth daily.   nitrofurantoin (macrocrystal-monohydrate) 100 MG capsule Commonly known as: MACROBID Take 100 mg by mouth 2 (two) times daily.   polyethylene glycol 17 g packet Commonly known as: MIRALAX / GLYCOLAX Take 17 g by mouth daily.   rosuvastatin 10 MG tablet Commonly known as: Crestor Take 1 tablet (10 mg total) by mouth daily.   valACYclovir 500 MG tablet Commonly known as: VALTREX Take 1 tablet (500 mg total) by mouth 2 (two) times daily.

## 2020-02-19 NOTE — Care Management (Signed)
Pt deemed stable for discharge home today.  CM reviewed pt's chart for TOC orders/consults - none found.  CM contacted pt's pharmacy to determine copay for new discharge insulins;  pt's copay for Humalog is $25, pharmacy unable to apply reduced copay benefit for lantus due to the requirement of a prior authorization.  CM informed attending that PA is required [phone]

## 2020-02-19 NOTE — Progress Notes (Signed)
Physical Therapy Treatment and Discharge Patient Details Name: MAITLAND MUHLBAUER MRN: [id number] DOB: 1955-10-04 Today's Date: 02/19/2020    History of Present Illness 64 y.o. female admitted on 02/16/20 for DKA, dehydration, AKI, shingles, hypercalcemia.  Pt with significant PMH of DM2, HTN, depression.    PT Comments    Pt progressing well towards goals. Overall functioning at an independent level and performing DGI tasks without LOB. Pt able to dress mod I this session as well. Cognition improved. No further acute PT needs. Will sign off. If needs change, please re-consult.     Follow Up Recommendations  No PT follow up     Equipment Recommendations  None recommended by PT    Recommendations for Other Services       Precautions / Restrictions Precautions Precautions: None Restrictions Weight Bearing Restrictions: No    Mobility  Bed Mobility Overal bed mobility: Independent                Transfers Overall transfer level: Independent                  Ambulation/Gait Ambulation/Gait assistance: Independent Gait Distance (Feet): 150 Feet Assistive device: None Gait Pattern/deviations: WFL(Within Functional Limits) Gait velocity: WFL    General Gait Details: Good gait speed. Able to perform DGI tasks without LOB.    Stairs             Wheelchair Mobility    Modified Rankin (Stroke Patients Only)       Balance Overall balance assessment: Independent                               Standardized Balance Assessment Standardized Balance Assessment : Dynamic Gait Index   Dynamic Gait Index Level Surface: Normal Change in Gait Speed: Normal Gait with Horizontal Head Turns: Normal Gait with Vertical Head Turns: Normal Gait and Pivot Turn: Normal Step Over Obstacle: Normal Step Around Obstacles: Normal      Cognition Arousal/Alertness: Awake/alert Behavior During Therapy: WFL for tasks assessed/performed Overall Cognitive  Status: Within Functional Limits for tasks assessed                                 General Comments: Pt cognition much improved. Improved sequencing and processing noted.       Exercises      General Comments        Pertinent Vitals/Pain Pain Assessment: No/denies pain    Home Living                      Prior Function            PT Goals (current goals can now be found in the care plan section) Acute Rehab PT Goals Patient Stated Goal: to get stronger PT Goal Formulation: With patient Time For Goal Achievement: 03/02/20 Potential to Achieve Goals: Good Progress towards PT goals: Goals met/education completed, patient discharged from PT    Frequency    Min 3X/week      PT Plan Current plan remains appropriate    Co-evaluation              AM-PAC PT "6 Clicks" Mobility   Outcome Measure  Help needed turning from your back to your side while in a flat bed without using bedrails?: None Help needed moving from lying on your back to sitting  on the side of a flat bed without using bedrails?: None Help needed moving to and from a bed to a chair (including a wheelchair)?: None Help needed standing up from a chair using your arms (e.g., wheelchair or bedside chair)?: None Help needed to walk in hospital room?: None Help needed climbing 3-5 steps with a railing? : A Little 6 Click Score: 23    End of Session   Activity Tolerance: Patient tolerated treatment well Patient left: in bed;with call bell/phone within reach Nurse Communication: Mobility status PT Visit Diagnosis: Muscle weakness (generalized) (M62.81);Difficulty in walking, not elsewhere classified (R26.2)     Time: 6384-6659 PT Time Calculation (min) (ACUTE ONLY): 13 min  Charges:  $Gait Training: 8-22 mins                     Lou Miner, DPT  Acute Rehabilitation Services  Pager: [phone] Office: [phone]    [name] 02/19/2020,  12:13 PM

## 2020-02-19 NOTE — Discharge Summary (Signed)
Rebecca Cook JGG:[id number] DOB: 1956-05-18 DOA: 02/16/2020  PCP: Marrian Salvage, FNP  Admit date: 02/16/2020  Discharge date: 02/19/2020  Admitted From: Home   Disposition:  Home   Recommendations for Outpatient Follow-up:   Follow up with PCP in 1-2 weeks  PCP Please obtain BMP/CBC, 2 view CXR in 1week,  (see Discharge instructions)   PCP Please follow up on the following pending results:    Home Health: None   Equipment/Devices: None  Consultations: None  Discharge Condition: Stable    CODE STATUS: Full    Diet Recommendation: Heart Healthy Low Carb  Diet Order            Diet Carb Modified Fluid consistency: Thin; Room service appropriate? No  Diet effective now               CC - weakness   Brief history of present illness from the day of admission and additional interim summary    Rebecca Omura Jessupis a 64 y.o.femalewith medical history significant ofhypertension, uncontrolled diabetes mellitus type 2, history of COVID-19, and kidney stones presents with complaints of weakness and rash on the right side of the face, she was diagnosed with DKA and dehydration along with herpes breakout and admitted to the hospital.                                                                 Hospital Course   1.  DKA in a patient with DM type II.  She ran out of her insulin and did not call her PCP for refills, she has been counseled on compliance, has been treated appropriately for DKA and DKA has resolved, has been transitioned to Lantus and sliding scale along with oral home agents, dose adjusted on 02/18/2020 for better control, prescription sent to the pharmacy to make sure there are refills when she receives, extremely poor outpatient diabetic control due to hyperglycemia.  Again she has been  extensively counseled, her insulin regimen has been adjusted, have increased her Lantus to 35 units added sliding scale and continued her oral medications, will be discharged home, written instructions on Accu-Cheks, to log the results in a notebook and follow with PCP in a week.  Lab Results  Component Value Date   HGBA1C 13.7 (H) 02/17/2020   CBG (last 3)  Recent Labs    02/18/20 1614 02/18/20 2111 02/19/20 0755  GLUCAP 214* 251* 166*     2.  Shingles breakout on the R. face.  Lesions seem to be all crusted, she was given IV acyclovir here for 3 days and thereafter will be placed on oral valacyclovir for 10 more days, today she has mild conjunctivitis in her right eye and I have requested her to schedule an appointment with her ophthalmologist which she already  has done for 11:00 today, no eye pain or vision problems yet, will have ophthalmologist evaluate within a few hours today.  3.  Severe dehydration with hypernatremia and AKI.    Resolved after hydration with IV fluids.  4.  Dehydration induced hypercalcemia.  Resolved after hydration.  5.  History of depression.  Continue Celexa.  6.  Hypertension.  Stable on present regimen.  Medications adjusted..  7.  Hypomagnesemia.  Replaced IV and now stable.    Discharge diagnosis     Principal Problem:   DKA, type 2 ([place]) Active Problems:   Hyperkalemia   Hypercalcemia   Acute metabolic encephalopathy   AKI (acute kidney injury) ([place])   Shingles of eyelid   Hypertensive urgency    Discharge instructions    Discharge Instructions    Discharge instructions   Complete by: As directed    Follow with Primary MD Marrian Salvage, FNP in 7 days, follow with your ophthalmologist today  Get CBC, CMP   checked next visit within 1 week by Primary MD    Activity: As tolerated with Full fall precautions use walker/cane & assistance as needed  Disposition Home   Diet: Heart Healthy Low Carb  Accuchecks 4  times/day, Once in AM empty stomach and then before each meal. Log in all results and show them to your Prim.MD in 3 days. If any glucose reading is under 80 or above 300 call your Prim MD immidiately. Follow Low glucose instructions for glucose under 80 as instructed.   Special Instructions: If you have smoked or chewed Tobacco  in the last 2 yrs please stop smoking, stop any regular Alcohol  and or any Recreational drug use.  On your next visit with your primary care physician please Get Medicines reviewed and adjusted.  Please request your Prim.MD to go over all Hospital Tests and Procedure/Radiological results at the follow up, please get all Hospital records sent to your Prim MD by signing hospital release before you go home.  If you experience worsening of your admission symptoms, develop shortness of breath, life threatening emergency, suicidal or homicidal thoughts you must seek medical attention immediately by calling 911 or calling your MD immediately  if symptoms less severe.  You Must read complete instructions/literature along with all the possible adverse reactions/side effects for all the Medicines you take and that have been prescribed to you. Take any new Medicines after you have completely understood and accpet all the possible adverse reactions/side effects.   Increase activity slowly   Complete by: As directed       Discharge Medications   Allergies as of 02/19/2020   No Known Allergies     Medication List    TAKE these medications   aspirin EC 81 MG tablet Take 81 mg by mouth daily.   Basaglar KwikPen 100 UNIT/ML Inject 0.35 mLs (35 Units total) into the skin at bedtime. 20 units at night as directed; What changed: how much to take   blood glucose meter kit and supplies Kit Dispense based on patient and insurance preference. Use up to four times daily as directed. (FOR ICD-9 250.00, 250.01).   carvedilol 6.25 MG tablet Commonly known as: COREG Take 1  tablet (6.25 mg total) by mouth 2 (two) times daily with a meal.   citalopram 20 MG tablet Commonly known as: CELEXA Take 1 tablet (20 mg total) by mouth daily.   insulin lispro 100 UNIT/ML injection Commonly known as: HumaLOG Before each meal 3 times a  day, 140-199 - 2 units, 200-250 - 4 units, 251-299 - 8 units,  300-349 - 10 units,  350 or above 14 units. Insulin PEN if approved, provide syringes and needles if needed for 1 month.   Insulin Pen Needle 32G X 6 MM Misc 1 Units by Does not apply route daily. Use daily as directed to inject insulin   Janumet XR 50-1000 MG Tb24 Generic drug: SitaGLIPtin-MetFORMIN HCl TAKE 1 TABLET BY MOUTH TWICE A DAY BEFORE LUNCH AND SUPPER What changed: See the new instructions.   losartan-hydrochlorothiazide 100-25 MG tablet Commonly known as: HYZAAR TAKE 1 TABLET BY MOUTH EVERY DAY   MULTIVITAMIN PO Take 1 tablet by mouth daily.   nitrofurantoin (macrocrystal-monohydrate) 100 MG capsule Commonly known as: MACROBID Take 100 mg by mouth 2 (two) times daily.   polyethylene glycol 17 g packet Commonly known as: MIRALAX / GLYCOLAX Take 17 g by mouth daily.   rosuvastatin 10 MG tablet Commonly known as: Crestor Take 1 tablet (10 mg total) by mouth daily.   valACYclovir 500 MG tablet Commonly known as: VALTREX Take 1 tablet (500 mg total) by mouth 2 (two) times daily.       Follow-up Information    Marrian Salvage, FNP. Schedule an appointment as soon as possible for a visit in 1 week(s).   Specialty: Internal Medicine Contact information: [place] [address] [phone]           Major procedures and Radiology Reports - PLEASE review detailed and final reports thoroughly  -        DG Chest Port 1 View  Result Date: 02/16/2020 CLINICAL DATA:  Cough and shortness of breath. EXAM: PORTABLE CHEST 1 VIEW COMPARISON:  None. FINDINGS: The heart size and mediastinal contours are within normal limits.  Both lungs are clear. The visualized skeletal structures are unremarkable. IMPRESSION: No active disease. Electronically Signed   By: Dorise Bullion III M.D   On: 02/16/2020 12:30    Micro Results    Recent Results (from the past 240 hour(s))  Urine culture     Status: Abnormal   Collection Time: 02/16/20 12:41 PM   Specimen: In/Out Cath Urine  Result Value Ref Range Status   Specimen Description IN/OUT CATH URINE  Final   Special Requests NONE  Final   Culture (A)  Final    20,000 COLONIES/mL GROUP B STREP(S.AGALACTIAE)ISOLATED TESTING AGAINST S. AGALACTIAE NOT ROUTINELY PERFORMED DUE TO PREDICTABILITY OF AMP/PEN/VAN SUSCEPTIBILITY. Performed at [facility], [place] [address] [place], [place] [address]    Report Status 02/17/2020 FINAL  Final  Blood Culture (routine x 2)     Status: None (Preliminary result)   Collection Time: 02/16/20  1:19 PM   Specimen: BLOOD  Result Value Ref Range Status   Specimen Description BLOOD LEFT ANTECUBITAL  Final   Special Requests   Final    BOTTLES DRAWN AEROBIC AND ANAEROBIC Blood Culture adequate volume   Culture   Final    NO GROWTH 2 DAYS Performed at [facility], [place] [address] [place], [place] [address]    Report Status PENDING  Incomplete  Blood Culture (routine x 2)     Status: None (Preliminary result)   Collection Time: 02/16/20  1:27 PM   Specimen: BLOOD RIGHT HAND  Result Value Ref Range Status   Specimen Description BLOOD RIGHT HAND  Final   Special Requests   Final    BOTTLES DRAWN AEROBIC AND ANAEROBIC Blood  Culture adequate volume   Culture   Final    NO GROWTH 2 DAYS Performed at [facility], [place] [address], [place], [place] [address]    Report Status PENDING  Incomplete  SARS Coronavirus 2 by RT PCR (hospital order, performed in [facility] hospital lab) Nasopharyngeal Nasopharyngeal Swab     Status: None   Collection Time: 02/16/20  3:27 PM   Specimen: Nasopharyngeal Swab  Result Value Ref Range  Status   SARS Coronavirus 2 NEGATIVE NEGATIVE Final    Comment: (NOTE) SARS-CoV-2 target nucleic acids are NOT DETECTED. The SARS-CoV-2 RNA is generally detectable in upper and lower respiratory specimens during the acute phase of infection. The lowest concentration of SARS-CoV-2 viral copies this assay can detect is 250 copies / mL. A negative result does not preclude SARS-CoV-2 infection and should not be used as the sole basis for treatment or other patient management decisions.  A negative result may occur with improper specimen collection / handling, submission of specimen other than nasopharyngeal swab, presence of viral mutation(s) within the areas targeted by this assay, and inadequate number of viral copies (<250 copies / mL). A negative result must be combined with clinical observations, patient history, and epidemiological information. Fact Sheet for Patients:   StrictlyIdeas.no Fact Sheet for Healthcare Providers: BankingDealers.co.za This test is not yet approved or cleared  by the [place] FDA and has been authorized for detection and/or diagnosis of SARS-CoV-2 by FDA under an Emergency Use Authorization (EUA).  This EUA will remain in effect (meaning this test can be used) for the duration of the COVID-19 declaration under Section 564(b)(1) of the Act, 21 U.S.C. section 360bbb-3(b)(1), unless the authorization is terminated or revoked sooner. Performed at [facility], [place] [address], [place], [place] [address]   MRSA PCR Screening     Status: None   Collection Time: 02/16/20  9:07 PM   Specimen: Nasal Mucosa; Nasopharyngeal  Result Value Ref Range Status   MRSA by PCR NEGATIVE NEGATIVE Final    Comment:        The GeneXpert MRSA Assay (FDA approved for NASAL specimens only), is one component of a comprehensive MRSA colonization surveillance program. It is not intended to diagnose MRSA infection nor to  guide or monitor treatment for MRSA infections. Performed at [facility], [place] [address], [place], [place] [address]     Today   Subjective    Santasia Rew today has no headache,no chest abdominal pain,no new weakness tingling or numbness, feels much better wants to go home today.    Objective   Blood pressure 132/68, pulse 92, temperature 98.8 F (37.1 C), temperature source Oral, resp. rate 18, height '5\' 7"'$  (1.702 m), weight 82.1 kg, SpO2 96 %.   Intake/Output Summary (Last 24 hours) at 02/19/2020 0908 Last data filed at 02/19/2020 9147 Gross per 24 hour  Intake 780 ml  Output --  Net 780 ml    Exam  Awake Alert, No new F.N deficits, Normal affect [place].AT,PERRAL Supple Neck,No JVD, No cervical lymphadenopathy appriciated.  Symmetrical Chest wall movement, Good air movement bilaterally, CTAB RRR,No Gallops,Rubs or new Murmurs, No Parasternal Heave +ve B.Sounds, Abd Soft, Non tender, No organomegaly appriciated, No rebound -guarding or rigidity. No Cyanosis, lesions on the right side of her face, mild right eye conjunctivitis, no photophobia   Data Review   CBC w Diff:  Lab Results  Component Value Date   WBC 8.3 02/18/2020   HGB  13.9 02/18/2020   HCT 41.1 02/18/2020   PLT 166 02/18/2020   LYMPHOPCT 49 02/18/2020   MONOPCT 7 02/18/2020   EOSPCT 1 02/18/2020   BASOPCT 0 02/18/2020    CMP:  Lab Results  Component Value Date   NA 137 02/19/2020   K 3.5 02/19/2020   CL 102 02/19/2020   CO2 23 02/19/2020   BUN 12 02/19/2020   CREATININE 0.48 02/19/2020   PROT 5.6 (L) 02/18/2020   ALBUMIN 2.7 (L) 02/18/2020   BILITOT 1.1 02/18/2020   ALKPHOS 64 02/18/2020   AST 24 02/18/2020   ALT 25 02/18/2020  .   Total Time in preparing paper work, data evaluation and todays exam - 22 minutes  Lala Lund M.D on 02/19/2020 at 9:08 AM  [facility]   Office  [phone]

## 2020-02-19 NOTE — Plan of Care (Signed)
  Problem: Education: Goal: Knowledge of General Education information will improve Description: Including pain rating scale, medication(s)/side effects and non-pharmacologic comfort measures Outcome: Adequate for Discharge   Problem: Health Behavior/Discharge Planning: Goal: Ability to manage health-related needs will improve Outcome: Adequate for Discharge   Problem: Clinical Measurements: Goal: Ability to maintain clinical measurements within normal limits will improve Outcome: Adequate for Discharge   Problem: Clinical Measurements: Goal: Will remain free from infection Outcome: Adequate for Discharge   Problem: Clinical Measurements: Goal: Diagnostic test results will improve Outcome: Adequate for Discharge   Problem: Clinical Measurements: Goal: Respiratory complications will improve Outcome: Adequate for Discharge   Problem: Clinical Measurements: Goal: Cardiovascular complication will be avoided Outcome: Adequate for Discharge   Problem: Activity: Goal: Risk for activity intolerance will decrease Outcome: Adequate for Discharge   Problem: Nutrition: Goal: Adequate nutrition will be maintained Outcome: Adequate for Discharge   Problem: Coping: Goal: Level of anxiety will decrease Outcome: Adequate for Discharge   Problem: Elimination: Goal: Will not experience complications related to bowel motility Outcome: Adequate for Discharge   Problem: Elimination: Goal: Will not experience complications related to urinary retention Outcome: Adequate for Discharge   Problem: Pain Managment: Goal: General experience of comfort will improve Outcome: Adequate for Discharge   Problem: Safety: Goal: Ability to remain free from injury will improve Outcome: Adequate for Discharge   Problem: Skin Integrity: Goal: Risk for impaired skin integrity will decrease Outcome: Adequate for Discharge   Problem: Education: Goal: Ability to describe self-care measures that may  prevent or decrease complications (Diabetes Survival Skills Education) will improve Outcome: Adequate for Discharge   Problem: Education: Goal: Individualized Educational Video(s) Outcome: Adequate for Discharge   Problem: Cardiac: Goal: Ability to maintain an adequate cardiac output will improve Outcome: Adequate for Discharge   Problem: Health Behavior/Discharge Planning: Goal: Ability to identify and utilize available resources and services will improve Outcome: Adequate for Discharge   Problem: Health Behavior/Discharge Planning: Goal: Ability to manage health-related needs will improve Outcome: Adequate for Discharge   Problem: Nutritional: Goal: Maintenance of adequate nutrition will improve Outcome: Adequate for Discharge   Problem: Nutritional: Goal: Maintenance of adequate weight for body size and type will improve Outcome: Adequate for Discharge   Problem: Respiratory: Goal: Will regain and/or maintain adequate ventilation Outcome: Adequate for Discharge   Problem: Urinary Elimination: Goal: Ability to achieve and maintain adequate renal perfusion and functioning will improve Outcome: Adequate for Discharge

## 2020-02-21 LAB — CULTURE, BLOOD (ROUTINE X 2)
Culture: NO GROWTH
Culture: NO GROWTH
Special Requests: ADEQUATE
Special Requests: ADEQUATE

## 2020-03-02 ENCOUNTER — Ambulatory Visit: Payer: Managed Care, Other (non HMO) | Admitting: Family

## 2020-03-02 ENCOUNTER — Other Ambulatory Visit: Payer: Self-pay

## 2020-03-02 ENCOUNTER — Encounter: Payer: Self-pay | Admitting: Family

## 2020-03-02 ENCOUNTER — Ambulatory Visit (INDEPENDENT_AMBULATORY_CARE_PROVIDER_SITE_OTHER): Payer: Managed Care, Other (non HMO)

## 2020-03-02 VITALS — BP 160/92 | HR 87 | Temp 98.2°F | Wt 182.2 lb

## 2020-03-02 DIAGNOSIS — R0789 Other chest pain: Secondary | ICD-10-CM | POA: Diagnosis not present

## 2020-03-02 DIAGNOSIS — B0229 Other postherpetic nervous system involvement: Secondary | ICD-10-CM | POA: Diagnosis not present

## 2020-03-02 DIAGNOSIS — E1165 Type 2 diabetes mellitus with hyperglycemia: Secondary | ICD-10-CM | POA: Diagnosis not present

## 2020-03-02 LAB — CBC WITH DIFFERENTIAL/PLATELET
Basophils Absolute: 0.1 10*3/uL (ref 0.0–0.1)
Basophils Relative: 1.5 % (ref 0.0–3.0)
Eosinophils Absolute: 0.2 10*3/uL (ref 0.0–0.7)
Eosinophils Relative: 3 % (ref 0.0–5.0)
HCT: 44.8 % (ref 36.0–46.0)
Hemoglobin: 15 g/dL (ref 12.0–15.0)
Lymphocytes Relative: 37.1 % (ref 12.0–46.0)
Lymphs Abs: 2.6 10*3/uL (ref 0.7–4.0)
MCHC: 33.5 g/dL (ref 30.0–36.0)
MCV: 85.2 fl (ref 78.0–100.0)
Monocytes Absolute: 0.5 10*3/uL (ref 0.1–1.0)
Monocytes Relative: 7.3 % (ref 3.0–12.0)
Neutro Abs: 3.6 10*3/uL (ref 1.4–7.7)
Neutrophils Relative %: 51.1 % (ref 43.0–77.0)
Platelets: 245 10*3/uL (ref 150.0–400.0)
RBC: 5.26 Mil/uL — ABNORMAL HIGH (ref 3.87–5.11)
RDW: 14.6 % (ref 11.5–15.5)
WBC: 7.1 10*3/uL (ref 4.0–10.5)

## 2020-03-02 LAB — COMPREHENSIVE METABOLIC PANEL
ALT: 23 U/L (ref 0–35)
AST: 20 U/L (ref 0–37)
Albumin: 4 g/dL (ref 3.5–5.2)
Alkaline Phosphatase: 69 U/L (ref 39–117)
BUN: 9 mg/dL (ref 6–23)
CO2: 27 mEq/L (ref 19–32)
Calcium: 9.5 mg/dL (ref 8.4–10.5)
Chloride: 97 mEq/L (ref 96–112)
Creatinine, Ser: 0.63 mg/dL (ref 0.40–1.20)
GFR: 94.7 mL/min (ref >60.00)
Glucose, Bld: 376 mg/dL — ABNORMAL HIGH (ref 70–99)
Potassium: 3.5 mEq/L (ref 3.5–5.1)
Sodium: 132 mEq/L — ABNORMAL LOW (ref 135–145)
Total Bilirubin: 0.7 mg/dL (ref 0.2–1.2)
Total Protein: 7 g/dL (ref 6.0–8.3)

## 2020-03-02 MED ORDER — JANUMET XR 50-1000 MG PO TB24: ORAL_TABLET | ORAL | 3 refills | Status: DC

## 2020-03-02 MED ORDER — GABAPENTIN 100 MG PO CAPS
ORAL_CAPSULE | ORAL | 3 refills | Status: DC
Start: 2020-03-02 — End: 2020-06-10

## 2020-03-02 NOTE — Progress Notes (Signed)
CENA BRUHN is a 64 y.o. female with the following history as recorded in EpicCare:  Patient Active Problem List   Diagnosis Date Noted  . DKA, type 2 ([place]) 02/16/2020  . Hyperkalemia 02/16/2020  . Hypercalcemia 02/16/2020  . Acute metabolic encephalopathy 19/37/9024  . AKI (acute kidney injury) ([place]) 02/16/2020  . Shingles of eyelid 02/16/2020  . Hypertensive urgency 02/16/2020  . Hidradenitis axillaris 08/31/2016  . Visit for screening mammogram 08/31/2016  . Impingement syndrome of right shoulder 08/12/2016  . Anxiety and depression 11/19/2015  . Hyperlipidemia 09/02/2014  . Uncontrolled diabetes mellitus ([place]) 11/22/2013  . HTN (hypertension) 11/22/2013    Current Outpatient Medications  Medication Sig Dispense Refill  . aspirin EC 81 MG tablet Take 81 mg by mouth daily.    . blood glucose meter kit and supplies KIT Dispense based on patient and insurance preference. Use up to four times daily as directed. (FOR ICD-9 250.00, 250.01). 1 each 0  . carvedilol (COREG) 6.25 MG tablet Take 1 tablet (6.25 mg total) by mouth 2 (two) times daily with a meal. 60 tablet 0  . citalopram (CELEXA) 20 MG tablet Take 1 tablet (20 mg total) by mouth daily. 30 tablet 0  . cyclopentolate (CYCLODRYL,CYCLOGYL) 1 % ophthalmic solution Place 1 drop into the right eye 2 (two) times daily.    Marland Kitchen erythromycin ophthalmic ointment 1 application 3 (three) times daily.    . Insulin Glargine (BASAGLAR KWIKPEN) 100 UNIT/ML Inject 0.35 mLs (35 Units total) into the skin at bedtime. 20 units at night as directed; 15 mL 1  . insulin lispro (HUMALOG) 100 UNIT/ML injection Before each meal 3 times a day, 140-199 - 2 units, 200-250 - 4 units, 251-299 - 8 units,  300-349 - 10 units,  350 or above 14 units. Insulin PEN if approved, provide syringes and needles if needed for 1 month. 10 mL 0  . Insulin Pen Needle 32G X 6 MM MISC 1 Units by Does not apply route daily. Use daily as directed to inject insulin 100 each 3  .  losartan-hydrochlorothiazide (HYZAAR) 100-25 MG tablet TAKE 1 TABLET BY MOUTH EVERY DAY (Patient taking differently: Take 1 tablet by mouth daily. ) 90 tablet 0  . Multiple Vitamins-Minerals (MULTIVITAMIN PO) Take 1 tablet by mouth daily.     . polyethylene glycol (MIRALAX / GLYCOLAX) 17 g packet Take 17 g by mouth daily.    . prednisoLONE acetate (PRED FORTE) 1 % ophthalmic suspension 1 drop 4 (four) times daily.    . SitaGLIPtin-MetFORMIN HCl (JANUMET XR) 50-1000 MG TB24 TAKE 1 TABLET BY MOUTH TWICE A DAY BEFORE LUNCH AND SUPPER 60 tablet 3  . valACYclovir (VALTREX) 500 MG tablet Take 1 tablet (500 mg total) by mouth 2 (two) times daily. 20 tablet 0  . gabapentin (NEURONTIN) 100 MG capsule Take 1 tablet in the am; take 2 tablets in the pm 90 capsule 3   No current facility-administered medications for this visit.    Allergies: Patient has no known allergies.  Past Medical History:  Diagnosis Date  . Diabetes mellitus, type II ([place])   . Frequent headaches   . GERD (gastroesophageal reflux disease)   . Hypertension   . Kidney stones    None current, last had kidney stones in 1986    Past Surgical History:  Procedure Laterality Date  . TONSILLECTOMY AND ADENOIDECTOMY  1985  . TUBAL LIGATION  1985    Family History  Problem Relation Age of Onset  .  Cancer Mother        Lung  . Heart disease Father   . Hypertension Father   . Heart disease Paternal Grandmother        Thyroid  . Hypertension Paternal Grandmother   . Cancer - Other Brother   . Diabetes Maternal Grandfather     Social History   Tobacco Use  . Smoking status: Former Research scientist (life sciences)  . Smokeless tobacco: Never Used  Substance Use Topics  . Alcohol use: Yes    Alcohol/week: 0.0 standard drinks    Comment: occ    Subjective:  Patient was hospitalized 2 weeks ago with with DKA and complications from shingles; has not been seen here since 2019 for follow-up on her Type 2 Diabetes; Has been working with ophthalmologist due  to complications from shingles; notes however, she is having severe pain from the shingles; has not slept well in almost 2 weeks due to the pain; at this point, there does not appear to be any vision loss from the shingles.   Objective:  Vitals:   03/02/20 1330  BP: (!) 160/92  Pulse: 87  Temp: 98.2 F (36.8 C)  TempSrc: Oral  SpO2: 95%  Weight: 182 lb 3.2 oz (82.6 kg)    General: Well developed, well nourished, in no acute distress  Skin : Warm and dry. Scabbed lesions consistent with resolving shingles noted over right forehead Head: Normocephalic and atraumatic  Lungs: Respirations unlabored; clear to auscultation bilaterally without wheeze, rales, rhonchi  CVS exam: normal rate and regular rhythm.  Musculoskeletal: No deformities; no active joint inflammation  Extremities: No edema, cyanosis, clubbing  Vessels: Symmetric bilaterally  Neurologic: Alert and oriented; speech intact; face symmetrical; moves all extremities well; CNII-XII intact without focal deficit   Assessment:  1. Uncontrolled type 2 diabetes mellitus with hyperglycemia ([place])   2. Atypical chest pain   3. Post herpetic neuralgia     Plan:  1. Due to history of uncontrolled diabetes for 5+ years and recent admission with DKA, feel that patient would benefit from endocrine taking her management of her diabetes; she agrees and referral is updated; 2. Update CXR as recommended on recent discharge note; 3.  Start Gabapentin 100 mg- she will start with 200 mg qhs and 100 mg qd; may need to refer to pain management if symptoms do not respond. Follow-up in 2 weeks;   This visit occurred during the SARS-CoV-2 public health emergency.  Safety protocols were in place, including screening questions prior to the visit, additional usage of staff PPE, and extensive cleaning of exam room while observing appropriate contact time as indicated for disinfecting solutions.     Return in about 2 weeks (around 03/16/2020).  Orders  Placed This Encounter  Procedures  . DG Chest 2 View    Order Specific Question:   Reason for Exam (SYMPTOM  OR DIAGNOSIS REQUIRED)    Answer:   atypical chest pain    Order Specific Question:   Preferred imaging location?    Answer:   [facility]    Order Specific Question:   Radiology Contrast Protocol - do NOT remove file path    Answer:   [email or web address]  . CBC with Differential/Platelet  . Comp Met (CMET)  . Ambulatory referral to Endocrinology    Referral Priority:   Routine    Referral Type:   Consultation    Referral Reason:   Specialty Services Required    Number of Visits Requested:   1  Requested Prescriptions   Signed Prescriptions Disp Refills  . SitaGLIPtin-MetFORMIN HCl (JANUMET XR) 50-1000 MG TB24 60 tablet 3    Sig: TAKE 1 TABLET BY MOUTH TWICE A DAY BEFORE LUNCH AND SUPPER  . gabapentin (NEURONTIN) 100 MG capsule 90 capsule 3    Sig: Take 1 tablet in the am; take 2 tablets in the pm

## 2020-03-10 ENCOUNTER — Telehealth: Payer: Self-pay | Admitting: Family

## 2020-03-10 MED ORDER — LOSARTAN POTASSIUM-HCTZ 100-25 MG PO TABS: 1.0000 | ORAL_TABLET | Freq: Every day | ORAL | 1 refills | Status: DC

## 2020-03-10 NOTE — Telephone Encounter (Signed)
Reviewed chart pt is up-to-date sent refills to [facility].../[facility]  

## 2020-03-10 NOTE — Telephone Encounter (Signed)
New message:   1.Medication Requested: losartan-hydrochlorothiazide (HYZAAR) 100-25 MG tablet 2. Pharmacy (Name, Street, Portal): [id number] [place], [address] 3. On Med List: Yes  4. Last Visit with PCP: 03/02/20  5. Next visit date with PCP: 03/16/20   Pt states she called the pharmacy for a refill and they stated it had been denied by provider. Please advise.  Agent: Please be advised that RX refills may take up to 3 business days. We ask that you follow-up with your pharmacy.

## 2020-03-16 ENCOUNTER — Other Ambulatory Visit: Payer: Self-pay

## 2020-03-16 ENCOUNTER — Ambulatory Visit (INDEPENDENT_AMBULATORY_CARE_PROVIDER_SITE_OTHER): Payer: Managed Care, Other (non HMO) | Admitting: Family

## 2020-03-16 ENCOUNTER — Encounter: Payer: Self-pay | Admitting: Family

## 2020-03-16 VITALS — BP 130/74 | HR 101 | Temp 98.1°F | Ht 67.0 in | Wt 180.2 lb

## 2020-03-16 DIAGNOSIS — E2839 Other primary ovarian failure: Secondary | ICD-10-CM

## 2020-03-16 DIAGNOSIS — Z23 Encounter for immunization: Secondary | ICD-10-CM | POA: Diagnosis not present

## 2020-03-16 DIAGNOSIS — B0229 Other postherpetic nervous system involvement: Secondary | ICD-10-CM

## 2020-03-16 DIAGNOSIS — E1165 Type 2 diabetes mellitus with hyperglycemia: Secondary | ICD-10-CM | POA: Diagnosis not present

## 2020-03-16 DIAGNOSIS — I10 Essential (primary) hypertension: Secondary | ICD-10-CM | POA: Diagnosis not present

## 2020-03-16 DIAGNOSIS — Z1231 Encounter for screening mammogram for malignant neoplasm of breast: Secondary | ICD-10-CM

## 2020-03-16 NOTE — Addendum Note (Signed)
Addended by: Marcina Millard on: 03/16/2020 02:20 PM   Modules accepted: Orders

## 2020-03-16 NOTE — Progress Notes (Signed)
Rebecca Cook is a 64 y.o. female with the following history as recorded in EpicCare:  Patient Active Problem List   Diagnosis Date Noted   DKA, type 2 ([place]) 02/16/2020   Hyperkalemia 02/16/2020   Hypercalcemia 71/69/6789   Acute metabolic encephalopathy 38/06/1750   AKI (acute kidney injury) ([place]) 02/16/2020   Shingles of eyelid 02/16/2020   Hypertensive urgency 02/16/2020   Hidradenitis axillaris 08/31/2016   Visit for screening mammogram 08/31/2016   Impingement syndrome of right shoulder 08/12/2016   Anxiety and depression 11/19/2015   Hyperlipidemia 09/02/2014   Uncontrolled diabetes mellitus ([place]) 11/22/2013   HTN (hypertension) 11/22/2013    Current Outpatient Medications  Medication Sig Dispense Refill   aspirin EC 81 MG tablet Take 81 mg by mouth daily.     blood glucose meter kit and supplies KIT Dispense based on patient and insurance preference. Use up to four times daily as directed. (FOR ICD-9 250.00, 250.01). 1 each 0   citalopram (CELEXA) 20 MG tablet Take 1 tablet (20 mg total) by mouth daily. 30 tablet 0   cyclopentolate (CYCLODRYL,CYCLOGYL) 1 % ophthalmic solution Place 1 drop into the right eye 2 (two) times daily.     erythromycin ophthalmic ointment 1 application 3 (three) times daily.     gabapentin (NEURONTIN) 100 MG capsule Take 1 tablet in the am; take 2 tablets in the pm 90 capsule 3   Insulin Glargine (BASAGLAR KWIKPEN) 100 UNIT/ML Inject 0.35 mLs (35 Units total) into the skin at bedtime. 20 units at night as directed; 15 mL 1   insulin lispro (HUMALOG) 100 UNIT/ML injection Before each meal 3 times a day, 140-199 - 2 units, 200-250 - 4 units, 251-299 - 8 units,  300-349 - 10 units,  350 or above 14 units. Insulin PEN if approved, provide syringes and needles if needed for 1 month. 10 mL 0   Insulin Pen Needle 32G X 6 MM MISC 1 Units by Does not apply route daily. Use daily as directed to inject insulin 100 each 3    losartan-hydrochlorothiazide (HYZAAR) 100-25 MG tablet Take 1 tablet by mouth daily. 90 tablet 1   Multiple Vitamins-Minerals (MULTIVITAMIN PO) Take 1 tablet by mouth daily.      polyethylene glycol (MIRALAX / GLYCOLAX) 17 g packet Take 17 g by mouth daily.     prednisoLONE acetate (PRED FORTE) 1 % ophthalmic suspension 1 drop 4 (four) times daily.     SitaGLIPtin-MetFORMIN HCl (JANUMET XR) 50-1000 MG TB24 TAKE 1 TABLET BY MOUTH TWICE A DAY BEFORE LUNCH AND SUPPER 60 tablet 3   valACYclovir (VALTREX) 500 MG tablet Take 1 tablet (500 mg total) by mouth 2 (two) times daily. 20 tablet 0   No current facility-administered medications for this visit.    Allergies: Patient has no known allergies.  Past Medical History:  Diagnosis Date   Diabetes mellitus, type II ([place])    Frequent headaches    GERD (gastroesophageal reflux disease)    Hypertension    Kidney stones    None current, last had kidney stones in 1986    Past Surgical History:  Procedure Laterality Date   TONSILLECTOMY AND [place]    Family History  Problem Relation Age of Onset   Cancer Mother        Lung   Heart disease Father    Hypertension Father    Heart disease Paternal Grandmother        Thyroid  Hypertension Paternal Grandmother    Cancer - Other Brother    Diabetes Maternal Grandfather     Social History   Tobacco Use   Smoking status: Former Smoker   Smokeless tobacco: Never Used  Substance Use Topics   Alcohol use: Yes    Alcohol/week: 0.0 standard drinks    Comment: occ    Subjective:  2 week follow-up on post-herpetic neuralgia; at last OV, patient was started on Gabapentin; notes she is very pleased with her response to the Gabapentin; is able to sleep and is comfortable with current dosage of medication.   Notes that blood sugar this am ( fasting) 147; is scheduled to see/ establish with endocrine in July;   Overdue on her preventive  healthcare needs as well- requesting updated information for GYN; wants mammogram done at [facility] as opposed to [place];    Objective:  Vitals:   03/16/20 1302  BP: 130/74  Pulse: (!) 101  Temp: 98.1 F (36.7 C)  TempSrc: Oral  SpO2: 97%  Weight: 180 lb 3.2 oz (81.7 kg)  Height: '5\' 7"'$  (1.702 m)    General: Well developed, well nourished, in no acute distress  Skin : Warm and dry.  Head: Normocephalic and atraumatic  Lungs: Respirations unlabored;  CVS exam: normal rate and regular rhythm.  Musculoskeletal: No deformities; no active joint inflammation  Extremities: No edema, cyanosis, clubbing  Vessels: Symmetric bilaterally  Neurologic: Alert and oriented; speech intact; face symmetrical; moves all extremities well; CNII-XII intact without focal deficit  Assessment:  1. Post herpetic neuralgia   2. Screening mammogram, encounter for   3. Ovarian failure   4. Uncontrolled type 2 diabetes mellitus with hyperglycemia ([place])   5. Essential hypertension     Plan:  1. Good response to Gabapentin; continue current dosage; plan to follow-up in about 6 months; 2. Referral up dated; 3. Update DEXA; 4. Keep planned follow up appointment to establish care with endocrinologist; 5. Stable; continue same medications;  Tdap and Prevnar given today; follow-up with GYN- contact information given;   No follow-ups on file.  Orders Placed This Encounter  Procedures   MM Digital Screening    [facility]    Standing Status:   Future    Standing Expiration Date:   03/16/2021    Order Specific Question:   Reason for Exam (SYMPTOM  OR DIAGNOSIS REQUIRED)    Answer:   screening mammogram    Order Specific Question:   Preferred imaging location?    Answer:   External   DG Bone Density    Standing Status:   Future    Standing Expiration Date:   03/16/2021    Scheduling Instructions:     [facility]/ please schedule at same time of upcoming mammogram    Order Specific Question:   Reason for Exam  (SYMPTOM  OR DIAGNOSIS REQUIRED)    Answer:   ovarian failure    Order Specific Question:   Preferred imaging location?    Answer:   External    Requested Prescriptions    No prescriptions requested or ordered in this encounter

## 2020-03-16 NOTE — Patient Instructions (Signed)
[facility] [phone]  [employer] [phone]  [facility] [phone]

## 2020-04-08 LAB — HM MAMMOGRAPHY

## 2020-04-15 ENCOUNTER — Encounter: Payer: Self-pay | Admitting: Family

## 2020-04-15 NOTE — Progress Notes (Signed)
Outside notes received. Information abstracted. Notes sent to scan.  

## 2020-04-17 ENCOUNTER — Telehealth: Payer: Self-pay | Admitting: Family

## 2020-04-17 ENCOUNTER — Ambulatory Visit: Payer: Managed Care, Other (non HMO) | Admitting: Endocrinology

## 2020-04-17 NOTE — Telephone Encounter (Signed)
    Patient requesting prescription for insulin lispro (HUMALOG) 100 UNIT/ML injection be called to pharmacy. Patient can not see Dr Loanne Drilling until 8/30. Patient has no medication remaining [id number] [place], [place]

## 2020-04-21 ENCOUNTER — Other Ambulatory Visit: Payer: Self-pay | Admitting: Family

## 2020-04-21 MED ORDER — INSULIN LISPRO 100 UNIT/ML ~~LOC~~ SOLN: SUBCUTANEOUS | 0 refills | Status: DC

## 2020-05-11 ENCOUNTER — Other Ambulatory Visit: Payer: Self-pay | Admitting: Family

## 2020-05-12 ENCOUNTER — Other Ambulatory Visit: Payer: Self-pay

## 2020-05-12 ENCOUNTER — Encounter: Payer: Self-pay | Admitting: Endocrinology

## 2020-05-12 ENCOUNTER — Ambulatory Visit (INDEPENDENT_AMBULATORY_CARE_PROVIDER_SITE_OTHER): Payer: Managed Care, Other (non HMO) | Admitting: Endocrinology

## 2020-05-12 VITALS — BP 130/78 | HR 104 | Ht 67.0 in | Wt 187.4 lb

## 2020-05-12 DIAGNOSIS — E1165 Type 2 diabetes mellitus with hyperglycemia: Secondary | ICD-10-CM | POA: Diagnosis not present

## 2020-05-12 LAB — POCT GLYCOSYLATED HEMOGLOBIN (HGB A1C): Hemoglobin A1C: 9.2 % — AB (ref 4.0–5.6)

## 2020-05-12 MED ORDER — BASAGLAR KWIKPEN 100 UNIT/ML ~~LOC~~ SOPN: 35.0000 [IU] | PEN_INJECTOR | Freq: Every day | SUBCUTANEOUS | 1 refills | Status: DC

## 2020-05-12 MED ORDER — INSULIN LISPRO 100 UNIT/ML ~~LOC~~ SOLN: 6.0000 [IU] | Freq: Three times a day (TID) | SUBCUTANEOUS | 0 refills | Status: DC

## 2020-05-12 NOTE — Progress Notes (Signed)
Subjective:    Patient ID: Rebecca Cook, female    DOB: [birth year], 64 y.o.   MRN: [id number]  HPI pt is referred by Jodi Mourning, NP, for diabetes.  Pt states DM was dx'ed in 2015 (she had GDM in 1982 and 1984). It is complicated by PN; she has been on insulin since 2021; pt says his diet is excellent but exercise is poor; she has never had pancreatitis, pancreatic surgery, severe hypoglycemia.  She had DKA once, in 2021.  Main symptom is fatigue.  She takes basaglar 35/d, and humalog approx 4 units 3 times a day (just before each meal).  She says cbg's are in the 200's.   Past Medical History:  Diagnosis Date  . Diabetes mellitus, type II ([place])   . Frequent headaches   . GERD (gastroesophageal reflux disease)   . Hypertension   . Kidney stones    None current, last had kidney stones in 1986    Past Surgical History:  Procedure Laterality Date  . TONSILLECTOMY AND ADENOIDECTOMY  1985  . TUBAL LIGATION  1985    Social History   Socioeconomic History  . Marital status: Divorced    Spouse name: Not on file  . Number of children: 2  . Years of education: College  . Highest education level: Not on file  Occupational History    Employer: [employer]  Tobacco Use  . Smoking status: Former Research scientist (life sciences)  . Smokeless tobacco: Never Used  Substance and Sexual Activity  . Alcohol use: Yes    Alcohol/week: 0.0 standard drinks    Comment: occ  . Drug use: No  . Sexual activity: Not Currently  Other Topics Concern  . Not on file  Social History Narrative  . Not on file   Social Determinants of Health   Financial Resource Strain:   . Difficulty of Paying Living Expenses: Not on file  Food Insecurity:   . Worried About Charity fundraiser in the Last Year: Not on file  . Ran Out of Food in the Last Year: Not on file  Transportation Needs:   . Lack of Transportation (Medical): Not on file  . Lack of Transportation (Non-Medical): Not on file  Physical Activity:   . Days of  Exercise per Week: Not on file  . Minutes of Exercise per Session: Not on file  Stress:   . Feeling of Stress : Not on file  Social Connections:   . Frequency of Communication with Friends and Family: Not on file  . Frequency of Social Gatherings with Friends and Family: Not on file  . Attends Religious Services: Not on file  . Active Member of Clubs or Organizations: Not on file  . Attends Archivist Meetings: Not on file  . Marital Status: Not on file  Intimate Partner Violence:   . Fear of Current or Ex-Partner: Not on file  . Emotionally Abused: Not on file  . Physically Abused: Not on file  . Sexually Abused: Not on file    Current Outpatient Medications on File Prior to Visit  Medication Sig Dispense Refill  . Blood Glucose Monitoring Suppl (RELION TRUE MET AIR GLUC METER) w/Device KIT 1 each by Does not apply route 3 (three) times daily. E11.9    . gabapentin (NEURONTIN) 100 MG capsule Take 1 tablet in the am; take 2 tablets in the pm 90 capsule 3  . glucose blood (RELION TRUE METRIX TEST STRIPS) test strip 1 each by Other  route 3 (three) times daily. E11.9    . Arata Tea, Camellia sinensis, (Zetina TEA PO) Take 1 capsule by mouth daily.    . Insulin Pen Needle 32G X 6 MM MISC 1 Units by Does not apply route daily. Use daily as directed to inject insulin (Patient taking differently: 1 each by Does not apply route daily. E11.9) 100 each 3  . Insulin Syringe-Needle U-100 (INSULIN SYRINGE 1CC/30GX1/2") 30G X 1/2" 1 ML MISC 1 each by Does not apply route 3 (three) times daily. E11.9    . loratadine (CLARITIN) 10 MG tablet Take 10 mg by mouth daily.    Marland Kitchen losartan-hydrochlorothiazide (HYZAAR) 100-25 MG tablet Take 1 tablet by mouth daily. 90 tablet 1  . Multiple Vitamins-Minerals (MULTIVITAMIN PO) Take 1 tablet by mouth daily.     Tana Conch Lancet Devices 30G MISC 1 each by Does not apply route 3 (three) times daily. E11.9    . SitaGLIPtin-MetFORMIN HCl (JANUMET XR) 50-1000 MG  TB24 TAKE 1 TABLET BY MOUTH TWICE A DAY BEFORE LUNCH AND SUPPER 60 tablet 3  . UNABLE TO FIND Collagen Peptide - 1 scoop qd     No current facility-administered medications on file prior to visit.    No Known Allergies  Family History  Problem Relation Age of Onset  . Cancer Mother        Lung  . Heart disease Father   . Hypertension Father   . Heart disease Paternal Grandmother        Thyroid  . Hypertension Paternal Grandmother   . Cancer - Other Brother   . Diabetes Maternal Grandfather     BP 130/78   Pulse (!) 104   Ht 5\' 7"  (1.702 m)   Wt 187 lb 6.4 oz (85 kg)   SpO2 97%   BMI 29.35 kg/m   Review of Systems denies sob, n/v, hypoglycemia, and urinary frequency.  No change in chronic depression. She has regained a few lbs. She has difficulty with concentration.        Objective:   Physical Exam VITAL SIGNS:  See vs page GENERAL: no distress Pulses: dorsalis pedis intact bilat.   MSK: no deformity of the feet CV: no leg edema Skin:  no ulcer on the feet.  normal color and temp on the feet.   Neuro: sensation is intact to touch on the feet.     Lab Results  Component Value Date   HGBA1C 9.2 (A) 05/12/2020   I have reviewed outside records, and summarized: Pt was noted to have elevated A1c, and referred here.  HTN, wellness, and PHN were also addressed      Assessment & Plan:  Type 1 DM, with PN: uncontrolled.    Patient Instructions  good diet and exercise significantly improve the control of your diabetes.  please let me know if you wish to be referred to a dietician.  high blood sugar is very risky to your health.  you should see an eye doctor and dentist every year.  It is very important to get all recommended vaccinations.  Controlling your blood pressure and cholesterol drastically reduces the damage diabetes does to your body.  Those who smoke should quit.  Please discuss these with your doctor.  check your blood sugar twice a day.  vary the time of  day when you check, between before the 3 meals, and at bedtime.  also check if you have symptoms of your blood sugar being too high or too low.  please keep a record of the readings and bring it to your next appointment here (or you can bring the meter itself).  You can write it on any piece of paper.  please call us sooner if your blood sugar goes below 70, or if you have a lot of readings over 200.   Please take the insulin numbers listed below.   Please see a CDE, to consider a pump and continuous glucose monitor.   Please come back for a follow-up appointment in 6 weeks.

## 2020-05-12 NOTE — Patient Instructions (Signed)
good diet and exercise significantly improve the control of your diabetes.  please let me know if you wish to be referred to a dietician.  high blood sugar is very risky to your health.  you should see an eye doctor and dentist every year.  It is very important to get all recommended vaccinations.  Controlling your blood pressure and cholesterol drastically reduces the damage diabetes does to your body.  Those who smoke should quit.  Please discuss these with your doctor.  check your blood sugar twice a day.  vary the time of day when you check, between before the 3 meals, and at bedtime.  also check if you have symptoms of your blood sugar being too high or too low.  please keep a record of the readings and bring it to your next appointment here (or you can bring the meter itself).  You can write it on any piece of paper.  please call us sooner if your blood sugar goes below 70, or if you have a lot of readings over 200.   Please take the insulin numbers listed below.   Please see a CDE, to consider a pump and continuous glucose monitor.   Please come back for a follow-up appointment in 6 weeks.

## 2020-05-25 ENCOUNTER — Ambulatory Visit: Payer: Managed Care, Other (non HMO) | Admitting: Endocrinology

## 2020-05-27 ENCOUNTER — Encounter: Payer: Managed Care, Other (non HMO) | Attending: Endocrinology | Admitting: Nutrition

## 2020-05-27 ENCOUNTER — Telehealth: Payer: Self-pay | Admitting: Nutrition

## 2020-05-27 ENCOUNTER — Other Ambulatory Visit: Payer: Self-pay

## 2020-05-27 DIAGNOSIS — E1165 Type 2 diabetes mellitus with hyperglycemia: Secondary | ICD-10-CM

## 2020-05-27 DIAGNOSIS — E0865 Diabetes mellitus due to underlying condition with hyperglycemia: Secondary | ICD-10-CM | POA: Diagnosis present

## 2020-05-27 MED ORDER — DEXCOM G6 RECEIVER DEVI: 1.0000 | 0 refills | Status: DC

## 2020-05-27 MED ORDER — DEXCOM G6 SENSOR MISC: 1.0000 | 11 refills | Status: DC

## 2020-05-27 MED ORDER — DEXCOM G6 TRANSMITTER MISC: 1.0000 | 3 refills | Status: DC

## 2020-05-27 NOTE — Progress Notes (Signed)
We discussed insulin pump therapy and CGM therapy.  She does not want to go on a pump at this time, but would love to start CGM.  She prefers the Dexcom sensor and was given a sample to try until the prescription is verified.  Lot #: [id number]   Exp. 11/21 Sensor was insertted on her left upper abdomen without difficulty and a transmitter was linked to her phone and sensor was started.  Her phone would not allow her to share with [place], so I have put her into the [place] computer and an email was sent to link her.   We discussed the difference between sensor glucose and blood glucose and she reported good understanding of this.  She had no final questions Questions were answered on diet and we discussed the importance of balancing meals and testing blood sugar to see if insulin given for meals, was sufficient to bring down blood sugar readings.

## 2020-05-27 NOTE — Telephone Encounter (Signed)
Patient was trained on,and given a sample of the Dexcom sensor per Dr. Cordelia Pen orders.  Please send script to [facility] for this

## 2020-05-27 NOTE — Telephone Encounter (Signed)
E-Prescribing Status: Receipt confirmed by pharmacy (05/27/2020 11:20 AM EDT)

## 2020-05-27 NOTE — Patient Instructions (Signed)
Read over directions on how to use and read the Dexcom sensor Change sensor every 10 days, Change transmitter every 3 months. Call Dexcom help line if questions

## 2020-05-31 ENCOUNTER — Other Ambulatory Visit: Payer: Self-pay | Admitting: Family

## 2020-06-08 ENCOUNTER — Encounter: Payer: Self-pay | Admitting: Endocrinology

## 2020-06-09 ENCOUNTER — Encounter: Payer: Self-pay | Admitting: Family

## 2020-06-09 ENCOUNTER — Encounter: Payer: Self-pay | Admitting: Endocrinology

## 2020-06-10 ENCOUNTER — Other Ambulatory Visit: Payer: Self-pay

## 2020-06-10 ENCOUNTER — Other Ambulatory Visit: Payer: Self-pay | Admitting: Family

## 2020-06-10 DIAGNOSIS — E1165 Type 2 diabetes mellitus with hyperglycemia: Secondary | ICD-10-CM

## 2020-06-10 MED ORDER — DEXCOM G6 RECEIVER DEVI: 1.0000 | 0 refills | Status: DC

## 2020-06-10 MED ORDER — GABAPENTIN 300 MG PO CAPS: 300.0000 mg | ORAL_CAPSULE | Freq: Three times a day (TID) | ORAL | 1 refills | Status: DC

## 2020-06-10 MED ORDER — DEXCOM G6 TRANSMITTER MISC: 1.0000 | 3 refills | Status: DC

## 2020-06-10 MED ORDER — DEXCOM G6 SENSOR MISC: 1.0000 | 11 refills | Status: DC

## 2020-06-18 DIAGNOSIS — D259 Leiomyoma of uterus, unspecified: Secondary | ICD-10-CM | POA: Insufficient documentation

## 2020-06-27 ENCOUNTER — Encounter: Payer: Self-pay | Admitting: Endocrinology

## 2020-06-29 MED ORDER — INSULIN LISPRO 100 UNIT/ML ~~LOC~~ SOLN: SUBCUTANEOUS | 1 refills | Status: DC

## 2020-06-29 MED ORDER — BASAGLAR KWIKPEN 100 UNIT/ML ~~LOC~~ SOPN
35.0000 [IU] | PEN_INJECTOR | Freq: Every day | SUBCUTANEOUS | 1 refills | Status: DC
Start: 2020-06-29 — End: 2020-07-01

## 2020-07-01 ENCOUNTER — Encounter: Payer: Self-pay | Admitting: Endocrinology

## 2020-07-01 ENCOUNTER — Other Ambulatory Visit: Payer: Self-pay

## 2020-07-01 ENCOUNTER — Ambulatory Visit (INDEPENDENT_AMBULATORY_CARE_PROVIDER_SITE_OTHER): Payer: Managed Care, Other (non HMO) | Admitting: Endocrinology

## 2020-07-01 VITALS — BP 142/88 | HR 91 | Ht 67.0 in | Wt 196.0 lb

## 2020-07-01 DIAGNOSIS — E1165 Type 2 diabetes mellitus with hyperglycemia: Secondary | ICD-10-CM | POA: Diagnosis not present

## 2020-07-01 LAB — POCT GLYCOSYLATED HEMOGLOBIN (HGB A1C): Hemoglobin A1C: 7.4 % — AB (ref 4.0–5.6)

## 2020-07-01 MED ORDER — BASAGLAR KWIKPEN 100 UNIT/ML ~~LOC~~ SOPN
32.0000 [IU] | PEN_INJECTOR | Freq: Every day | SUBCUTANEOUS | 3 refills | Status: DC
Start: 2020-07-01 — End: 2021-01-06

## 2020-07-01 MED ORDER — INSULIN LISPRO 100 UNIT/ML ~~LOC~~ SOLN
11.0000 [IU] | Freq: Three times a day (TID) | SUBCUTANEOUS | 1 refills | Status: DC
Start: 2020-07-01 — End: 2020-12-23

## 2020-07-01 NOTE — Progress Notes (Signed)
Subjective:    Patient ID: Rebecca Cook, female    DOB: [birth year], 64 y.o.   MRN: [id number]  HPI Pt returns for f/u of diabetes mellitus: DM type: 1 Dx'ed: 1443 Complications: PN Therapy: insulin since 2021 GDM: 1982 and 1984  DKA: once (2021) Severe hypoglycemia: never Pancreatitis: never Pancreatic imaging: never SDOH: none Other: she takes multiple daily injections; she declines pump Interval history: Main symptom is pain/numbness of both legs and feet.  I reviewed continuous glucose monitor data.  Glucose varies from 68-270.  It is in general higher at HS than fasting, but there is little trend throughout the day.  She takes Basaglar, 34 units qhs, and humalog 10-12 units 3 times a day (just before each meal).   Past Medical History:  Diagnosis Date  . Diabetes mellitus, type II ([place])   . Frequent headaches   . GERD (gastroesophageal reflux disease)   . Hypertension   . Kidney stones    None current, last had kidney stones in 1986    Past Surgical History:  Procedure Laterality Date  . TONSILLECTOMY AND ADENOIDECTOMY  1985  . TUBAL LIGATION  1985    Social History   Socioeconomic History  . Marital status: Divorced    Spouse name: Not on file  . Number of children: 2  . Years of education: College  . Highest education level: Not on file  Occupational History    Employer: [employer]  Tobacco Use  . Smoking status: Former Research scientist (life sciences)  . Smokeless tobacco: Never Used  Substance and Sexual Activity  . Alcohol use: Yes    Alcohol/week: 0.0 standard drinks    Comment: occ  . Drug use: No  . Sexual activity: Not Currently  Other Topics Concern  . Not on file  Social History Narrative  . Not on file   Social Determinants of Health   Financial Resource Strain:   . Difficulty of Paying Living Expenses: Not on file  Food Insecurity:   . Worried About Charity fundraiser in the Last Year: Not on file  . Ran Out of Food in the Last Year: Not on file   Transportation Needs:   . Lack of Transportation (Medical): Not on file  . Lack of Transportation (Non-Medical): Not on file  Physical Activity:   . Days of Exercise per Week: Not on file  . Minutes of Exercise per Session: Not on file  Stress:   . Feeling of Stress : Not on file  Social Connections:   . Frequency of Communication with Friends and Family: Not on file  . Frequency of Social Gatherings with Friends and Family: Not on file  . Attends Religious Services: Not on file  . Active Member of Clubs or Organizations: Not on file  . Attends Archivist Meetings: Not on file  . Marital Status: Not on file  Intimate Partner Violence:   . Fear of Current or Ex-Partner: Not on file  . Emotionally Abused: Not on file  . Physically Abused: Not on file  . Sexually Abused: Not on file    Current Outpatient Medications on File Prior to Visit  Medication Sig Dispense Refill  . Blood Glucose Monitoring Suppl (RELION TRUE MET AIR GLUC METER) w/Device KIT 1 each by Does not apply route 3 (three) times daily. E11.9    . Continuous Blood Gluc Receiver ([place]) [place] 1 each by Does not apply route See admin instructions. E11.10 1 each 0  .  Continuous Blood Gluc Sensor (DEXCOM G6 SENSOR) MISC 1 each by Does not apply route See admin instructions. Change sensor every 10 days; E11.10 3 each 11  . Continuous Blood Gluc Transmit (DEXCOM G6 TRANSMITTER) MISC 1 each by Does not apply route every 3 (three) months. E11.10 1 each 3  . gabapentin (NEURONTIN) 300 MG capsule Take 1 capsule (300 mg total) by mouth 3 (three) times daily. 90 capsule 1  . glucose blood (RELION TRUE METRIX TEST STRIPS) test strip 1 each by Other route 3 (three) times daily. E11.9    . [place]enson Tea, Camellia sinensis, ([place] TEA PO) Take 1 capsule by mouth daily.    . Insulin Pen Needle 32G X 6 MM MISC 1 Units by Does not apply route daily. Use daily as directed to inject insulin (Patient taking differently: 1  each by Does not apply route daily. E11.9) 100 each 3  . Insulin Syringe-Needle U-100 (INSULIN SYRINGE 1CC/30GX1/2") 30G X 1/2" 1 ML MISC 1 each by Does not apply route 3 (three) times daily. E11.9    . loratadine (CLARITIN) 10 MG tablet Take 10 mg by mouth daily.    Marland Kitchen losartan-hydrochlorothiazide (HYZAAR) 100-25 MG tablet Take 1 tablet by mouth daily. 90 tablet 1  . Multiple Vitamins-Minerals (MULTIVITAMIN PO) Take 1 tablet by mouth daily.     Tana Conch Lancet Devices 30G MISC 1 each by Does not apply route 3 (three) times daily. E11.9    . SitaGLIPtin-MetFORMIN HCl (JANUMET XR) 50-1000 MG TB24 TAKE 1 TABLET BY MOUTH TWICE A DAY BEFORE LUNCH AND SUPPER 60 tablet 3  . UNABLE TO FIND Collagen Peptide - 1 scoop qd     No current facility-administered medications on file prior to visit.    No Known Allergies  Family History  Problem Relation Age of Onset  . Cancer Mother        Lung  . Heart disease Father   . Hypertension Father   . Heart disease Paternal Grandmother        Thyroid  . Hypertension Paternal Grandmother   . Cancer - Other Brother   . Diabetes Maternal Grandfather     BP (!) 142/88   Pulse 91   Ht 5\' 7"  (1.702 m)   Wt 196 lb (88.9 kg)   SpO2 98%   BMI 30.70 kg/m    Review of Systems     Objective:   Physical Exam VITAL SIGNS:  See vs page GENERAL: no distress Pulses: dorsalis pedis intact bilat.   MSK: no deformity of the feet CV: no leg edema Skin:  no ulcer on the feet.  normal color and temp on the feet.   Neuro: sensation is intact to touch on the feet.     Lab Results  Component Value Date   HGBA1C 7.4 (A) 07/01/2020       Assessment & Plan:  Type 1 DM: Based on the pattern of her cbg's, she needs some adjustment in her therapy.  Hypoglycemia, due to insulin: this limits aggressiveness of glycemic control.    Patient Instructions  check your blood sugar twice a day.  vary the time of day when you check, between before the 3 meals, and at  bedtime.  also check if you have symptoms of your blood sugar being too high or too low.  please keep a record of the readings and bring it to your next appointment here (or you can bring the meter itself).  You can write it on  any piece of paper.  please call us sooner if your blood sugar goes below 70, or if you have a lot of readings over 200.   Please take the insulin numbers listed below.     Please come back for a follow-up appointment in January.

## 2020-07-01 NOTE — Patient Instructions (Addendum)
check your blood sugar twice a day.  vary the time of day when you check, between before the 3 meals, and at bedtime.  also check if you have symptoms of your blood sugar being too high or too low.  please keep a record of the readings and bring it to your next appointment here (or you can bring the meter itself).  You can write it on any piece of paper.  please call us sooner if your blood sugar goes below 70, or if you have a lot of readings over 200.   Please take the insulin numbers listed below.     Please come back for a follow-up appointment in January.

## 2020-07-06 ENCOUNTER — Other Ambulatory Visit: Payer: Self-pay | Admitting: Radiology

## 2020-07-15 ENCOUNTER — Encounter: Payer: Self-pay | Admitting: Family

## 2020-07-16 ENCOUNTER — Other Ambulatory Visit: Payer: Self-pay | Admitting: Family

## 2020-07-16 DIAGNOSIS — F32A Depression, unspecified: Secondary | ICD-10-CM

## 2020-07-24 ENCOUNTER — Other Ambulatory Visit: Payer: Self-pay | Admitting: Endocrinology

## 2020-07-24 DIAGNOSIS — E0865 Diabetes mellitus due to underlying condition with hyperglycemia: Secondary | ICD-10-CM

## 2020-07-28 ENCOUNTER — Ambulatory Visit: Payer: Self-pay | Admitting: Dietician

## 2020-08-14 ENCOUNTER — Other Ambulatory Visit: Payer: Self-pay | Admitting: Family

## 2020-08-14 MED ORDER — GABAPENTIN 300 MG PO CAPS: 300.0000 mg | ORAL_CAPSULE | Freq: Three times a day (TID) | ORAL | 1 refills | Status: DC

## 2020-08-17 ENCOUNTER — Other Ambulatory Visit: Payer: Self-pay

## 2020-08-17 ENCOUNTER — Other Ambulatory Visit: Payer: Self-pay | Admitting: Family

## 2020-08-17 ENCOUNTER — Ambulatory Visit: Payer: Managed Care, Other (non HMO) | Admitting: Family

## 2020-08-17 ENCOUNTER — Ambulatory Visit (INDEPENDENT_AMBULATORY_CARE_PROVIDER_SITE_OTHER): Payer: Managed Care, Other (non HMO)

## 2020-08-17 ENCOUNTER — Encounter: Payer: Self-pay | Admitting: Family

## 2020-08-17 VITALS — BP 128/82 | HR 89 | Temp 98.1°F | Ht 67.0 in | Wt 194.9 lb

## 2020-08-17 DIAGNOSIS — M25572 Pain in left ankle and joints of left foot: Secondary | ICD-10-CM | POA: Diagnosis not present

## 2020-08-17 DIAGNOSIS — S0181XD Laceration without foreign body of other part of head, subsequent encounter: Secondary | ICD-10-CM | POA: Diagnosis not present

## 2020-08-17 DIAGNOSIS — F4321 Adjustment disorder with depressed mood: Secondary | ICD-10-CM | POA: Diagnosis not present

## 2020-08-17 DIAGNOSIS — M25561 Pain in right knee: Secondary | ICD-10-CM | POA: Diagnosis not present

## 2020-08-17 DIAGNOSIS — S82892A Other fracture of left lower leg, initial encounter for closed fracture: Secondary | ICD-10-CM

## 2020-08-17 MED ORDER — MUPIROCIN 2 % EX OINT: 1.0000 "application " | TOPICAL_OINTMENT | Freq: Two times a day (BID) | CUTANEOUS | 0 refills | Status: DC

## 2020-08-17 NOTE — Progress Notes (Signed)
Rebecca Cook is a 64 y.o. female with the following history as recorded in Epic[facility]:  Patient Active Problem List   Diagnosis Date Noted  . DKA, type 2 ([place]) 02/16/2020  . Hyperkalemia 02/16/2020  . Hypercalcemia 02/16/2020  . Acute metabolic encephalopathy 23/76/2831  . AKI (acute kidney injury) ([place]) 02/16/2020  . Shingles of eyelid 02/16/2020  . Hypertensive urgency 02/16/2020  . Hidradenitis axillaris 08/31/2016  . Visit for screening mammogram 08/31/2016  . Impingement syndrome of right shoulder 08/12/2016  . Anxiety and depression 11/19/2015  . Hyperlipidemia 09/02/2014  . Uncontrolled diabetes mellitus ([place]) 11/22/2013  . HTN (hypertension) 11/22/2013    Current Outpatient Medications  Medication Sig Dispense Refill  . Blood Glucose Monitoring Suppl (RELION TRUE MET AIR GLUC METER) w/Device KIT 1 each by Does not apply route 3 (three) times daily. E11.9    . cephALEXin (KEFLEX) 500 MG capsule Take by mouth.    . Continuous Blood Gluc Receiver ([place]) [place] 1 each by Does not apply route See admin instructions. E11.10 1 each 0  . Continuous Blood Gluc Sensor (DEXCOM G6 SENSOR) MISC 1 each by Does not apply route See admin instructions. Change sensor every 10 days; E11.10 3 each 11  . Continuous Blood Gluc Transmit (DEXCOM G6 TRANSMITTER) MISC 1 each by Does not apply route every 3 (three) months. E11.10 1 each 3  . gabapentin (NEURONTIN) 300 MG capsule Take 1 capsule (300 mg total) by mouth 3 (three) times daily. 270 capsule 1  . glucose blood (RELION TRUE METRIX TEST STRIPS) test strip 1 each by Other route 3 (three) times daily. E11.9    . [place] Tea, Camellia sinensis, ([place] TEA PO) Take 1 capsule by mouth daily.    Marland Kitchen ibuprofen (ADVIL) 600 MG tablet Take 600 mg by mouth 3 (three) times daily.    . Insulin Glargine (BASAGLAR KWIKPEN) 100 UNIT/ML Inject 32 Units into the skin at bedtime. 45 mL 3  . insulin lispro (HUMALOG) 100 UNIT/ML injection Inject 0.11-0.13  mLs (11-13 Units total) into the skin 3 (three) times daily with meals. 30 mL 1  . Insulin Pen Needle 32G X 6 MM MISC 1 Units by Does not apply route daily. Use daily as directed to inject insulin (Patient taking differently: 1 each by Does not apply route daily. E11.9) 100 each 3  . Insulin Syringe-Needle U-100 (INSULIN SYRINGE 1CC/30GX1/2") 30G X 1/2" 1 ML MISC 1 each by Does not apply route 3 (three) times daily. E11.9    . loratadine (CLARITIN) 10 MG tablet Take 10 mg by mouth daily.    Marland Kitchen losartan-hydrochlorothiazide (HYZAAR) 100-25 MG tablet Take 1 tablet by mouth daily. 90 tablet 1  . Multiple Vitamins-Minerals (MULTIVITAMIN PO) Take 1 tablet by mouth daily.     Daryll Brod Lancet Devices 30G MISC 1 each by Does not apply route 3 (three) times daily. E11.9    . SitaGLIPtin-MetFORMIN HCl (JANUMET XR) 50-1000 MG TB24 TAKE 1 TABLET BY MOUTH TWICE A DAY BEFORE LUNCH AND SUPPER 60 tablet 3  . UNABLE TO FIND Collagen Peptide - 1 scoop qd    . mupirocin ointment (BACTROBAN) 2 % Apply 1 application topically 2 (two) times daily. 22 g 0   No current facility-administered medications for this visit.    Allergies: Patient has no known allergies.  Past Medical History:  Diagnosis Date  . Diabetes mellitus, type II ([place])   . Frequent headaches   . GERD (gastroesophageal reflux disease)   .  Hypertension   . Kidney stones    None current, last had kidney stones in 1986    Past Surgical History:  Procedure Laterality Date  . TONSILLECTOMY AND ADENOIDECTOMY  1985  . TUBAL LIGATION  1985    Family History  Problem Relation Age of Onset  . Cancer Mother        Lung  . Heart disease Father   . Hypertension Father   . Heart disease Paternal Grandmother        Thyroid  . Hypertension Paternal Grandmother   . Cancer - Other Brother   . Diabetes Maternal Grandfather     Social History   Tobacco Use  . Smoking status: Former Research scientist (life sciences)  . Smokeless tobacco: Never Used  Substance Use Topics  .  Alcohol use: Yes    Alcohol/week: 0.0 standard drinks    Comment: occ    Subjective:  Patient fell on Thursday- states that left ankle "rolled" and then landed on her right knee; went to [facility] yesterday and was given Ibuprofen with some benefit; requesting updated X-rays today;     Objective:  Vitals:   08/17/20 1115  BP: 128/82  Pulse: 89  Temp: 98.1 F (36.7 C)  TempSrc: Oral  SpO2: 95%  Weight: 194 lb 14.4 oz (88.4 kg)  Height: _0  (1.702 m)    General: Well developed, well nourished, in no acute distress  Skin : Warm and dry. Laceration noted beneath left nostril above upper lip with surrounding erythema;  Head: Normocephalic and atraumatic  Lungs: Respirations unlabored;  Musculoskeletal: No deformities; swelling noted over left ankle; crepitation noted in right ankle; FROM in R ankle;   Extremities: No edema, cyanosis, clubbing  Vessels: Symmetric bilaterally  Neurologic: Alert and oriented; speech intact; face symmetrical; moves all extremities well; CNII-XII intact without focal deficit   Assessment:  1. Acute left ankle pain   2. Acute pain of right knee   3. Facial laceration, subsequent encounter   4. Situational depression     Plan:  Update X-rays as requested; continue Ibuprofen given at [facility]; rest, elevate and ice the ankle and knee; She is also encouraged to take the keflex given at [facility] for the facial laceration beneath her nose; will add topical Bactroban;  Discussed medication but patient defers; she will start with counselor in January and will continue with EAP provider for now; follow-up if she decides she does want medication;  This visit occurred during the SARS-CoV-2 public health emergency.  Safety protocols were in place, including screening questions prior to the visit, additional usage of staff PPE, and extensive cleaning of exam room while observing appropriate contact time as indicated for disinfecting solutions.     No follow-ups on file.   Orders Placed This Encounter  Procedures  . DG Knee Complete 4 Views Right    Standing Status:   Future    Number of Occurrences:   1    Standing Expiration Date:   08/17/2021    Order Specific Question:   Reason for Exam (SYMPTOM  OR DIAGNOSIS REQUIRED)    Answer:   right knee pain    Order Specific Question:   Preferred imaging location?    Answer:   [facility]  . DG Ankle Complete Left    Standing Status:   Future    Number of Occurrences:   1    Standing Expiration Date:   08/17/2021    Order Specific Question:   Reason for Exam (SYMPTOM  OR DIAGNOSIS REQUIRED)    Answer:   left ankle pain    Order Specific Question:   Preferred imaging location?    Answer:   [facility]    Requested Prescriptions   Signed Prescriptions Disp Refills  . mupirocin ointment (BACTROBAN) 2 % 22 g 0    Sig: Apply 1 application topically 2 (two) times daily.

## 2020-08-19 ENCOUNTER — Other Ambulatory Visit: Payer: Self-pay

## 2020-08-19 ENCOUNTER — Ambulatory Visit (INDEPENDENT_AMBULATORY_CARE_PROVIDER_SITE_OTHER): Payer: Managed Care, Other (non HMO) | Admitting: Podiatry

## 2020-08-19 DIAGNOSIS — S82892A Other fracture of left lower leg, initial encounter for closed fracture: Secondary | ICD-10-CM | POA: Diagnosis not present

## 2020-08-19 DIAGNOSIS — S93402A Sprain of unspecified ligament of left ankle, initial encounter: Secondary | ICD-10-CM | POA: Diagnosis not present

## 2020-08-25 ENCOUNTER — Encounter: Payer: Self-pay | Admitting: Podiatry

## 2020-08-25 NOTE — Progress Notes (Signed)
Subjective:  Patient ID: Rebecca Cook, female    DOB: 11-06-1955,  MRN: [id number]  Chief Complaint  Patient presents with  . Fracture    PT fell last thursday in front of her house and rolled her ankle. Went to her DR on monday and they took Xrays and diagnosed her with a closed fracture she has some brusing and swelling to the left foot    64 y.o. female presents with the above complaint.  Patient presents with complaint of left mild ankle fracture.  Patient states that she fell part of her house and had x-rays taken which showed small avulsion fracture of the distal fibula.  She is here to get it evaluated and diagnosed.  She still has some pain swelling and some bruising associated with it.  She denies any other acute complaints.  She is a type I diabetic with last A1c of 7.4.  She denies any other acute complaints.  She was seen in the urgent care.  She has not seen any foot and ankle specialist.   Review of Systems: Negative except as noted in the HPI. Denies N/V/F/Ch.  Past Medical History:  Diagnosis Date  . Diabetes mellitus, type II ([place])   . Frequent headaches   . GERD (gastroesophageal reflux disease)   . Hypertension   . Kidney stones    None current, last had kidney stones in 1986    Current Outpatient Medications:  .  Blood Glucose Monitoring Suppl (RELION TRUE MET AIR GLUC METER) w/Device KIT, 1 each by Does not apply route 3 (three) times daily. E11.9, Disp: , Rfl:  .  cephALEXin (KEFLEX) 500 MG capsule, Take by mouth., Disp: , Rfl:  .  Continuous Blood Gluc Receiver ([place]) [place], 1 each by Does not apply route See admin instructions. E11.10, Disp: 1 each, Rfl: 0 .  Continuous Blood Gluc Sensor (DEXCOM G6 SENSOR) MISC, 1 each by Does not apply route See admin instructions. Change sensor every 10 days; E11.10, Disp: 3 each, Rfl: 11 .  Continuous Blood Gluc Transmit (DEXCOM G6 TRANSMITTER) MISC, 1 each by Does not apply route every 3 (three) months.  E11.10, Disp: 1 each, Rfl: 3 .  gabapentin (NEURONTIN) 300 MG capsule, Take 1 capsule (300 mg total) by mouth 3 (three) times daily., Disp: 270 capsule, Rfl: 1 .  glucose blood (RELION TRUE METRIX TEST STRIPS) test strip, 1 each by Other route 3 (three) times daily. E11.9, Disp: , Rfl:  .  [place] Tea, Camellia sinensis, ([place] TEA PO), Take 1 capsule by mouth daily., Disp: , Rfl:  .  ibuprofen (ADVIL) 600 MG tablet, Take 600 mg by mouth 3 (three) times daily., Disp: , Rfl:  .  Insulin Glargine (BASAGLAR KWIKPEN) 100 UNIT/ML, Inject 32 Units into the skin at bedtime., Disp: 45 mL, Rfl: 3 .  insulin lispro (HUMALOG) 100 UNIT/ML injection, Inject 0.11-0.13 mLs (11-13 Units total) into the skin 3 (three) times daily with meals., Disp: 30 mL, Rfl: 1 .  Insulin Pen Needle 32G X 6 MM MISC, 1 Units by Does not apply route daily. Use daily as directed to inject insulin (Patient taking differently: 1 each by Does not apply route daily. E11.9), Disp: 100 each, Rfl: 3 .  Insulin Syringe-Needle U-100 (INSULIN SYRINGE 1CC/30GX1/2") 30G X 1/2" 1 ML MISC, 1 each by Does not apply route 3 (three) times daily. E11.9, Disp: , Rfl:  .  loratadine (CLARITIN) 10 MG tablet, Take 10 mg by mouth daily., Disp: ,  Rfl:  .  losartan-hydrochlorothiazide (HYZAAR) 100-25 MG tablet, Take 1 tablet by mouth daily., Disp: 90 tablet, Rfl: 1 .  Multiple Vitamins-Minerals (MULTIVITAMIN PO), Take 1 tablet by mouth daily. , Disp: , Rfl:  .  mupirocin ointment (BACTROBAN) 2 %, Apply 1 application topically 2 (two) times daily., Disp: 22 g, Rfl: 0 .  ReliOn Lancet Devices 30G MISC, 1 each by Does not apply route 3 (three) times daily. E11.9, Disp: , Rfl:  .  SitaGLIPtin-MetFORMIN HCl (JANUMET XR) 50-1000 MG TB24, TAKE 1 TABLET BY MOUTH TWICE A DAY BEFORE LUNCH AND SUPPER, Disp: 60 tablet, Rfl: 3 .  UNABLE TO FIND, Collagen Peptide - 1 scoop qd, Disp: , Rfl:   Social History   Tobacco Use  Smoking Status Former Smoker  Smokeless Tobacco  Never Used    No Known Allergies Objective:  There were no vitals filed for this visit. There is no height or weight on file to calculate BMI. Constitutional Well developed. Well nourished.  Vascular Dorsalis pedis pulses palpable bilaterally. Posterior tibial pulses palpable bilaterally. Capillary refill normal to all digits.  No cyanosis or clubbing noted. Pedal hair growth normal.  Neurologic Normal speech. Oriented to person, place, and time. Epicritic sensation to light touch grossly present bilaterally.  Dermatologic Nails well groomed and normal in appearance. No open wounds. No skin lesions.  Orthopedic:  Pain on palpation to the left distal fibula at the tip of the fibula.  Pain with plantarflexion and inversion of the foot.  No pain with dorsiflexion eversion of the foot.  No deep intra-articular ankle joint pain.  No concern for instability of the syndesmosis.   Radiographs: 3 views of skeletally mature adult left ankle: Small avulsion fracture noted of the distal fibula likely due to an ATFL sprain.  No fractures noted at the main bone fragment of tibia and fibula.No talar dome fractures noted.  No other bony abnormalities noted. Assessment:   1. Severe ankle sprain, left, initial encounter   2. Closed avulsion fracture of left ankle, initial encounter    Plan:  Patient was evaluated and treated and all questions answered.  Left avulsion fracture of the fibula likely due to a severe ankle sprain -I explained to the patient that etiology of avulsion fracture given that patient may have undergone plantarflexion inversion injury and various treatment options were discussed.  Given that patient does not have any acute fracture on the fibula at this time no surgical interventions are warranted.  Patient will benefit from immobilization with a cam boot.  She will be placed in a boot for next 4 weeks.  If there is clinical improvement of pain I will plan to transition to regular  shoes with a Tri-Lock ankle brace.  Patient states understanding. -Cam boot was dispensed  No follow-ups on file.

## 2020-08-26 ENCOUNTER — Other Ambulatory Visit: Payer: Self-pay | Admitting: Family

## 2020-09-01 ENCOUNTER — Encounter: Payer: Self-pay | Admitting: Dietician

## 2020-09-01 ENCOUNTER — Encounter: Payer: Managed Care, Other (non HMO) | Attending: Endocrinology | Admitting: Dietician

## 2020-09-01 ENCOUNTER — Other Ambulatory Visit: Payer: Self-pay

## 2020-09-01 DIAGNOSIS — E0865 Diabetes mellitus due to underlying condition with hyperglycemia: Secondary | ICD-10-CM | POA: Diagnosis present

## 2020-09-01 NOTE — Patient Instructions (Signed)
Depending on your insulin dosages, you may need to adjust your carb choices accordingly (or vice versa.) To begin with, aim to eat consistently throughout your day (3 meals plus snacks in between as needed) and aim for the following ranges:   2-4 carb choices per meal (30-60 grams of carbohydrates)  0-2 carb choices per snack (0-30 grams of carbohydrates) Remember to try and incorporate protein and/or a healthy fat with your meals and snacks as you are able. Utilize the meal and snack ideas handouts from today to give a variety of options.  Continue to do a great job with monitoring your blood sugar. Again, you may need to increase/decrease the amount of carbs you eat per meal depending on how your blood glucose levels look after eating. Your insulin may need adjusting over time as well.

## 2020-09-01 NOTE — Progress Notes (Signed)
Diabetes Self-Management Education  Visit Type: Follow-up  Appt. Start Time: 2:00pm  Appt. End Time: 3:10pm  09/01/2020  [name], identified by name and date of birth, is a 64 y.o. female with a diagnosis of Diabetes.   ASSESSMENT  Patient arrives today by herself. Patient comes as a follow up patient to [facility], has worked with Leonia Reader, RN for CGM education. Patient states she has many questions regarding nutrition and how food is affecting her blood sugar. States she had T2DM for a while then unknowingly had shingles with triggered diabetic ketoacidosis, ultimately shutting down her pancreatic function.   Typical meal pattern is 3 meals per day plus snacks. Patient states she has tried to educate herself on how to eat properly but is still confused. Has cut down her carbohydrate intake drastically. States she may eat some carbs with her meals, such as <1/2 cup of oatmeal at breakfast, or 1 slice of bread with only 9 grams of carbohydrate. Patient states her blood sugar often dips low (~50s) about once per week, so she keeps orange juice on hand to bring it back up. States she has noticed that stress causes her blood sugar to rise as well, especially work related stress. Patient is limited on the amount of physical activity she can do due to her ankle (currenlty in a boot.)     Diabetes Self-Management Education - 09/01/20 1545      Visit Information   Visit Type Follow-up      Psychosocial Assessment   Patient Belief/Attitude about Diabetes Motivated to manage diabetes    Self-care barriers None    Self-management support Doctor's office;Friends    Patient Concerns Nutrition/Meal planning;Glycemic Control;Problem Solving    Special Needs None    Preferred Learning Style No preference indicated    Learning Readiness Ready      Complications   Last HgB A1C per patient/outside source 7.4 % -  07/01/2020 per Epic   How often do you check your blood sugar? > 4 times/day     Fasting Blood glucose range (mg/dL) 70-129    Postprandial Blood glucose range (mg/dL) 130-179    Number of hypoglycemic episodes per month 4    Can you tell when your blood sugar is low? Yes    What do you do if your blood sugar is low? drink juice      Dietary Intake   Breakfast oatmeal + diced apple + walnuts -  or 2 eggs + sausage   Lunch grilled chicken + salad    Snack (afternoon) nuts    Dinner grilled chicken + creamed potatoes + salad + strawberries    Beverage(s) water, water with lemon      Exercise   Exercise Type ADL's      Patient Education   Previous Diabetes Education Yes (please comment) -  September 2021   Disease state  Definition of diabetes, type 1 and 2, and the diagnosis of diabetes;Factors that contribute to the development of diabetes;Explored patient's options for treatment of their diabetes    Nutrition management  Role of diet in the treatment of diabetes and the relationship between the three main macronutrients and blood glucose level;Food label reading, portion sizes and measuring food.;Carbohydrate counting;Reviewed blood glucose goals for pre and post meals and how to evaluate the patients' food intake on their blood glucose level.;Effects of alcohol on blood glucose and safety factors with consumption of alcohol.;Information on hints to eating out and maintain blood glucose control.;Meal options  for control of blood glucose level and chronic complications.;Meal timing in regards to the patients' current diabetes medication.    Physical activity and exercise  Role of exercise on diabetes management, blood pressure control and cardiac health.    Monitoring Taught/evaluated SMBG meter.;Purpose and frequency of SMBG.;Identified appropriate SMBG and/or A1C goals.    Acute complications Taught treatment of hypoglycemia - the 15 rule.;Discussed and identified patients' treatment of hyperglycemia.    Psychosocial adjustment Role of stress on diabetes;Identified and  addressed patients feelings and concerns about diabetes      Individualized Goals (developed by patient)   Nutrition Follow meal plan discussed;General guidelines for healthy choices and portions discussed      Outcomes   Expected Outcomes Demonstrated interest in learning. Expect positive outcomes    Future DMSE PRN      Subsequent Visit   Since your last visit have you continued or begun to take your medications as prescribed? Yes    Since your last visit have you experienced any weight changes? No change    Since your last visit, are you checking your blood glucose at least once a day? Yes           Individualized Plan for Diabetes Self-Management Training:  Learning Objective:  Patient will have a greater understanding of diabetes self-management. Patient education plan is to attend individual and/or group sessions per assessed needs and concerns.  Today we primarily discussed carb counting, essentially how to "measure" the amount of carbohydrates eaten at a time to know how to adjust this and/or insulin as appropriate. Patient states she has a much better understanding of how to plan her meals and snacks and is glad to have meal/snack ideas so that she can incorporate more variety into her diet.    Plan:  Patient Instructions  Depending on your insulin dosages, you may need to adjust your carb choices accordingly (or vice versa.) To begin with, aim to eat consistently throughout your day (3 meals plus snacks in between as needed) and aim for the following ranges:   2-4 carb choices per meal (30-60 grams of carbohydrates)  0-2 carb choices per snack (0-30 grams of carbohydrates) Remember to try and incorporate protein and/or a healthy fat with your meals and snacks as you are able. Utilize the meal and snack ideas handouts from today to give a variety of options.  Continue to do a great job with monitoring your blood sugar. Again, you may need to increase/decrease the amount of  carbs you eat per meal depending on how your blood glucose levels look after eating. Your insulin may need adjusting over time as well.    Expected Outcomes:  Demonstrated interest in learning. Expect positive outcomes  Education material provided: ADA - How to Thrive: A Guide for Your Journey with Diabetes; Diabetes MyPlate; Breakfast Ideas; Balanced Snacks; Tips for Eating Out with Diabetes  If problems or questions, patient to contact team via:  Phone and Email

## 2020-10-01 ENCOUNTER — Ambulatory Visit: Payer: Managed Care, Other (non HMO) | Admitting: Endocrinology

## 2020-10-18 ENCOUNTER — Other Ambulatory Visit: Payer: Self-pay | Admitting: Family

## 2020-12-22 ENCOUNTER — Other Ambulatory Visit: Payer: Self-pay | Admitting: Endocrinology

## 2021-01-05 DIAGNOSIS — H25813 Combined forms of age-related cataract, bilateral: Secondary | ICD-10-CM | POA: Diagnosis not present

## 2021-01-05 DIAGNOSIS — H40033 Anatomical narrow angle, bilateral: Secondary | ICD-10-CM | POA: Diagnosis not present

## 2021-01-05 DIAGNOSIS — E119 Type 2 diabetes mellitus without complications: Secondary | ICD-10-CM | POA: Diagnosis not present

## 2021-01-06 ENCOUNTER — Encounter: Payer: Self-pay | Admitting: Family Medicine

## 2021-01-06 ENCOUNTER — Other Ambulatory Visit: Payer: Self-pay | Admitting: Family Medicine

## 2021-01-06 ENCOUNTER — Other Ambulatory Visit: Payer: Self-pay

## 2021-01-06 ENCOUNTER — Ambulatory Visit (INDEPENDENT_AMBULATORY_CARE_PROVIDER_SITE_OTHER): Payer: Medicare Other | Admitting: Family Medicine

## 2021-01-06 VITALS — BP 121/76 | Temp 97.0°F | Ht 67.0 in | Wt 199.4 lb

## 2021-01-06 DIAGNOSIS — N952 Postmenopausal atrophic vaginitis: Secondary | ICD-10-CM | POA: Diagnosis not present

## 2021-01-06 DIAGNOSIS — E1165 Type 2 diabetes mellitus with hyperglycemia: Secondary | ICD-10-CM

## 2021-01-06 DIAGNOSIS — Z1159 Encounter for screening for other viral diseases: Secondary | ICD-10-CM

## 2021-01-06 DIAGNOSIS — E782 Mixed hyperlipidemia: Secondary | ICD-10-CM | POA: Diagnosis not present

## 2021-01-06 DIAGNOSIS — E0821 Diabetes mellitus due to underlying condition with diabetic nephropathy: Secondary | ICD-10-CM

## 2021-01-06 DIAGNOSIS — I10 Essential (primary) hypertension: Secondary | ICD-10-CM

## 2021-01-06 DIAGNOSIS — B351 Tinea unguium: Secondary | ICD-10-CM

## 2021-01-06 DIAGNOSIS — G629 Polyneuropathy, unspecified: Secondary | ICD-10-CM | POA: Insufficient documentation

## 2021-01-06 DIAGNOSIS — Z7689 Persons encountering health services in other specified circumstances: Secondary | ICD-10-CM

## 2021-01-06 DIAGNOSIS — Z1211 Encounter for screening for malignant neoplasm of colon: Secondary | ICD-10-CM

## 2021-01-06 LAB — BAYER DCA HB A1C WAIVED: HB A1C (BAYER DCA - WAIVED): 8.5 % — ABNORMAL HIGH (ref <7.0)

## 2021-01-06 MED ORDER — DEXCOM G6 SENSOR MISC: 1.0000 | 11 refills | Status: DC

## 2021-01-06 MED ORDER — PREMARIN 0.625 MG/GM VA CREA: 0.5000 | TOPICAL_CREAM | VAGINAL | 0 refills | Status: DC

## 2021-01-06 MED ORDER — DEXCOM G6 TRANSMITTER MISC: 1.0000 | 3 refills | Status: DC

## 2021-01-06 MED ORDER — JANUMET XR 50-1000 MG PO TB24
ORAL_TABLET | ORAL | 3 refills | Status: DC
Start: 2021-01-06 — End: 2021-03-10

## 2021-01-06 MED ORDER — GABAPENTIN 300 MG PO CAPS: 300.0000 mg | ORAL_CAPSULE | Freq: Three times a day (TID) | ORAL | 1 refills | Status: DC

## 2021-01-06 MED ORDER — INSULIN SYRINGES (DISPOSABLE) U-100 0.5 ML MISC: 1.0000 | Freq: Four times a day (QID) | 0 refills | Status: DC

## 2021-01-06 MED ORDER — INSULIN LISPRO 100 UNIT/ML ~~LOC~~ SOLN
SUBCUTANEOUS | 1 refills | Status: DC
Start: 2021-01-06 — End: 2021-01-14

## 2021-01-06 MED ORDER — LOSARTAN POTASSIUM-HCTZ 100-25 MG PO TABS
1.0000 | ORAL_TABLET | Freq: Every day | ORAL | 1 refills | Status: DC
Start: 2021-01-06 — End: 2021-04-09

## 2021-01-06 MED ORDER — DEXCOM G6 RECEIVER DEVI: 1.0000 | 0 refills | Status: DC

## 2021-01-06 MED ORDER — BASAGLAR KWIKPEN 100 UNIT/ML ~~LOC~~ SOPN: 32.0000 [IU] | PEN_INJECTOR | Freq: Every day | SUBCUTANEOUS | 3 refills | Status: DC

## 2021-01-06 MED ORDER — PREMARIN 0.625 MG/GM VA CREA: TOPICAL_CREAM | VAGINAL | 12 refills | Status: DC

## 2021-01-06 NOTE — Patient Instructions (Signed)
Atrophic Vaginitis  Atrophic vaginitis is a condition in which the tissues that line the vagina become dry and thin. This condition is most common in women who have stopped having regular menstrual periods (are in menopause). This usually starts when a woman is 74 to 67 years old. That is the time when a woman's estrogen levels begin to decrease. Estrogen is a female hormone. It helps to keep the tissues of the vagina moist. It stimulates the vagina to produce a clear fluid that lubricates the vagina for sex. This fluid also protects the vagina from infection. Lack of estrogen can cause the lining of the vagina to get thinner and dryer. The vagina may also shrink in size. It may become less elastic. Atrophic vaginitis tends to get worse over time as a woman's estrogen level drops. What are the causes? This condition is caused by the normal drop in estrogen that happens around the time of menopause. What increases the risk? Certain conditions or situations may lower a woman's estrogen level, leading to a higher risk for atrophic vaginitis. You are more likely to develop this condition if:  You are taking medicines that block estrogen.  You have had your ovaries removed.  You are being treated for cancer with radiation or medicines (chemotherapy).  You have given birth or are breastfeeding.  You are older than age 68.  You smoke. What are the signs or symptoms? Symptoms of this condition include:  Pain, soreness, a feeling of pressure, or bleeding during sex (dyspareunia).  Vaginal burning, irritation, or itching.  Pain or bleeding when a speculum is used in a vaginal exam.  Having burning pain while urinating.  Vaginal discharge. In some cases, there are no symptoms. How is this diagnosed? This condition is diagnosed based on your medical history and a physical exam. This will include a pelvic exam that checks the vaginal tissues. Though rare, you may also have other tests,  including:  A urine test.  A test that checks the acid balance in your vagina (acid balance test). How is this treated? Treatment for this condition depends on how severe your symptoms are. Treatment may include:  Using an over-the-counter vaginal lubricant before sex.  Using a long-acting vaginal moisturizer.  Using low-dose estrogen for moderate to severe symptoms that do not respond to other treatments. Options include creams, tablets, and inserts (vaginal rings). Before you use a vaginal estrogen, tell your health care provider if you have a history of: ? Breast cancer. ? Endometrial cancer. ? Blood clots. If you are not sexually active and your symptoms are very mild, you may not need treatment. Follow these instructions at home: Medicines  Take over-the-counter and prescription medicines only as told by your health care provider.  Do not use herbal or alternative medicines unless your health care provider says that you can.  Use over-the-counter creams, lubricants, or moisturizers for dryness only as told by your health care provider. General instructions  If your atrophic vaginitis is caused by menopause, discuss all of your menopause symptoms and treatment options with your health care provider.  Do not douche.  Do not use products that can make your vagina dry. These include: ? Scented feminine sprays. ? Scented tampons. ? Scented soaps.  Vaginal sex can help to improve blood flow and elasticity of vaginal tissue. If you choose to have sex and it hurts, try using a water-soluble lubricant or moisturizer right before having sex. Contact a health care provider if:  Your discharge looks  different than normal.  Your vagina has an unusual smell.  You have new symptoms.  Your symptoms do not improve with treatment.  Your symptoms get worse. Summary  Atrophic vaginitis is a condition in which the tissues that line the vagina become dry and thin. It is most common  in women who have stopped having regular menstrual periods (are in menopause).  Treatment options include using vaginal lubricants and low-dose vaginal estrogen.  Contact a health care provider if your vagina has an unusual smell, or if your symptoms get worse or do not improve after treatment. This information is not intended to replace advice given to you by your health care provider. Make sure you discuss any questions you have with your health care provider. Document Revised: 03/12/2020 Document Reviewed: 03/12/2020 Elsevier Patient Education  [place].

## 2021-01-06 NOTE — Progress Notes (Addendum)
New Patient Office Visit  Subjective:  Patient ID: Rebecca Cook, female    DOB: 09-11-1956  Age: 65 y.o. MRN: [id number]  CC:  Chief Complaint  Patient presents with  . New Patient (Initial Visit)    Been out of meds due to insurance     HPI Rebecca Cook presents to establish care.  She has a history of T2DM and recently had a mix up with insurance so she was out of insurance for about 1 months. She has been out of most of her medications for a month. She has been able to afford her humalog though. She has been checking her BS 4x a day. Her average has been around 280. Prior when on her medication, her average BS was around 100. She was diagnosed with T1DM last May after having shingles that attacked her pancreas and through her into T1DM. She was previously managed by endocrinologist. She has neuropathy in her feet. She is normally well controlled with gabapentin. She was not on a statin. She tried rosuvastatin but this made her nauseas. She takes Losartan-HCTZ for her HTN. She had a pap in September that was normal. She has recently become sexually active for the first time in many years and is experiences vaginal dryness and pain due to atrophy. She has tried some OTC creams without much improvement. She does have a toe nail fungus and would like to see podiatry.   Past Medical History:  Diagnosis Date  . Diabetes mellitus, type II ([place])   . Frequent headaches   . GERD (gastroesophageal reflux disease)   . Hypertension   . Kidney stones    None current, last had kidney stones in 1986    Past Surgical History:  Procedure Laterality Date  . TONSILLECTOMY AND ADENOIDECTOMY  1985  . TUBAL LIGATION  1985    Family History  Problem Relation Age of Onset  . Cancer Mother        Lung  . Heart disease Father   . Hypertension Father   . Heart disease Paternal Grandmother        Thyroid  . Hypertension Paternal Grandmother   . Cancer - Other Brother   . Diabetes Maternal  Grandfather     Social History   Socioeconomic History  . Marital status: Divorced    Spouse name: Not on file  . Number of children: 2  . Years of education: College  . Highest education level: Not on file  Occupational History    Employer: [employer]  Tobacco Use  . Smoking status: Former Research scientist (life sciences)  . Smokeless tobacco: Never Used  Substance and Sexual Activity  . Alcohol use: Yes    Alcohol/week: 0.0 standard drinks    Comment: occ  . Drug use: No  . Sexual activity: Yes    Birth control/protection: None  Other Topics Concern  . Not on file  Social History Narrative  . Not on file   Social Determinants of Health   Financial Resource Strain: Not on file  Food Insecurity: Not on file  Transportation Needs: Not on file  Physical Activity: Not on file  Stress: Not on file  Social Connections: Not on file  Intimate Partner Violence: Not on file    ROS Review of Systems  As per HPI.   Objective:   Today's Vitals: BP 121/76   Temp (!) 97 F (36.1 C) (Temporal)   Ht $R'5\' 7"'GM$  (1.702 m)   Wt 199 lb 6.4 oz (  90.4 kg)   BMI 31.23 kg/m   Physical Exam Vitals and nursing note reviewed.  Constitutional:      General: She is not in acute distress.    Appearance: Normal appearance. She is not ill-appearing, toxic-appearing or diaphoretic.  Cardiovascular:     Rate and Rhythm: Normal rate and regular rhythm.     Heart sounds: Normal heart sounds. No murmur heard.   Pulmonary:     Effort: Pulmonary effort is normal. No respiratory distress.     Breath sounds: Normal breath sounds.  Musculoskeletal:     Right lower leg: No edema.     Left lower leg: No edema.  Skin:    General: Skin is warm and dry.  Neurological:     General: No focal deficit present.     Mental Status: She is alert and oriented to person, place, and time.  Psychiatric:        Mood and Affect: Mood normal.        Behavior: Behavior normal.     Assessment & Plan:   Rebecca Cook was seen today  for new patient (initial visit).  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 Refills provided, restart previous medication regimen. Schedule appoint with Almyra Free in 4 weeks for DM. Referral to podiatry. Will request records for eye exam. Labs pending.  -     Bayer DCA Hb A1c Waived -     CBC with Differential/Platelet -     CMP14+EGFR -     Lipid panel -     Continuous Blood Gluc Transmit (DEXCOM G6 TRANSMITTER) MISC; 1 each by Does not apply route every 3 (three) months. E11.10 -     Continuous Blood Gluc Receiver ([place]) Graball; 1 each by Does not apply route See admin instructions. E11.10 -     Continuous Blood Gluc Sensor (DEXCOM G6 SENSOR) MISC; 1 each by Does not apply route See admin instructions. Change sensor every 10 days; E11.10 -     SitaGLIPtin-MetFORMIN HCl (JANUMET XR) 50-1000 MG TB24; TAKE 1 TABLET BY MOUTH TWICE A DAY BEFORE LUNCH AND SUPPER -     losartan-hydrochlorothiazide (HYZAAR) 100-25 MG tablet; Take 1 tablet by mouth daily. -     Insulin Glargine (BASAGLAR KWIKPEN) 100 UNIT/ML; Inject 32 Units into the skin at bedtime. -     insulin lispro (HUMALOG) 100 UNIT/ML injection; BEFORE EACH MEAL 3 TIMES DAILY 140-199-2 UNITS,200-250 4 UNITS, 251-299- 8 UNITS, 300-349 -10 UNITS, 350 OR ABOVE 14 UNITS -     gabapentin (NEURONTIN) 300 MG capsule; Take 1 capsule (300 mg total) by mouth 3 (three) times daily. -     Ambulatory referral to Podiatry  Primary hypertension BP at goal. Labs pending.  -     CBC with Differential/Platelet -     CMP14+EGFR -     Lipid panel  Mixed hyperlipidemia Not currently on statin therapy. Labs pending.  -     Lipid panel  Vaginal atrophy Try premarin as below. If no improvement, will refer to GYN.  -     conjugated estrogens (PREMARIN) vaginal cream; Use 7.62 applicator full daily at  bedtime x3 weeks, then discontinue x 1 week.  Nail fungus -     Ambulatory referral to Podiatry  Colon cancer screening -     Ambulatory referral to Gastroenterology  Encounter  for hepatitis C screening test for low risk patient -     Hepatitis C antibody  Encounter to establish care Reviewed available records.   Follow-up: Return in about 4 weeks (around 02/03/2021) for with Almyra Free for DM.   The patient indicates understanding of these issues and agrees with the plan.   Gwenlyn Perking, FNP

## 2021-01-07 ENCOUNTER — Other Ambulatory Visit: Payer: Self-pay | Admitting: Family Medicine

## 2021-01-07 DIAGNOSIS — E782 Mixed hyperlipidemia: Secondary | ICD-10-CM

## 2021-01-07 LAB — CBC WITH DIFFERENTIAL/PLATELET
Basophils Absolute: 0.1 10*3/uL (ref 0.0–0.2)
Basos: 1 %
EOS (ABSOLUTE): 0.4 10*3/uL (ref 0.0–0.4)
Eos: 5 %
Hematocrit: 45.4 % (ref 34.0–46.6)
Hemoglobin: 15.1 g/dL (ref 11.1–15.9)
Immature Grans (Abs): 0 10*3/uL (ref 0.0–0.1)
Immature Granulocytes: 0 %
Lymphocytes Absolute: 2.7 10*3/uL (ref 0.7–3.1)
Lymphs: 34 %
MCH: 27.8 pg (ref 26.6–33.0)
MCHC: 33.3 g/dL (ref 31.5–35.7)
MCV: 84 fL (ref 79–97)
Monocytes Absolute: 0.5 10*3/uL (ref 0.1–0.9)
Monocytes: 6 %
Neutrophils Absolute: 4.4 10*3/uL (ref 1.4–7.0)
Neutrophils: 54 %
Platelets: 251 10*3/uL (ref 150–450)
RBC: 5.43 x10E6/uL — ABNORMAL HIGH (ref 3.77–5.28)
RDW: 13 % (ref 11.7–15.4)
WBC: 8.1 10*3/uL (ref 3.4–10.8)

## 2021-01-07 LAB — LIPID PANEL
Chol/HDL Ratio: 6 ratio — ABNORMAL HIGH (ref 0.0–4.4)
Cholesterol, Total: 235 mg/dL — ABNORMAL HIGH (ref 100–199)
HDL: 39 mg/dL — ABNORMAL LOW (ref >39)
LDL Chol Calc (NIH): 149 mg/dL — ABNORMAL HIGH (ref 0–99)
Triglycerides: 254 mg/dL — ABNORMAL HIGH (ref 0–149)
VLDL Cholesterol Cal: 47 mg/dL — ABNORMAL HIGH (ref 5–40)

## 2021-01-07 LAB — CMP14+EGFR
ALT: 16 IU/L (ref 0–32)
AST: 15 IU/L (ref 0–40)
Albumin/Globulin Ratio: 1.4 (ref 1.2–2.2)
Albumin: 4.2 g/dL (ref 3.8–4.8)
Alkaline Phosphatase: 80 IU/L (ref 44–121)
BUN/Creatinine Ratio: 33 — ABNORMAL HIGH (ref 12–28)
BUN: 25 mg/dL (ref 8–27)
Bilirubin Total: 0.4 mg/dL (ref 0.0–1.2)
CO2: 24 mmol/L (ref 20–29)
Calcium: 10 mg/dL (ref 8.7–10.3)
Chloride: 97 mmol/L (ref 96–106)
Creatinine, Ser: 0.75 mg/dL (ref 0.57–1.00)
Globulin, Total: 3 g/dL (ref 1.5–4.5)
Glucose: 306 mg/dL — ABNORMAL HIGH (ref 65–99)
Potassium: 4.1 mmol/L (ref 3.5–5.2)
Sodium: 138 mmol/L (ref 134–144)
Total Protein: 7.2 g/dL (ref 6.0–8.5)
eGFR: 88 mL/min/{1.73_m2} (ref >59)

## 2021-01-07 LAB — HEPATITIS C ANTIBODY: Hep C Virus Ab: 0.2 s/co ratio (ref 0.0–0.9)

## 2021-01-07 MED ORDER — ATORVASTATIN CALCIUM 10 MG PO TABS: 10.0000 mg | ORAL_TABLET | Freq: Every day | ORAL | 3 refills | Status: DC

## 2021-01-11 ENCOUNTER — Encounter: Payer: Self-pay | Admitting: Internal Medicine

## 2021-01-12 ENCOUNTER — Telehealth: Payer: Self-pay | Admitting: Family Medicine

## 2021-01-14 ENCOUNTER — Other Ambulatory Visit: Payer: Self-pay | Admitting: Family Medicine

## 2021-01-14 ENCOUNTER — Encounter: Payer: Self-pay | Admitting: Family Medicine

## 2021-01-14 DIAGNOSIS — N952 Postmenopausal atrophic vaginitis: Secondary | ICD-10-CM

## 2021-01-14 DIAGNOSIS — E0821 Diabetes mellitus due to underlying condition with diabetic nephropathy: Secondary | ICD-10-CM

## 2021-01-14 MED ORDER — ESTRADIOL 0.1 MG/GM VA CREA: 1.0000 | TOPICAL_CREAM | VAGINAL | 12 refills | Status: DC

## 2021-01-14 MED ORDER — INSULIN LISPRO (1 UNIT DIAL) 100 UNIT/ML (KWIKPEN): PEN_INJECTOR | SUBCUTANEOUS | 1 refills | Status: DC

## 2021-01-14 MED ORDER — INSULIN SYRINGES (DISPOSABLE) U-100 0.5 ML MISC: 1.0000 | Freq: Four times a day (QID) | 0 refills | Status: DC

## 2021-01-14 MED ORDER — INSULIN PEN NEEDLE 32G X 6 MM MISC: 1.0000 | Freq: Every day | 3 refills | Status: DC

## 2021-01-21 ENCOUNTER — Telehealth: Payer: Self-pay

## 2021-01-28 DIAGNOSIS — M79676 Pain in unspecified toe(s): Secondary | ICD-10-CM | POA: Diagnosis not present

## 2021-01-28 DIAGNOSIS — B351 Tinea unguium: Secondary | ICD-10-CM | POA: Diagnosis not present

## 2021-02-04 ENCOUNTER — Ambulatory Visit: Payer: Self-pay

## 2021-02-04 ENCOUNTER — Ambulatory Visit: Payer: Medicare Other | Admitting: Pharmacist

## 2021-02-10 ENCOUNTER — Ambulatory Visit (INDEPENDENT_AMBULATORY_CARE_PROVIDER_SITE_OTHER): Payer: Medicare Other | Admitting: *Deleted

## 2021-02-10 ENCOUNTER — Other Ambulatory Visit: Payer: Self-pay

## 2021-02-10 VITALS — Ht 66.0 in | Wt 196.4 lb

## 2021-02-10 DIAGNOSIS — Z1211 Encounter for screening for malignant neoplasm of colon: Secondary | ICD-10-CM

## 2021-02-10 NOTE — Progress Notes (Addendum)
Gastroenterology Pre-Procedure Review  Request Date: 02/10/2021 Requesting Physician: Marjorie Smolder, NP @ [facility] [place], Last TCS 16 years ago in [place], pt could not remember physician who did it, no polyps per pt  PATIENT REVIEW QUESTIONS: The patient responded to the following health history questions as indicated:    1. Diabetes Melitis: yes, type I 2. Joint replacements in the past 12 months: no 3. Major health problems in the past 3 months: no 4. Has an artificial valve or MVP: no 5. Has a defibrillator: no 6. Has been advised in past to take antibiotics in advance of a procedure like teeth cleaning: no 7. Family history of colon cancer: no  8. Alcohol Use: yes, 1 glass of wine every 3 months 9. Illicit drug Use: no 10. History of sleep apnea: no  11. History of coronary artery or other vascular stents placed within the last 12 months: no 12. History of any prior anesthesia complications: no 13. Body mass index is 31.7 kg/m.    MEDICATIONS & ALLERGIES:    Patient reports the following regarding taking any blood thinners:   Plavix? no Aspirin? no Coumadin? no Brilinta? no Xarelto? no Eliquis? no Pradaxa? no Savaysa? no Effient? no  Patient confirms/reports the following medications:  Current Outpatient Medications  Medication Sig Dispense Refill  . estradiol (ESTRACE VAGINAL) 0.1 MG/GM vaginal cream Place 1 Applicatorful vaginally 3 (three) times a week. 42.5 g 12  . gabapentin (NEURONTIN) 300 MG capsule Take 1 capsule (300 mg total) by mouth 3 (three) times daily. 270 capsule 1  . Insulin Glargine (BASAGLAR KWIKPEN) 100 UNIT/ML Inject 32 Units into the skin at bedtime. 45 mL 3  . insulin lispro (HUMALOG KWIKPEN) 100 UNIT/ML KwikPen Before each meal, 3x daily. For blood sugar of 140-199: 2 units, 200-250: 4 units, 251-299: 8 units, 300-349: 10 units, for 350 or above: 14 units. 15 mL 1  . loratadine (CLARITIN) 10 MG tablet Take 10 mg by mouth daily.    Marland Kitchen  losartan-hydrochlorothiazide (HYZAAR) 100-25 MG tablet Take 1 tablet by mouth daily. 90 tablet 1  . Multiple Vitamins-Minerals (MULTIVITAMIN PO) Take 1 tablet by mouth daily.     . SitaGLIPtin-MetFORMIN HCl (JANUMET XR) 50-1000 MG TB24 TAKE 1 TABLET BY MOUTH TWICE A DAY BEFORE LUNCH AND SUPPER 60 tablet 3  . UNABLE TO FIND Collagen Peptide - 1 scoop qd     No current facility-administered medications for this visit.    Patient confirms/reports the following allergies:  No Known Allergies  No orders of the defined types were placed in this encounter.   AUTHORIZATION INFORMATION Primary Insurance: BCBS Medicare,  [place] #: [id number],  Group #: [id number] Pre-Cert / Auth required: No, not required  SCHEDULE INFORMATION: Procedure has been scheduled as follows:  Date: 04/02/2021, Time: 9:30 Location: [facility] with Dr. Abbey Chatters  This Gastroenterology Pre-Precedure Review Form is being routed to the following provider(s): Neil Crouch, PA

## 2021-02-10 NOTE — Progress Notes (Signed)
Pt made aware that I will call her once July procedure schedules have been released.    Pt to call back with name and dosage of cholesterol medication that she is currently taking.

## 2021-02-12 ENCOUNTER — Ambulatory Visit: Payer: Medicare Other | Admitting: Pharmacist

## 2021-02-23 ENCOUNTER — Ambulatory Visit: Payer: Self-pay

## 2021-02-23 NOTE — Progress Notes (Signed)
ASA II. OK for conscious or propofol. Day of prep: humalog, continue sliding scale. Janumet 1/2 tab BID. AM of TCS: hold humalog, janumet.

## 2021-02-24 ENCOUNTER — Telehealth: Payer: Self-pay | Admitting: Family Medicine

## 2021-02-25 ENCOUNTER — Other Ambulatory Visit: Payer: Self-pay

## 2021-02-25 ENCOUNTER — Ambulatory Visit (INDEPENDENT_AMBULATORY_CARE_PROVIDER_SITE_OTHER): Payer: Medicare Other | Admitting: Pharmacist

## 2021-02-25 DIAGNOSIS — E119 Type 2 diabetes mellitus without complications: Secondary | ICD-10-CM | POA: Diagnosis not present

## 2021-02-25 DIAGNOSIS — N952 Postmenopausal atrophic vaginitis: Secondary | ICD-10-CM

## 2021-02-25 MED ORDER — CETIRIZINE HCL 10 MG PO TABS: 10.0000 mg | ORAL_TABLET | Freq: Every day | ORAL | 11 refills | Status: DC

## 2021-02-25 MED ORDER — ROSUVASTATIN CALCIUM 10 MG PO TABS: 10.0000 mg | ORAL_TABLET | Freq: Every day | ORAL | 3 refills | Status: DC

## 2021-02-25 NOTE — Progress Notes (Signed)
    02/25/2021 Name: Rebecca Cook MRN: [id number] DOB: 1956-02-01   S:  65 yoF Presents for diabetes evaluation, education, and management Patient was referred and last seen by Primary Care Provider on 01/06/21.  Insurance coverage/medication affordability: BCBS  Patient reports adherence with medications. . Current diabetes medications include: basaglar, humalog, janumet . Current hypertension medications include: losartan, hctz Goal 130/80 . Current hyperlipidemia medications include: rosuvastatin    Patient denies hypoglycemic events.   Patient reported dietary habits: Eats 3 meals/day  Patient-reported exercise habits: n/a  O:  Lab Results  Component Value Date   HGBA1C 8.5 (H) 01/06/2021   Lipid Panel     Component Value Date/Time   CHOL 235 (H) 01/06/2021 1114   TRIG 254 (H) 01/06/2021 1114   HDL 39 (L) 01/06/2021 1114   CHOLHDL 6.0 (H) 01/06/2021 1114   CHOLHDL 6 10/17/2017 1534   VLDL 67.0 (H) 10/17/2017 1534   LDLCALC 149 (H) 01/06/2021 1114   LDLDIRECT 195.0 10/17/2017 1534    Clinical Atherosclerotic Cardiovascular Disease (ASCVD): No   The 10-year ASCVD risk score Mikey Bussing DC Jr., et al., 2013) is: 16.9%   Values used to calculate the score:     Age: 65 years     Sex: Female     Is Non-Hispanic African American: No     Diabetic: Yes     Tobacco smoker: No     Systolic Blood Pressure: 767 mmHg     Is BP treated: Yes     HDL Cholesterol: 39 mg/dL     Total Cholesterol: 235 mg/dL    A/P:  Diabetes T1DM currently uncontrolled.  Patient is able to verbalize appropriate hypoglycemia management plan. Patient is adherent with medication.   Lispro 10-12 units per meal Basaglar 22 units nightly Submitted application in PAP with Community education officer for [place] ships out 39-month supplies to patient's home every quarter ONLY as requested by patient   Hold Janumet -- unsure if working for patient since type 1  -Extensively discussed  pathophysiology of diabetes, recommended lifestyle interventions, dietary effects on blood sugar control  -Counseled on s/sx of and management of hypoglycemia   Written patient instructions provided.  Total time in face to face counseling 25 minutes.    Regina Eck, PharmD, BCPS Clinical Pharmacist, [place]  [facility] Phone [phone]

## 2021-03-02 ENCOUNTER — Encounter: Payer: Self-pay | Admitting: *Deleted

## 2021-03-02 ENCOUNTER — Other Ambulatory Visit: Payer: Self-pay | Admitting: *Deleted

## 2021-03-02 DIAGNOSIS — Z139 Encounter for screening, unspecified: Secondary | ICD-10-CM

## 2021-03-02 MED ORDER — NA SULFATE-K SULFATE-MG SULF 17.5-3.13-1.6 GM/177ML PO SOLN: 1.0000 | Freq: Once | ORAL | 0 refills | Status: AC

## 2021-03-02 NOTE — Addendum Note (Signed)
Addended by: Metro Kung on: 03/02/2021 02:26 PM   Modules accepted: Orders

## 2021-03-02 NOTE — Patient Instructions (Addendum)
Rebecca Cook  1956-02-22 MRN: [id number]     Procedure Date:  04/02/2021 Time to register: 8:00 AM Place to register: [facility] Scheduled provider: Dr. Abbey Chatters    PREPARATION FOR COLONOSCOPY WITH SUPREP BOWEL PREP KIT  Note: Suprep Bowel Prep Kit is a split-dose (2day) regimen. Consumption of BOTH 6-ounce bottles is required for a complete prep.  Please notify us immediately if you are diabetic, take iron supplements, or if you are on Coumadin or any other blood thinners.  Please hold the following medications: See letter.                                                                                                                                                  2 DAYS BEFORE PROCEDURE:  DATE: 03/31/2021   DAY: Wednesday Begin clear liquid diet AFTER your lunch meal. NO SOLID FOODS after this point.  1 DAY BEFORE PROCEDURE:  DATE: 04/01/2021   DAY: Thursday Continue clear liquids the entire day - NO SOLID FOOD.   Diabetic medications adjustments for today: See letter.  At 6:00pm: Complete steps 1 through 4 below, using ONE (1) 6-ounce bottle, before going to bed. Step 1:  Pour ONE (1) 6-ounce bottle of SUPREP liquid into the mixing container.  Step 2:  Add cool drinking water to the 16 ounce line on the container and mix.  Note: Dilute the solution concentrate as directed prior to use. Step 3:  DRINK ALL the liquid in the container. Step 4:  You MUST drink an additional two (2) or more 16 ounce containers of water over the next one (1) hour.   Continue clear liquids.  DAY OF PROCEDURE:   DATE: 04/02/2021   DAY: Friday If you take medications for your heart, blood pressure, or breathing, you may take these medications.  Diabetic medications adjustments for today: See letter.  5 hours before your procedure at  4:30 AM: Step 1:  Pour ONE (1) 6-ounce bottle of SUPREP liquid into the mixing container.  Step 2:  Add cool drinking water to the 16 ounce line on the  container and mix.  Note: Dilute the solution concentrate as directed prior to use. Step 3:  DRINK ALL the liquid in the container. Step 4:  You MUST drink an additional two (2) or more 16 ounce containers of water over the next one (1) hour. You MUST complete the final glass of water at least 3 hours before your colonoscopy. Nothing by mouth past 6:30 AM.  You may take your morning medications with sip of water unless we have instructed otherwise.    Please see below for Dietary Information.  CLEAR LIQUIDS INCLUDE:  Water Jello (NOT red in color)   Ice Popsicles (NOT red in color)   Tea (sugar ok, no milk/cream) Powdered fruit flavored drinks  Coffee (sugar ok,  no milk/cream) Gatorade/ Lemonade/ Kool-Aid  (NOT red in color)   Juice: apple, white grape, white cranberry Soft drinks  Clear bullion, consomme, broth (fat free beef/chicken/vegetable)  Carbonated beverages (any kind)  Strained chicken noodle soup Hard Candy   Remember: Clear liquids are liquids that will allow you to see your fingers on the other side of a clear glass. Be sure liquids are NOT red in color, and not cloudy, but CLEAR.  DO NOT EAT OR DRINK ANY OF THE FOLLOWING:  Dairy products of any kind   Cranberry juice Tomato juice / V8 juice   Grapefruit juice Orange juice     Red grape juice  Do not eat any solid foods, including such foods as: cereal, oatmeal, yogurt, fruits, vegetables, creamed soups, eggs, bread, crackers, pureed foods in a blender, etc.   HELPFUL HINTS FOR DRINKING PREP SOLUTION:   Make sure prep is extremely cold. Mix and refrigerate the the morning of the prep. You may also put in the freezer.   You may try mixing some Crystal Light or Country Time Lemonade if you prefer. Mix in small amounts; add more if necessary.  Try drinking through a straw  Rinse mouth with water or a mouthwash between glasses, to remove after-taste.  Try sipping on a cold beverage /ice/ popsicles between glasses of  prep.  Place a piece of sugar-free hard candy in mouth between glasses.  If you become nauseated, try consuming smaller amounts, or stretch out the time between glasses. Stop for 30-60 minutes, then slowly start back drinking.     OTHER INSTRUCTIONS  You will need a responsible adult at least 67 years of age to accompany you and drive you home. This person must remain in the waiting room during your procedure. The hospital will cancel your procedure if you do not have a responsible adult with you.   1. Wear loose fitting clothing that is easily removed. 2. Leave jewelry and other valuables at home.  3. Remove all body piercing jewelry and leave at home. 4. Total time from sign-in until discharge is approximately 2-3 hours. 5. You should go home directly after your procedure and rest. You can resume normal activities the day after your procedure. 6. The day of your procedure you should not:  Drive  Make legal decisions  Operate machinery  Drink alcohol  Return to work   You may call the office (Dept: [phone]) before 5:00pm, or page the doctor on call ([phone]) after 5:00pm, for further instructions, if necessary.   Insurance Information YOU WILL NEED TO CHECK WITH YOUR INSURANCE COMPANY FOR THE BENEFITS OF COVERAGE YOU HAVE FOR THIS PROCEDURE.  UNFORTUNATELY, NOT ALL INSURANCE COMPANIES HAVE BENEFITS TO COVER ALL OR PART OF THESE TYPES OF PROCEDURES.  IT IS YOUR RESPONSIBILITY TO CHECK YOUR BENEFITS, HOWEVER, WE WILL BE GLAD TO ASSIST YOU WITH ANY CODES YOUR INSURANCE COMPANY MAY NEED.    PLEASE NOTE THAT MOST INSURANCE COMPANIES WILL NOT COVER A SCREENING COLONOSCOPY FOR PEOPLE UNDER THE AGE OF 50  IF YOU HAVE BCBS INSURANCE, YOU MAY HAVE BENEFITS FOR A SCREENING COLONOSCOPY BUT IF POLYPS ARE FOUND THE DIAGNOSIS WILL CHANGE AND THEN YOU MAY HAVE A DEDUCTIBLE THAT WILL NEED TO BE MET. SO PLEASE MAKE SURE YOU CHECK YOUR BENEFITS FOR A SCREENING COLONOSCOPY AS WELL AS A  DIAGNOSTIC COLONOSCOPY.      

## 2021-03-02 NOTE — Progress Notes (Addendum)
Spoke with pt.  She scheduled procedure for 04/02/2021 at 9:30, arrival: 8:00.  Pt aware that I will mail her prep instructions and diabetes medication adjustments.    Neil Crouch, PA-C:  Pt informed me that she is taking Rosuvastatin 10 mg once daily.  She said that her PCP took her off of [place].  Updated med list.

## 2021-03-08 ENCOUNTER — Telehealth: Payer: Self-pay | Admitting: Family Medicine

## 2021-03-08 NOTE — Telephone Encounter (Signed)
Needs to talk Almyra Free about her blood sugar #'s for last week.

## 2021-03-09 ENCOUNTER — Encounter: Payer: Self-pay | Admitting: Family Medicine

## 2021-03-10 DIAGNOSIS — H52213 Irregular astigmatism, bilateral: Secondary | ICD-10-CM | POA: Diagnosis not present

## 2021-03-10 DIAGNOSIS — B0230 Zoster ocular disease, unspecified: Secondary | ICD-10-CM | POA: Diagnosis not present

## 2021-03-10 DIAGNOSIS — H25813 Combined forms of age-related cataract, bilateral: Secondary | ICD-10-CM | POA: Diagnosis not present

## 2021-03-10 DIAGNOSIS — E103291 Type 1 diabetes mellitus with mild nonproliferative diabetic retinopathy without macular edema, right eye: Secondary | ICD-10-CM | POA: Diagnosis not present

## 2021-03-10 NOTE — Progress Notes (Addendum)
Noted. Continue sliding scale insulin. Please have her take only 15 units of insulin Glargine at bedtime night before colonoscopy. Hold insulin glargine am of procedure.

## 2021-03-10 NOTE — Addendum Note (Signed)
Addended by: Mahala Menghini on: 03/10/2021 09:28 PM   Modules accepted: Orders

## 2021-03-11 ENCOUNTER — Encounter: Payer: Self-pay | Admitting: *Deleted

## 2021-03-11 NOTE — Progress Notes (Signed)
Mailed letter to pt with diabetes medication adjustments.   

## 2021-03-19 NOTE — Telephone Encounter (Signed)
Left VM encouraging return call

## 2021-03-22 ENCOUNTER — Telehealth: Payer: Self-pay | Admitting: Family Medicine

## 2021-03-22 NOTE — Telephone Encounter (Signed)
Pt aware Almyra Free is not here today. Pt is rc her call. Pt wants to let Almyra Free know that her numbers are not good. Pt will wait for a cal back--Should pt still keep her apt on Friday? Please call back

## 2021-03-23 ENCOUNTER — Telehealth: Payer: Self-pay | Admitting: Internal Medicine

## 2021-03-23 ENCOUNTER — Ambulatory Visit (INDEPENDENT_AMBULATORY_CARE_PROVIDER_SITE_OTHER): Payer: Medicare Other | Admitting: Pharmacist

## 2021-03-23 DIAGNOSIS — E119 Type 2 diabetes mellitus without complications: Secondary | ICD-10-CM

## 2021-03-23 NOTE — Telephone Encounter (Signed)
Noted  

## 2021-03-23 NOTE — Telephone Encounter (Signed)
PATIENT CALLED NEEDING TO SPEAK TO YOU, PLEASE CALL BACK

## 2021-03-23 NOTE — Progress Notes (Signed)
Chronic Care Management Pharmacy Note  03/23/2021 Name:  Rebecca Cook MRN:  [id number] DOB:  09/19/1956  Summary: DIABETES MANAGEMENT  Recommendations/Changes made from today's visit: Diabetes: Uncontrolled; current treatment:BASAGLAR, HUMALOG;  WE DISCONTINUED THE JANUMET DUE TO T1DM DIAGNOSIS Bgs have since gone up--staying in 200s Increase Basaglar to 35 units nightly-patient verbalizes understanding She has been administering additional Humalog at night Humalog sliding scale as follows: Before each meal, 3x daily. For blood sugar of 140-199: 2 units, 200-250: 4 units, 251-299: 8 units, 300-349: 10 units, for 350 or above: 14 units. Current glucose readings: fasting glucose: 200s, post prandial glucose: 200s+ reports hyperglycemic symptoms Discussed meal planning options and Plate method for healthy eating (30-45 cabs per meal, 2x15g snacks) Avoid sugary drinks and desserts Incorporate balanced protein, non starchy veggies, 1 serving of carbohydrate with each meal Increase water intake Increase physical activity as able Current exercise: n/a Educated on diet, medication adjustments Assessed patient finances. Enrolled patient in [employer] for basaglar and humalog insulin pens--will follow application process  Plan: F/U PHARMD 03/26/21  Subjective: Rebecca Cook is an 65 y.o. year old female who is a primary patient of Gwenlyn Perking, FNP.  The CCM team was consulted for assistance with disease management and care coordination needs.    Engaged with patient by telephone for initial visit in response to provider referral for pharmacy case management and/or care coordination services.   Consent to Services:  The patient was given the following information about Chronic Care Management services today, agreed to services, and gave verbal consent: 1. CCM service includes personalized support from designated clinical staff supervised by the primary care provider,  including individualized plan of care and coordination with other care providers 2. 24/7 contact phone numbers for assistance for urgent and routine care needs. 3. Service will only be billed when office clinical staff spend 20 minutes or more in a month to coordinate care. 4. Only one practitioner may furnish and bill the service in a calendar month. 5.The patient may stop CCM services at any time (effective at the end of the month) by phone call to the office staff. 6. The patient will be responsible for cost sharing (co-pay) of up to 20% of the service fee (after annual deductible is met). Patient agreed to services and consent obtained.  Patient Care Team: Gwenlyn Perking, FNP as PCP - General (Family Medicine) Gala Romney Cristopher Estimable, MD as Consulting Physician (Gastroenterology)   Hospital visits: None in previous 6 months  Objective:  Lab Results  Component Value Date   CREATININE 0.75 01/06/2021   CREATININE 0.63 03/02/2020   CREATININE 0.48 02/19/2020    Lab Results  Component Value Date   HGBA1C 8.5 (H) 01/06/2021   Last diabetic Eye exam: No results found for: HMDIABEYEEXA  Last diabetic Foot exam: No results found for: HMDIABFOOTEX      Component Value Date/Time   CHOL 235 (H) 01/06/2021 1114   TRIG 254 (H) 01/06/2021 1114   HDL 39 (L) 01/06/2021 1114   CHOLHDL 6.0 (H) 01/06/2021 1114   CHOLHDL 6 10/17/2017 1534   VLDL 67.0 (H) 10/17/2017 1534   LDLCALC 149 (H) 01/06/2021 1114   LDLDIRECT 195.0 10/17/2017 1534    Hepatic Function Latest Ref Rng & Units 01/06/2021 03/02/2020 02/18/2020  Total Protein 6.0 - 8.5 g/dL 7.2 7.0 5.6(L)  Albumin 3.8 - 4.8 g/dL 4.2 4.0 2.7(L)  AST 0 - 40 IU/L $Remov'15 20 24  'vCgyhL$ ALT 0 - 32  IU/L '16 23 25  '$ Alk Phosphatase 44 - 121 IU/L 80 69 64  Total Bilirubin 0.0 - 1.2 mg/dL 0.4 0.7 1.1  Bilirubin, Direct 0.0 - 0.3 mg/dL - - -    Lab Results  Component Value Date/Time   TSH 2.11 10/17/2017 03:34 PM   TSH 1.94 09/11/2014 07:44 AM    CBC Latest Ref  Rng & Units 01/06/2021 03/02/2020 02/18/2020  WBC 3.4 - 10.8 x10E3/uL 8.1 7.1 8.3  Hemoglobin 11.1 - 15.9 g/dL 15.1 15.0 13.9  Hematocrit 34.0 - 46.6 % 45.4 44.8 41.1  Platelets 150 - 450 x10E3/uL 251 245.0 166    No results found for: VD25OH  Clinical ASCVD: No  The 10-year ASCVD risk score Mikey Bussing DC Jr., et al., 2013) is: 16.9%   Values used to calculate the score:     Age: 28 years     Sex: Female     Is Non-Hispanic African American: No     Diabetic: Yes     Tobacco smoker: No     Systolic Blood Pressure: 161 mmHg     Is BP treated: Yes     HDL Cholesterol: 39 mg/dL     Total Cholesterol: 235 mg/dL    Other: (CHADS2VASc if Afib, PHQ9 if depression, MMRC or CAT for COPD, ACT, DEXA)  Social History   Tobacco Use  Smoking Status Former   Pack years: 0.00  Smokeless Tobacco Never   BP Readings from Last 3 Encounters:  01/06/21 121/76  08/17/20 128/82  07/01/20 (!) 142/88   Pulse Readings from Last 3 Encounters:  08/17/20 89  07/01/20 91  05/12/20 (!) 104   Wt Readings from Last 3 Encounters:  02/10/21 196 lb 6.4 oz (89.1 kg)  01/06/21 199 lb 6.4 oz (90.4 kg)  08/17/20 194 lb 14.4 oz (88.4 kg)    Assessment: Review of patient past medical history, allergies, medications, health status, including review of consultants reports, laboratory and other test data, was performed as part of comprehensive evaluation and provision of chronic care management services.   SDOH:  (Social Determinants of Health) assessments and interventions performed:    CCM Care Plan  No Known Allergies  Medications Reviewed Today     Reviewed by Lavera Guise, [facility] (Pharmacist) on 03/25/21 at 0907  Med List Status: <None>   Medication Order Taking? Sig Documenting Provider Last Dose Status Informant  cetirizine (ZYRTEC) 10 MG tablet [id number]  Take 1 tablet (10 mg total) by mouth daily. Gwenlyn Perking, FNP  Active   estradiol (ESTRACE VAGINAL) 0.1 MG/GM vaginal cream [id number] No Place  1 Applicatorful vaginally 3 (three) times a week. Gwenlyn Perking, FNP Taking Active   gabapentin (NEURONTIN) 300 MG capsule [id number] No Take 1 capsule (300 mg total) by mouth 3 (three) times daily. Gwenlyn Perking, FNP Taking Active   Insulin Glargine [facility] KWIKPEN) 100 UNIT/ML [id number] No Inject 32 Units into the skin at bedtime. Gwenlyn Perking, FNP Taking Active   insulin lispro (HUMALOG KWIKPEN) 100 UNIT/ML KwikPen [id number] No Before each meal, 3x daily. For blood sugar of 140-199: 2 units, 200-250: 4 units, 251-299: 8 units, 300-349: 10 units, for 350 or above: 14 units. Gwenlyn Perking, FNP Taking Active   losartan-hydrochlorothiazide [facility]) 100-25 MG tablet [id number] No Take 1 tablet by mouth daily. Gwenlyn Perking, FNP Taking Active   Multiple Vitamins-Minerals (MULTIVITAMIN PO) [id number] No Take 1 tablet by mouth daily.  [provider] Taking Active Pharmacy Records  rosuvastatin (CRESTOR)  10 MG tablet [id number] No Take 1 tablet (10 mg total) by mouth daily. Gwenlyn Perking, FNP Taking Active   UNABLE TO FIND [id number] No Collagen Peptide - 1 scoop qd [provider] Taking Active   Med List Note Carie Caddy 02/16/20 1543): Daughter Dancy: [phone]            Patient Active Problem List   Diagnosis Date Noted   Neuropathy 01/06/2021   Uterine leiomyoma 06/18/2020   DKA, type 2 ([place]) 02/16/2020   Hyperkalemia 02/16/2020   Hypercalcemia 50/35/4656   Acute metabolic encephalopathy 81/27/5170   AKI (acute kidney injury) ([place]) 02/16/2020   Shingles of eyelid 02/16/2020   Hypertensive urgency 02/16/2020   Hidradenitis axillaris 08/31/2016   Visit for screening mammogram 08/31/2016   Impingement syndrome of right shoulder 08/12/2016   Anxiety and depression 11/19/2015   Hyperlipidemia 09/02/2014   Uncontrolled diabetes mellitus ([place]) 11/22/2013   HTN (hypertension) 11/22/2013    Immunization History  Administered  Date(s) Administered   Influenza,inj,Quad PF,6+ Mos 10/17/2017, 06/05/2019   Influenza-Unspecified 07/21/2014   Moderna Sars-Covid-2 Vaccination 12/09/2019, 12/29/2019, 08/29/2020   Pneumococcal Conjugate-13 03/16/2020   Tdap 03/16/2020    Conditions to be addressed/monitored: HTN, HLD, and T1DM  Care Plan : PHARMD MEDICATION MANAGEMENT  Updates made by Lavera Guise, [place] since 03/25/2021 12:00 AM     Problem: DISEASE PROGRESSION PREVENTION      Long-Range Goal: T2DM   This Visit's Progress: Not on track  Priority: High  Note:   Current Barriers:  Unable to independently afford treatment regimen--MEDICARE COVERAGE GAP, HIGH COPAYS Unable to achieve control of T2DM  Suboptimal therapeutic regimen for T2DM   Pharmacist Clinical Goal(s):  Over the next 90 days, patient will verbalize ability to afford treatment regimen achieve control of T2DM as evidenced by GOAL A1C<7%, CONTROLLED BLOOD SUGARS adhere to prescribed medication regimen as evidenced by IMPROVED GLYCEMIC CONTROL  through collaboration with PharmD and provider.    Interventions: 1:1 collaboration with Gwenlyn Perking, FNP regarding development and update of comprehensive plan of care as evidenced by provider attestation and co-signature Inter-disciplinary care team collaboration (see longitudinal plan of care) Comprehensive medication review performed; medication list updated in electronic medical record  Diabetes: Uncontrolled; current treatment:BASAGLAR, HUMALOG;  WE DISCONTINUED THE JANUMET DUE TO T1DM DIAGNOSIS Bgs have since gone up--staying in 200s Increase Basaglar to 35 units nightly-patient verbalizes understanding She has been administering additional Humalog at night Humalog sliding scale as follows: Before each meal, 3x daily. For blood sugar of 140-199: 2 units, 200-250: 4 units, 251-299: 8 units, 300-349: 10 units, for 350 or above: 14 units. Current glucose readings: fasting glucose: 200s, post  prandial glucose: 200s+ reports hyperglycemic symptoms Discussed meal planning options and Plate method for healthy eating (30-45 cabs per meal, 2x15g snacks) Avoid sugary drinks and desserts Incorporate balanced protein, non starchy veggies, 1 serving of carbohydrate with each meal Increase water intake Increase physical activity as able Current exercise: n/a Educated on diet, medication adjustments Assessed patient finances. Enrolled patient in [employer] for basaglar and humalog insulin pens--will follow application process  Patient Goals/Self-Care Activities Over the next 90 days, patient will:  - take medications as prescribed check glucose CONTINUOUSLY USING CGM, document, and provide at future appointments engage in dietary modifications by [place] METHOD  Follow Up Plan: Telephone follow up appointment with care management team member scheduled for: 03/26/21      Medication Assistance: Application for [employer]  medication assistance program. in process.  Anticipated assistance start date TBS.  See plan of care for additional detail.  Patient's preferred pharmacy is:  [facility], [place] [address] Phone: [phone] Fax: [facility], [place] - [address] [place] [address] [place] [address] [place] [address] Phone: [phone] Fax: [phone]  Uses pill box? No - N/A Pt endorses 100% compliance  Follow Up:  Patient agrees to Care Plan and Follow-up.  Plan: Telephone follow up appointment with care management team member scheduled for:  03/26/21  Regina Eck, PharmD, BCPS Clinical Pharmacist, [place]  II Phone [phone]

## 2021-03-23 NOTE — Telephone Encounter (Signed)
Spoke to pt.  She informed me that she needs to cancel her procedure for right now.  She is having medical issues.  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  Said it stimulated from shingles a year ago.  Also, having GYN issues stimulating from menopause.  Will call us back when ready to reschedule procedure.  Called Francisco in Endo and informed her by voice mail.

## 2021-03-25 ENCOUNTER — Ambulatory Visit: Payer: Medicare Other | Admitting: Pharmacist

## 2021-03-25 NOTE — Telephone Encounter (Signed)
APPT SET UP-REFER TO CLINICAL PHARM NOTE 6/28

## 2021-03-25 NOTE — Patient Instructions (Signed)
Visit Information  PATIENT GOALS:  Goals Addressed               This Visit's Progress     Patient Stated     T1DM (pt-stated)        Current Barriers:  Unable to independently afford treatment regimen--MEDICARE COVERAGE GAP, HIGH COPAYS Unable to achieve control of T1DM  Suboptimal therapeutic regimen for T1DM   Pharmacist Clinical Goal(s):  Over the next 90 days, patient will verbalize ability to afford treatment regimen achieve control of T1DM as evidenced by GOAL A1C<7%, CONTROLLED BLOOD SUGARS adhere to prescribed medication regimen as evidenced by IMPROVED GLYCEMIC CONTROL through collaboration with PharmD and provider.    Interventions: 1:1 collaboration with Gwenlyn Perking, FNP regarding development and update of comprehensive plan of care as evidenced by provider attestation and co-signature Inter-disciplinary care team collaboration (see longitudinal plan of care) Comprehensive medication review performed; medication list updated in electronic medical record  Diabetes: Uncontrolled; current treatment:BASAGLAR, HUMALOG;  WE DISCONTINUED THE JANUMET DUE TO T1DM DIAGNOSIS Bgs have since gone up--staying in 200s Increase Basaglar to 35 units nightly-patient verbalizes understanding She has been administering additional Humalog at night Humalog sliding scale as follows: Before each meal, 3x daily. For blood sugar of 140-199: 2 units, 200-250: 4 units, 251-299: 8 units, 300-349: 10 units, for 350 or above: 14 units. Current glucose readings: fasting glucose: 200s, post prandial glucose: 200s+ reports hyperglycemic symptoms Discussed meal planning options and Plate method for healthy eating (30-45 cabs per meal, 2x15g snacks) Avoid sugary drinks and desserts Incorporate balanced protein, non starchy veggies, 1 serving of carbohydrate with each meal Increase water intake Increase physical activity as able Current exercise: n/a Educated on diet, medication  adjustments Assessed patient finances. Enrolled patient in [employer] for basaglar and humalog insulin pens--will follow application process  Patient Goals/Self-Care Activities Over the next 90 days, patient will:  - take medications as prescribed check glucose CONTINUOUSLY USING CGM, document, and provide at future appointments engage in dietary modifications by [place] METHOD  Follow Up Plan: Telephone follow up appointment with care management team member scheduled for: 03/26/21          The patient verbalized understanding of instructions, educational materials, and care plan provided today and declined offer to receive copy of patient instructions, educational materials, and care plan.   Telephone follow up appointment with care management team member scheduled for: 03/26/21  Signature Regina Eck, PharmD, BCPS Clinical Pharmacist, [place]  II Phone [phone]

## 2021-03-26 ENCOUNTER — Other Ambulatory Visit: Payer: Self-pay

## 2021-03-26 ENCOUNTER — Ambulatory Visit (INDEPENDENT_AMBULATORY_CARE_PROVIDER_SITE_OTHER): Payer: Medicare Other | Admitting: Pharmacist

## 2021-03-26 DIAGNOSIS — E119 Type 2 diabetes mellitus without complications: Secondary | ICD-10-CM | POA: Diagnosis not present

## 2021-03-26 MED ORDER — METFORMIN HCL ER 500 MG PO TB24: 1000.0000 mg | ORAL_TABLET | Freq: Two times a day (BID) | ORAL | 2 refills | Status: DC

## 2021-03-26 NOTE — Progress Notes (Signed)
Chronic Care Management Pharmacy Note  03/26/2021 Name:  Rebecca Cook MRN:  [id number] DOB:  12/09/1955  Summary: diabetes management  Recommendations/Changes made from today's visit: Diabetes: Uncontrolled; current treatment:BASAGLAR, HUMALOG; add back metformin WE DISCONTINUED JANUMET  Bgs have since gone up--staying in 200s Increase Basaglar to 35 units nightly-patient verbalizes understanding She has been administering additional Humalog at night (STOP) Humalog sliding scale as follows: Before each meal, 3x daily. For blood sugar of 140-199: 2 units, 200-250: 4 units, 251-299: 8 units, 300-349: 10 units, for 350 or above: 14 units. 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 Enrolled patient in [employer] for basaglar and humalog insulin pens--patient approved for basaglar/humalog until 09/25/21-will ship to patient's home  Patient Goals/Self-Care Activities Over the next 90 days, patient will:  - take medications as prescribed check glucose CONTINUOUSLY USING CGM, document, and provide at future appointments engage in dietary modifications by [place]: f/u labs next week  Subjective: Rebecca Cook is an 65 y.o. year old female who is a primary patient of Gwenlyn Perking, FNP.  The CCM team was consulted for assistance with disease management and care coordination needs.    Engaged with patient face to face for follow up visit in response to provider referral for pharmacy case management and/or care coordination services.   Consent to Services:  The  patient was given information about Chronic Care Management services, agreed to services, and gave verbal consent prior to initiation of services.  Please see initial visit note for detailed documentation.   Patient Care Team: Gwenlyn Perking, FNP as PCP - General (Family Medicine) Gala Romney Cristopher Estimable, MD as Consulting Physician (Gastroenterology) Lavera Guise, [facility] as Pharmacist (Family Medicine)   Objective:  Lab Results  Component Value Date   CREATININE 0.75 01/06/2021   CREATININE 0.63 03/02/2020   CREATININE 0.48 02/19/2020    Lab Results  Component Value Date   HGBA1C 8.5 (H) 01/06/2021   Last diabetic Eye exam: No results found for: HMDIABEYEEXA  Last diabetic Foot exam: No results found for: HMDIABFOOTEX      Component Value Date/Time   CHOL 235 (H) 01/06/2021 1114   TRIG 254 (H) 01/06/2021 1114   HDL 39 (L) 01/06/2021 1114   CHOLHDL 6.0 (H) 01/06/2021 1114   CHOLHDL 6 10/17/2017 1534   VLDL 67.0 (H) 10/17/2017 1534   LDLCALC 149 (H) 01/06/2021 1114   LDLDIRECT 195.0 10/17/2017 1534    Hepatic Function Latest Ref Rng & Units 01/06/2021 03/02/2020 02/18/2020  Total Protein 6.0 - 8.5 g/dL 7.2 7.0 5.6(L)  Albumin 3.8 - 4.8 g/dL 4.2 4.0 2.7(L)  AST 0 - 40 IU/L $Remov'15 20 24  'xfAmQQ$ ALT 0 - 32 IU/L $Remov'16 23 25  'XGubVE$ Alk Phosphatase 44 - 121 IU/L 80 69 64  Total Bilirubin 0.0 - 1.2 mg/dL 0.4 0.7 1.1  Bilirubin, Direct 0.0 - 0.3 mg/dL - - -    Lab Results  Component Value Date/Time   TSH 2.11 10/17/2017 03:34 PM   TSH 1.94 09/11/2014 07:44 AM    CBC Latest Ref Rng & Units 01/06/2021 03/02/2020 02/18/2020  WBC 3.4 - 10.8 x10E3/uL 8.1 7.1  8.3  Hemoglobin 11.1 - 15.9 g/dL 15.1 15.0 13.9  Hematocrit 34.0 - 46.6 % 45.4 44.8 41.1  Platelets 150 - 450 x10E3/uL 251 245.0 166    No results found for: VD25OH  Clinical ASCVD: No  The 10-year ASCVD risk score Mikey Bussing DC Jr., et al., 2013) is: 16.9%   Values used to calculate the score:     Age: 77 years     Sex: Female     Is Non-Hispanic  African American: No     Diabetic: Yes     Tobacco smoker: No     Systolic Blood Pressure: 982 mmHg     Is BP treated: Yes     HDL Cholesterol: 39 mg/dL     Total Cholesterol: 235 mg/dL    Other: (CHADS2VASc if Afib, PHQ9 if depression, MMRC or CAT for COPD, ACT, DEXA)  Social History   Tobacco Use  Smoking Status Former   Pack years: 0.00  Smokeless Tobacco Never   BP Readings from Last 3 Encounters:  01/06/21 121/76  08/17/20 128/82  07/01/20 (!) 142/88   Pulse Readings from Last 3 Encounters:  08/17/20 89  07/01/20 91  05/12/20 (!) 104   Wt Readings from Last 3 Encounters:  02/10/21 196 lb 6.4 oz (89.1 kg)  01/06/21 199 lb 6.4 oz (90.4 kg)  08/17/20 194 lb 14.4 oz (88.4 kg)    Assessment: Review of patient past medical history, allergies, medications, health status, including review of consultants reports, laboratory and other test data, was performed as part of comprehensive evaluation and provision of chronic care management services.   SDOH:  (Social Determinants of Health) assessments and interventions performed:    CCM Care Plan  No Known Allergies  Medications Reviewed Today     Reviewed by Lavera Guise, [facility] (Pharmacist) on 03/31/21 at 1110  Med List Status: <None>   Medication Order Taking? Sig Documenting Provider Last Dose Status Informant  cetirizine (ZYRTEC) 10 MG tablet [id number]  Take 1 tablet (10 mg total) by mouth daily. Gwenlyn Perking, FNP  Active   estradiol (ESTRACE VAGINAL) 0.1 MG/GM vaginal cream [id number] No Place 1 Applicatorful vaginally 3 (three) times a week. Gwenlyn Perking, FNP Taking Active   gabapentin (NEURONTIN) 300 MG capsule [id number] No Take 1 capsule (300 mg total) by mouth 3 (three) times daily. Gwenlyn Perking, FNP Taking Active   Insulin Glargine [facility] KWIKPEN) 100 UNIT/ML [id number] No Inject 32 Units into the skin at bedtime.  Patient taking differently: Inject 35 Units into the skin at bedtime.   Gwenlyn Perking, FNP Taking Active            Med Note Parthenia Ames Mar 25, 2021  9:23 AM) Gets via [employer] patient assistance   insulin lispro (HUMALOG KWIKPEN) 100 UNIT/ML KwikPen [id number] No Before each meal, 3x daily. For blood sugar of 140-199: 2 units, 200-250: 4 units, 251-299: 8 units, 300-349: 10 units, for 350 or above: 14 units. Gwenlyn Perking, FNP Taking Active            Med Note Parthenia Ames Mar 25, 2021  9:23 AM) Marguerite Olea via [employer] patient assistance  losartan-hydrochlorothiazide (HYZAAR) 100-25 MG tablet [id number] No Take 1 tablet by mouth daily. Gwenlyn Perking, FNP Taking Active   metFORMIN (GLUCOPHAGE-XR) 500 MG 24 hr tablet [id number] Yes Take 2 tablets (1,000 mg total) by mouth 2 (two) times daily with a meal. Lilia Pro,  Arman Bogus, FNP  Active   Multiple Vitamins-Minerals (MULTIVITAMIN PO) [id number] No Take 1 tablet by mouth daily.  [provider] Taking Active Pharmacy Records  rosuvastatin (CRESTOR) 10 MG tablet [id number] No Take 1 tablet (10 mg total) by mouth daily. Gwenlyn Perking, FNP Taking Active   UNABLE TO FIND [id number] No Collagen Peptide - 1 scoop qd [provider] Taking Active   Med List Note Carie Caddy 02/16/20 1543): Daughter Dancy: [phone]            Patient Active Problem List   Diagnosis Date Noted   Neuropathy 01/06/2021   Uterine leiomyoma 06/18/2020   DKA, type 2 ([place]) 02/16/2020   Hyperkalemia 02/16/2020   Hypercalcemia 39/76/7341   Acute metabolic encephalopathy 93/79/0240   AKI (acute kidney injury) ([place]) 02/16/2020   Shingles of eyelid 02/16/2020   Hypertensive urgency 02/16/2020   Hidradenitis axillaris 08/31/2016   Visit for screening mammogram 08/31/2016   Impingement syndrome of right shoulder 08/12/2016   Anxiety and depression 11/19/2015   Hyperlipidemia 09/02/2014   Uncontrolled diabetes mellitus ([place]) 11/22/2013   HTN (hypertension) 11/22/2013     Immunization History  Administered Date(s) Administered   Influenza,inj,Quad PF,6+ Mos 10/17/2017, 06/05/2019   Influenza-Unspecified 07/21/2014   Moderna Sars-Covid-2 Vaccination 12/09/2019, 12/29/2019, 08/29/2020   Pneumococcal Conjugate-13 03/16/2020   Tdap 03/16/2020    Conditions to be addressed/monitored: DMII  Care Plan : PHARMD MEDICATION MANAGEMENT  Updates made by Lavera Guise, [place] since 03/31/2021 12:00 AM     Problem: DISEASE PROGRESSION PREVENTION      Long-Range Goal: T1DM   Recent Progress: Not on track  Priority: High  Note:   Current Barriers:  Unable to independently afford treatment regimen--MEDICARE COVERAGE GAP, HIGH COPAYS Unable to achieve control of T1DM  Suboptimal therapeutic regimen for T1DM   Pharmacist Clinical Goal(s):  Over the next 90 days, patient will verbalize ability to afford treatment regimen achieve control of T1DM as evidenced by GOAL A1C<7%, CONTROLLED BLOOD SUGARS adhere to prescribed medication regimen as evidenced by IMPROVED GLYCEMIC CONTROL  through collaboration with PharmD and provider.    Interventions: 1:1 collaboration with Gwenlyn Perking, FNP regarding development and update of comprehensive plan of care as evidenced by provider attestation and co-signature Inter-disciplinary care team collaboration (see longitudinal plan of care) Comprehensive medication review performed; medication list updated in electronic medical record  Diabetes: Uncontrolled; current treatment:BASAGLAR, HUMALOG; add back metformin WE DISCONTINUED THE JANUMET  Bgs have since gone up--staying in 200s Increase Basaglar to 35 units nightly-patient verbalizes understanding She has been administering additional Humalog at night Humalog sliding scale as follows: Before each meal, 3x daily. For blood sugar of 140-199: 2 units, 200-250: 4 units, 251-299: 8 units, 300-349: 10 units, for 350 or above: 14 units. Ordered C-peptide, consider other  antibodies as well, will discuss with endocrine Current glucose readings: fasting glucose: 200s, post prandial glucose: 200s+ reports hyperglycemic symptoms Discussed meal planning options and Plate method for healthy eating (30-45 cabs per meal, 2x15g snacks) Avoid sugary drinks and desserts Incorporate balanced protein, non starchy veggies, 1 serving of carbohydrate with each meal Increase water intake Increase physical activity as able Current exercise: n/a Educated on diet, medication adjustments Assessed patient finances. Enrolled patient in [employer] for basaglar and humalog insulin pens--patient approved for basaglar/humalog until 09/25/21-will ship to patient's home  Patient Goals/Self-Care Activities Over the next 90 days, patient will:  - take medications as prescribed check glucose CONTINUOUSLY USING CGM, document,  and provide at future appointments engage in dietary modifications by CONTINUING TO FOLLOW HEALTHY PLATE METHOD  Follow Up Plan: Telephone follow up appointment with care management team member scheduled for: 03/26/21      Medication Assistance:  basaglar/humalog obtained through [employer] medication assistance program.  Enrollment ends 09/25/21  Patient's preferred pharmacy is:  [place], [place] [address] Phone: [phone] Fax: [place], [place] - [address] [place] [address] [place] [address] Phone: [phone] Fax: [phone]   Follow Up:  Patient agrees to Care Plan and Follow-up.  Plan: Telephone follow up appointment with care management team member scheduled for:  next week  Regina Eck, PharmD, BCPS Clinical Pharmacist, [place]  II Phone [phone]

## 2021-03-27 LAB — GLUCOSE, RANDOM: Glucose: 191 mg/dL — ABNORMAL HIGH (ref 65–99)

## 2021-03-27 LAB — C-PEPTIDE: C-Peptide: 3 ng/mL (ref 1.1–4.4)

## 2021-03-30 ENCOUNTER — Ambulatory Visit: Payer: Medicare Other | Admitting: Pharmacist

## 2021-03-30 DIAGNOSIS — E119 Type 2 diabetes mellitus without complications: Secondary | ICD-10-CM | POA: Diagnosis not present

## 2021-03-30 NOTE — Progress Notes (Signed)
Chronic Care Management Pharmacy Note  03/30/2021 Name:  Rebecca Cook MRN:  [id number] DOB:  1956/09/11  Summary: diabetes management  Recommendations/Changes made from today's visit: Diabetes: Uncontrolled; current treatment:BASAGLAR, HUMALOG; add back metformin WE DISCONTINUED THE JANUMET  Bgs have since gone up--staying in 200s Increase Basaglar to 35 units nightly-patient verbalizes understanding She has been administering additional Humalog at night Humalog sliding scale as follows: Before each meal, 3x daily. For blood sugar of 140-199: 2 units, 200-250: 4 units, 251-299: 8 units, 300-349: 10 units, for 350 or above: 14 units. Ordered C-peptide- level 3.0 post prandial-indicative of T2DM Endocrine recommends labs to see if T1 Vs T2-->ZnT8, GAD, insulin antibodies--will see if insurance will allow, can be costly Can trial GLP1 if unable to get additional labs Educated on diet, medication adjustments Assessed patient finances. Enrolled patient in [employer] for basaglar and humalog insulin pens--patient approved for basaglar/humalog until 09/25/21-will ship to patient's home  Plan: f/u next week--trial GLP1   Subjective: Rebecca Cook is an 65 y.o. year old female who is a primary patient of Gwenlyn Perking, FNP.  The CCM team was consulted for assistance with disease management and care coordination needs.    Engaged with patient by telephone for follow up visit in response to provider referral for pharmacy case management and/or care coordination services.   Consent to Services:  The patient was given information about Chronic Care Management services, agreed to services, and gave verbal consent prior to initiation of services.  Please see initial visit note for detailed documentation.   Patient Care Team: Gwenlyn Perking, FNP as PCP - General (Family Medicine) Gala Romney Cristopher Estimable, MD as Consulting Physician (Gastroenterology) Lavera Guise, [facility] as  Pharmacist (Family Medicine)  Objective:  Lab Results  Component Value Date   CREATININE 0.75 01/06/2021   CREATININE 0.63 03/02/2020   CREATININE 0.48 02/19/2020    Lab Results  Component Value Date   HGBA1C 8.5 (H) 01/06/2021   Last diabetic Eye exam: No results found for: HMDIABEYEEXA  Last diabetic Foot exam: No results found for: HMDIABFOOTEX      Component Value Date/Time   CHOL 235 (H) 01/06/2021 1114   TRIG 254 (H) 01/06/2021 1114   HDL 39 (L) 01/06/2021 1114   CHOLHDL 6.0 (H) 01/06/2021 1114   CHOLHDL 6 10/17/2017 1534   VLDL 67.0 (H) 10/17/2017 1534   LDLCALC 149 (H) 01/06/2021 1114   LDLDIRECT 195.0 10/17/2017 1534    Hepatic Function Latest Ref Rng & Units 01/06/2021 03/02/2020 02/18/2020  Total Protein 6.0 - 8.5 g/dL 7.2 7.0 5.6(L)  Albumin 3.8 - 4.8 g/dL 4.2 4.0 2.7(L)  AST 0 - 40 IU/L $Remov'15 20 24  'QAxIyR$ ALT 0 - 32 IU/L $Remov'16 23 25  'HnWBpR$ Alk Phosphatase 44 - 121 IU/L 80 69 64  Total Bilirubin 0.0 - 1.2 mg/dL 0.4 0.7 1.1  Bilirubin, Direct 0.0 - 0.3 mg/dL - - -    Lab Results  Component Value Date/Time   TSH 2.11 10/17/2017 03:34 PM   TSH 1.94 09/11/2014 07:44 AM    CBC Latest Ref Rng & Units 01/06/2021 03/02/2020 02/18/2020  WBC 3.4 - 10.8 x10E3/uL 8.1 7.1 8.3  Hemoglobin 11.1 - 15.9 g/dL 15.1 15.0 13.9  Hematocrit 34.0 - 46.6 % 45.4 44.8 41.1  Platelets 150 - 450 x10E3/uL 251 245.0 166    No results found for: VD25OH  Clinical ASCVD: No  The 10-year ASCVD risk score Mikey Bussing DC Jr., et al., 2013) is:  16.9%   Values used to calculate the score:     Age: 62 years     Sex: Female     Is Non-Hispanic African American: No     Diabetic: Yes     Tobacco smoker: No     Systolic Blood Pressure: 828 mmHg     Is BP treated: Yes     HDL Cholesterol: 39 mg/dL     Total Cholesterol: 235 mg/dL    Other: (CHADS2VASc if Afib, PHQ9 if depression, MMRC or CAT for COPD, ACT, DEXA)  Social History   Tobacco Use  Smoking Status Former   Pack years: 0.00  Smokeless Tobacco  Never   BP Readings from Last 3 Encounters:  01/06/21 121/76  08/17/20 128/82  07/01/20 (!) 142/88   Pulse Readings from Last 3 Encounters:  08/17/20 89  07/01/20 91  05/12/20 (!) 104   Wt Readings from Last 3 Encounters:  02/10/21 196 lb 6.4 oz (89.1 kg)  01/06/21 199 lb 6.4 oz (90.4 kg)  08/17/20 194 lb 14.4 oz (88.4 kg)    Assessment: Review of patient past medical history, allergies, medications, health status, including review of consultants reports, laboratory and other test data, was performed as part of comprehensive evaluation and provision of chronic care management services.   SDOH:  (Social Determinants of Health) assessments and interventions performed:    CCM Care Plan  No Known Allergies  Medications Reviewed Today     Reviewed by Lavera Guise, [facility] (Pharmacist) on 03/31/21 at 1110  Med List Status: <None>   Medication Order Taking? Sig Documenting Provider Last Dose Status Informant  cetirizine (ZYRTEC) 10 MG tablet [id number]  Take 1 tablet (10 mg total) by mouth daily. Gwenlyn Perking, FNP  Active   estradiol (ESTRACE VAGINAL) 0.1 MG/GM vaginal cream [id number] No Place 1 Applicatorful vaginally 3 (three) times a week. Gwenlyn Perking, FNP Taking Active   gabapentin (NEURONTIN) 300 MG capsule [id number] No Take 1 capsule (300 mg total) by mouth 3 (three) times daily. Gwenlyn Perking, FNP Taking Active   Insulin Glargine [facility] KWIKPEN) 100 UNIT/ML [id number] No Inject 32 Units into the skin at bedtime.  Patient taking differently: Inject 35 Units into the skin at bedtime.   Gwenlyn Perking, FNP Taking Active            Med Note Parthenia Ames Mar 25, 2021  9:23 AM) Gets via [employer] patient assistance   insulin lispro (HUMALOG KWIKPEN) 100 UNIT/ML KwikPen [id number] No Before each meal, 3x daily. For blood sugar of 140-199: 2 units, 200-250: 4 units, 251-299: 8 units, 300-349: 10 units, for 350 or above: 14 units. Gwenlyn Perking, FNP  Taking Active            Med Note Parthenia Ames Mar 25, 2021  9:23 AM) Marguerite Olea via [employer] patient assistance  losartan-hydrochlorothiazide (HYZAAR) 100-25 MG tablet [id number] No Take 1 tablet by mouth daily. Gwenlyn Perking, FNP Taking Active   metFORMIN (GLUCOPHAGE-XR) 500 MG 24 hr tablet [id number] Yes Take 2 tablets (1,000 mg total) by mouth 2 (two) times daily with a meal. Gwenlyn Perking, FNP  Active   Multiple Vitamins-Minerals (MULTIVITAMIN PO) [id number] No Take 1 tablet by mouth daily.  [provider] Taking Active Pharmacy Records  rosuvastatin (CRESTOR) 10 MG tablet [id number] No Take 1 tablet (10 mg total) by mouth daily. Gwenlyn Perking, FNP Taking Active   UNABLE  TO FIND [id number] No Collagen Peptide - 1 scoop qd [provider] Taking Active   Med List Note Elyn Peers, CPhT 02/16/20 1543): Daughter Dancy: [phone]            Patient Active Problem List   Diagnosis Date Noted   Neuropathy 01/06/2021   Uterine leiomyoma 06/18/2020   DKA, type 2 ([place]) 02/16/2020   Hyperkalemia 02/16/2020   Hypercalcemia 17/40/8144   Acute metabolic encephalopathy 81/85/6314   AKI (acute kidney injury) ([place]) 02/16/2020   Shingles of eyelid 02/16/2020   Hypertensive urgency 02/16/2020   Hidradenitis axillaris 08/31/2016   Visit for screening mammogram 08/31/2016   Impingement syndrome of right shoulder 08/12/2016   Anxiety and depression 11/19/2015   Hyperlipidemia 09/02/2014   Uncontrolled diabetes mellitus ([place]) 11/22/2013   HTN (hypertension) 11/22/2013    Immunization History  Administered Date(s) Administered   Influenza,inj,Quad PF,6+ Mos 10/17/2017, 06/05/2019   Influenza-Unspecified 07/21/2014   Moderna Sars-Covid-2 Vaccination 12/09/2019, 12/29/2019, 08/29/2020   Pneumococcal Conjugate-13 03/16/2020   Tdap 03/16/2020    Conditions to be addressed/monitored: HTN, HLD, and DMII  Care Plan : PHARMD MEDICATION MANAGEMENT   Updates made by Lavera Guise, [place] since 03/31/2021 12:00 AM     Problem: DISEASE PROGRESSION PREVENTION      Long-Range Goal: T1DM   Recent Progress: Not on track  Priority: High  Note:   Current Barriers:  Unable to independently afford treatment regimen--MEDICARE COVERAGE GAP, HIGH COPAYS Unable to achieve control of T1DM  Suboptimal therapeutic regimen for T1DM   Pharmacist Clinical Goal(s):  Over the next 90 days, patient will verbalize ability to afford treatment regimen achieve control of T1DM as evidenced by GOAL A1C<7%, CONTROLLED BLOOD SUGARS adhere to prescribed medication regimen as evidenced by IMPROVED GLYCEMIC CONTROL  through collaboration with PharmD and provider.    Interventions: 1:1 collaboration with Gwenlyn Perking, FNP regarding development and update of comprehensive plan of care as evidenced by provider attestation and co-signature Inter-disciplinary care team collaboration (see longitudinal plan of care) Comprehensive medication review performed; medication list updated in electronic medical record  Diabetes: Uncontrolled; current treatment:BASAGLAR, HUMALOG; add back metformin WE DISCONTINUED THE JANUMET  Bgs have since gone up--staying in 200s Increase Basaglar to 35 units nightly-patient verbalizes understanding She has been administering additional Humalog at night Humalog sliding scale as follows: Before each meal, 3x daily. For blood sugar of 140-199: 2 units, 200-250: 4 units, 251-299: 8 units, 300-349: 10 units, for 350 or above: 14 units. Ordered C-peptide- level 3.0 post prandial-indicative of T2DM Endocrine recommends labs to see if T1 Vs T2-->ZnT8, GAD, insulin antibodies--will see if insurance will allow, can be costly Can trial GLP1 if unable to get additional labs Current glucose readings: fasting glucose: 200s, post prandial glucose: 200s+ reports hyperglycemic symptoms Discussed meal planning options and Plate method for healthy  eating (30-45 cabs per meal, 2x15g snacks) Avoid sugary drinks and desserts Incorporate balanced protein, non starchy veggies, 1 serving of carbohydrate with each meal Increase water intake Increase physical activity as able Current exercise: n/a Educated on diet, medication adjustments Assessed patient finances. Enrolled patient in [employer] for basaglar and humalog insulin pens--patient approved for basaglar/humalog until 09/25/21-will ship to patient's home  Patient Goals/Self-Care Activities Over the next 90 days, patient will:  - take medications as prescribed check glucose CONTINUOUSLY USING CGM, document, and provide at future appointments engage in dietary modifications by [place] METHOD  Follow Up Plan: Telephone follow up appointment  with care management team member scheduled for: 1 month     Medication Assistance:  basaglar/humalog obtained through [employer] medication assistance program.  Enrollment ends 09/25/21  Patient's preferred pharmacy is:  [place], [place] [address] Phone: [phone] Fax: [place], [place] - [address] [place] [address] [place] [address] Phone: [phone] Fax: [phone]   Follow Up:  Patient agrees to Care Plan and Follow-up.  Plan: Telephone follow up appointment with care management team member scheduled for:  next week  Regina Eck, PharmD, BCPS Clinical Pharmacist, [place]  II Phone [phone]

## 2021-03-31 ENCOUNTER — Encounter: Payer: Self-pay | Admitting: Family Medicine

## 2021-03-31 NOTE — Patient Instructions (Signed)
Visit Information  PATIENT GOALS:  Goals Addressed               This Visit's Progress     Patient Stated     T1DM (pt-stated)        Current Barriers:  Unable to independently afford treatment regimen--MEDICARE COVERAGE GAP, HIGH COPAYS Unable to achieve control of T1DM  Suboptimal therapeutic regimen for T1DM   Pharmacist Clinical Goal(s):  Over the next 90 days, patient will verbalize ability to afford treatment regimen achieve control of T1DM as evidenced by GOAL A1C<7%, CONTROLLED BLOOD SUGARS adhere to prescribed medication regimen as evidenced by IMPROVED GLYCEMIC CONTROL through collaboration with PharmD and provider.    Interventions: 1:1 collaboration with Gwenlyn Perking, FNP regarding development and update of comprehensive plan of care as evidenced by provider attestation and co-signature Inter-disciplinary care team collaboration (see longitudinal plan of care) Comprehensive medication review performed; medication list updated in electronic medical record  Diabetes: Uncontrolled; current treatment:BASAGLAR, HUMALOG; add back metformin WE DISCONTINUED THE JANUMET  Bgs have since gone up--staying in 200s Increase Basaglar to 35 units nightly-patient verbalizes understanding She has been administering additional Humalog at night Humalog sliding scale as follows: Before each meal, 3x daily. For blood sugar of 140-199: 2 units, 200-250: 4 units, 251-299: 8 units, 300-349: 10 units, for 350 or above: 14 units. Ordered C-peptide, consider other antibodies as well, will discuss with endocrine Current glucose readings: fasting glucose: 200s, post prandial glucose: 200s+ reports hyperglycemic symptoms Discussed meal planning options and Plate method for healthy eating (30-45 cabs per meal, 2x15g snacks) Avoid sugary drinks and desserts Incorporate balanced protein, non starchy veggies, 1 serving of carbohydrate with each meal Increase water intake Increase physical  activity as able Current exercise: n/a Educated on diet, medication adjustments Assessed patient finances. Enrolled patient in [employer] for basaglar and humalog insulin pens--patient approved for basaglar/humalog until 09/25/21-will ship to patient's home  Patient Goals/Self-Care Activities Over the next 90 days, patient will:  - take medications as prescribed check glucose CONTINUOUSLY USING CGM, document, and provide at future appointments engage in dietary modifications by [place] METHOD  Follow Up Plan: Telephone follow up appointment with care management team member scheduled for: 03/26/21          The patient verbalized understanding of instructions, educational materials, and care plan provided today and declined offer to receive copy of patient instructions, educational materials, and care plan.   Telephone follow up appointment with care management team member scheduled for: next week  Signature Regina Eck, PharmD, BCPS Clinical Pharmacist, [place]  II Phone [phone]

## 2021-04-01 ENCOUNTER — Telehealth: Payer: Self-pay | Admitting: Family Medicine

## 2021-04-02 ENCOUNTER — Encounter (HOSPITAL_COMMUNITY): Payer: Self-pay

## 2021-04-02 ENCOUNTER — Ambulatory Visit (HOSPITAL_COMMUNITY): Admit: 2021-04-02 | Payer: Medicare Other

## 2021-04-02 DIAGNOSIS — H25813 Combined forms of age-related cataract, bilateral: Secondary | ICD-10-CM | POA: Diagnosis not present

## 2021-04-02 SURGERY — COLONOSCOPY WITH PROPOFOL
Anesthesia: Monitor Anesthesia Care

## 2021-04-02 NOTE — Patient Instructions (Signed)
Visit Information  PATIENT GOALS:  Goals Addressed               This Visit's Progress     Patient Stated     T1DM (pt-stated)        Current Barriers:  Unable to independently afford treatment regimen--MEDICARE COVERAGE GAP, HIGH COPAYS Unable to achieve control of T1DM  Suboptimal therapeutic regimen for T1DM   Pharmacist Clinical Goal(s):  Over the next 90 days, patient will verbalize ability to afford treatment regimen achieve control of T1DM as evidenced by GOAL A1C<7%, CONTROLLED BLOOD SUGARS adhere to prescribed medication regimen as evidenced by IMPROVED GLYCEMIC CONTROL through collaboration with PharmD and provider.    Interventions: 1:1 collaboration with Gwenlyn Perking, FNP regarding development and update of comprehensive plan of care as evidenced by provider attestation and co-signature Inter-disciplinary care team collaboration (see longitudinal plan of care) Comprehensive medication review performed; medication list updated in electronic medical record  Diabetes: Uncontrolled; current treatment:BASAGLAR, HUMALOG; add back metformin WE DISCONTINUED THE JANUMET  Bgs have since gone up--staying in 200s Increase Basaglar to 35 units nightly-patient verbalizes understanding She has been administering additional Humalog at night Humalog sliding scale as follows: Before each meal, 3x daily. For blood sugar of 140-199: 2 units, 200-250: 4 units, 251-299: 8 units, 300-349: 10 units, for 350 or above: 14 units. Ordered C-peptide- level 3.0 post prandial-indicative of T2DM Endocrine recommends labs to see if T1 Vs T2-->ZnT8, GAD, insulin antibodies--will see if insurance will allow, can be costly Can trial GLP1 if unable to get additional labs Current glucose readings: fasting glucose: 200s, post prandial glucose: 200s+ reports hyperglycemic symptoms Discussed meal planning options and Plate method for healthy eating (30-45 cabs per meal, 2x15g snacks) Avoid sugary  drinks and desserts Incorporate balanced protein, non starchy veggies, 1 serving of carbohydrate with each meal Increase water intake Increase physical activity as able Current exercise: n/a Educated on diet, medication adjustments Assessed patient finances. Enrolled patient in [employer] for basaglar and humalog insulin pens--patient approved for basaglar/humalog until 09/25/21-will ship to patient's home  Patient Goals/Self-Care Activities Over the next 90 days, patient will:  - take medications as prescribed check glucose CONTINUOUSLY USING CGM, document, and provide at future appointments engage in dietary modifications by [place] METHOD  Follow Up Plan: Telephone follow up appointment with care management team member scheduled for: 1 month          The patient verbalized understanding of instructions, educational materials, and care plan provided today and declined offer to receive copy of patient instructions, educational materials, and care plan.   Face to Face appointment with care management team member scheduled for: next wednesday  Signature Regina Eck, PharmD, BCPS Clinical Pharmacist, [place]  II Phone [phone]

## 2021-04-03 ENCOUNTER — Other Ambulatory Visit: Payer: Self-pay | Admitting: Family Medicine

## 2021-04-06 DIAGNOSIS — H25813 Combined forms of age-related cataract, bilateral: Secondary | ICD-10-CM | POA: Diagnosis not present

## 2021-04-06 DIAGNOSIS — H25812 Combined forms of age-related cataract, left eye: Secondary | ICD-10-CM | POA: Diagnosis not present

## 2021-04-06 DIAGNOSIS — E669 Obesity, unspecified: Secondary | ICD-10-CM | POA: Diagnosis not present

## 2021-04-06 DIAGNOSIS — I10 Essential (primary) hypertension: Secondary | ICD-10-CM | POA: Diagnosis not present

## 2021-04-06 DIAGNOSIS — E1136 Type 2 diabetes mellitus with diabetic cataract: Secondary | ICD-10-CM | POA: Diagnosis not present

## 2021-04-06 DIAGNOSIS — F419 Anxiety disorder, unspecified: Secondary | ICD-10-CM | POA: Diagnosis not present

## 2021-04-06 NOTE — Telephone Encounter (Signed)
Returned call

## 2021-04-07 ENCOUNTER — Ambulatory Visit: Payer: Medicare Other | Admitting: Pharmacist

## 2021-04-07 ENCOUNTER — Other Ambulatory Visit: Payer: Self-pay

## 2021-04-07 DIAGNOSIS — E119 Type 2 diabetes mellitus without complications: Secondary | ICD-10-CM

## 2021-04-07 NOTE — Progress Notes (Signed)
Chronic Care Management Pharmacy Note  04/07/2021 Name:  Rebecca Cook MRN:  [id number] DOB:  01-08-1956  Summary: diabetes management  Recommendations/Changes made from today's visit:  Interventions: 1:1 collaboration with Gwenlyn Perking, FNP regarding development and update of comprehensive plan of care as evidenced by provider attestation and co-signature Inter-disciplinary care team collaboration (see longitudinal plan of care) Comprehensive medication review performed; medication list updated in electronic medical record  Diabetes: Uncontrolled (improving); current treatment:BASAGLAR, HUMALOG; metformin BGs are trending down, but still elevated for patient's goal Patient uses dexcom for CGM START trulicity 8.93YB sq weekly Will enroll in [employer] patient assistance Denies personal and family history of Medullary thyroid cancer (MTC) Titrate to 1.74m sq weekly in 2-4 weeks pending patient response CONTINUE Basaglar 35 units night CONTINUE Metformin CONTINUE Humalog sliding scale as follows: Before each meal, 3x daily. For blood sugar of 140-199: 2 units, 200-250: 4 units, 251-299: 8 units, 300-349: 10 units, for 350 or above: 14 units. She has NOT been administering additional Humalog at night (was previously requiring this dose) C-peptide- level 3.0 post prandial-indicative of T2DM (BUT NOT A DEFINITE) Endocrine recommends labs to see if T1 Vs T2-->ZnT8, GAD, insulin antibodies--NOT COVERED ON INSURANCE, WILL POSTPONE THESE LABS AND ASSESS RESPONSE FROM GLP1 THERAPY Current glucose readings: fasting glucose: 200s, post prandial glucose: 200s+ reports hyperglycemic symptoms Discussed meal planning options and Plate method for healthy eating (30-45 cabs per meal, 2x15g snacks) Current exercise: n/a Educated on diet, medication adjustments Assessed patient finances. Enrolled patient in [employer] for basaglar and humalog insulin pens--patient approved for  basaglar/humalog until 09/25/21-will ship to patient's home; will enroll in patient assistance for trulicity   Plan: f/u in 2 weeks  Subjective: Rebecca STEEDMANis an 65y.o. year old female who is a primary patient of MGwenlyn Perking FNP.  The CCM team was consulted for assistance with disease management and care coordination needs.    Engaged with patient face to face for follow up visit in response to provider referral for pharmacy case management and/or care coordination services.   Consent to Services:  The patient was given the following information about Chronic Care Management services today, agreed to services, and gave verbal consent: 1. CCM service includes personalized support from designated clinical staff supervised by the primary care provider, including individualized plan of care and coordination with other care providers 2. 24/7 contact phone numbers for assistance for urgent and routine care needs. 3. Service will only be billed when office clinical staff spend 20 minutes or more in a month to coordinate care. 4. Only one practitioner may furnish and bill the service in a calendar month. 5.The patient may stop CCM services at any time (effective at the end of the month) by phone call to the office staff. 6. The patient will be responsible for cost sharing (co-pay) of up to 20% of the service fee (after annual deductible is met). Patient agreed to services and consent obtained.  Patient Care Team: MGwenlyn Perking FNP as PCP - General (Family Medicine) RGala RomneyRCristopher Estimable MD as Consulting Physician (Gastroenterology) PLavera Guise [facility] Hospitalas Pharmacist (Family Medicine)  Recent office visits: This week PCP  [facility]  Objective:  Lab Results  Component Value Date   CREATININE 0.69 04/08/2021   CREATININE 0.75 01/06/2021   CREATININE 0.63 03/02/2020    Lab Results  Component Value Date   HGBA1C  7.4 (H)  04/08/2021   Last diabetic Eye exam: No results found for: HMDIABEYEEXA  Last diabetic Foot exam: No results found for: HMDIABFOOTEX      Component Value Date/Time   CHOL 145 04/08/2021 1129   TRIG 144 04/08/2021 1129   HDL 41 04/08/2021 1129   CHOLHDL 3.5 04/08/2021 1129   CHOLHDL 6 10/17/2017 1534   VLDL 67.0 (H) 10/17/2017 1534   LDLCALC 79 04/08/2021 1129   LDLDIRECT 195.0 10/17/2017 1534    Hepatic Function Latest Ref Rng & Units 04/08/2021 01/06/2021 03/02/2020  Total Protein 6.0 - 8.5 g/dL 7.0 7.2 7.0  Albumin 3.8 - 4.8 g/dL 4.3 4.2 4.0  AST 0 - 40 IU/L _0 ALT 0 - 32 IU/L _1 Alk Phosphatase 44 - 121 IU/L 80 80 69  Total Bilirubin 0.0 - 1.2 mg/dL 0.4 0.4 0.7  Bilirubin, Direct 0.0 - 0.3 mg/dL - - -    Lab Results  Component Value Date/Time   TSH 2.11 10/17/2017 03:34 PM   TSH 1.94 09/11/2014 07:44 AM    CBC Latest Ref Rng & Units 04/08/2021 01/06/2021 03/02/2020  WBC 3.4 - 10.8 x10E3/uL 9.0 8.1 7.1  Hemoglobin 11.1 - 15.9 g/dL 14.3 15.1 15.0  Hematocrit 34.0 - 46.6 % 43.5 45.4 44.8  Platelets 150 - 450 x10E3/uL 257 251 245.0    No results found for: VD25OH  Clinical ASCVD: No  The 10-year ASCVD risk score Mikey Bussing DC Jr., et al., 2013) is: 17.9%   Values used to calculate the score:     Age: 65 years     Sex: Female     Is Non-Hispanic African American: No     Diabetic: Yes     Tobacco smoker: No     Systolic Blood Pressure: 710 mmHg     Is BP treated: Yes     HDL Cholesterol: 41 mg/dL     Total Cholesterol: 145 mg/dL    Other: (CHADS2VASc if Afib, PHQ9 if depression, MMRC or CAT for COPD, ACT, DEXA)  Social History   Tobacco Use  Smoking Status Former  Smokeless Tobacco Never   BP Readings from Last 3 Encounters:  04/08/21 (!) 142/80  01/06/21 121/76  08/17/20 128/82   Pulse Readings from Last 3 Encounters:  04/08/21 88  08/17/20 89  07/01/20 91   Wt Readings from Last 3 Encounters:  04/08/21 208 lb (94.3 kg)  02/10/21 196 lb 6.4 oz  (89.1 kg)  01/06/21 199 lb 6.4 oz (90.4 kg)    Assessment: Review of patient past medical history, allergies, medications, health status, including review of consultants reports, laboratory and other test data, was performed as part of comprehensive evaluation and provision of chronic care management services.   SDOH:  (Social Determinants of Health) assessments and interventions performed:    CCM Care Plan  No Known Allergies  Medications Reviewed Today     Reviewed by Gwenlyn Perking, FNP (Family Nurse Practitioner) on 04/08/21 at 1250  Med List Status: <None>   Medication Order Taking? Sig Documenting Provider Last Dose Status Informant  cetirizine (ZYRTEC) 10 MG tablet [id number] Yes Take 1 tablet (10 mg total) by mouth daily. Gwenlyn Perking, FNP Taking Active   estradiol [facility] VAGINAL) 0.1 MG/GM vaginal cream [id number] Yes Place 1 Applicatorful vaginally 3 (three) times a week. Gwenlyn Perking, FNP Taking Active   gabapentin (NEURONTIN) 300 MG capsule [id number] Yes Take 1 capsule (300 mg total) by mouth 3 (three) times  daily. Gwenlyn Perking, FNP Taking Active   Insulin Glargine [facility]) 100 UNIT/ML [id number] Yes Inject 32 Units into the skin at bedtime.  Patient taking differently: Inject 35 Units into the skin at bedtime.   Gwenlyn Perking, FNP Taking Active            Med Note Parthenia Ames Mar 25, 2021  9:23 AM) Gets via [employer] patient assistance   insulin lispro (HUMALOG KWIKPEN) 100 UNIT/ML KwikPen [id number] Yes Before each meal, 3x daily. For blood sugar of 140-199: 2 units, 200-250: 4 units, 251-299: 8 units, 300-349: 10 units, for 350 or above: 14 units. Gwenlyn Perking, FNP Taking Active            Med Note Parthenia Ames Mar 25, 2021  9:23 AM) Marguerite Olea via [employer] patient assistance  losartan-hydrochlorothiazide (HYZAAR) 100-25 MG tablet [id number] Yes Take 1 tablet by mouth daily. Gwenlyn Perking, FNP Taking Active    metFORMIN (GLUCOPHAGE-XR) 500 MG 24 hr tablet [id number] Yes Take 2 tablets (1,000 mg total) by mouth 2 (two) times daily with a meal. Gwenlyn Perking, FNP Taking Active   Multiple Vitamins-Minerals (MULTIVITAMIN PO) [id number] Yes Take 1 tablet by mouth daily.  [provider] Taking Active Pharmacy Records  rosuvastatin (CRESTOR) 10 MG tablet [id number] Yes Take 1 tablet (10 mg total) by mouth daily. Gwenlyn Perking, FNP Taking Active   UNABLE TO FIND [id number] Yes Collagen Peptide - 1 scoop qd [provider] Taking Active   valACYclovir (VALTREX) 1000 MG tablet [id number] Yes Take 1,000 mg by mouth 2 (two) times daily. [provider] Taking Active   Med List Note Elyn Peers, CPhT 02/16/20 1543): Daughter Dancy: [phone]            Patient Active Problem List   Diagnosis Date Noted   Neuropathy 01/06/2021   Uterine leiomyoma 06/18/2020   DKA, type 2 ([place]) 02/16/2020   Hyperkalemia 02/16/2020   Hypercalcemia 82/64/1583   Acute metabolic encephalopathy 09/40/7680   AKI (acute kidney injury) ([place]) 02/16/2020   Shingles of eyelid 02/16/2020   Hypertensive urgency 02/16/2020   Hidradenitis axillaris 08/31/2016   Visit for screening mammogram 08/31/2016   Impingement syndrome of right shoulder 08/12/2016   Anxiety and depression 11/19/2015   Hyperlipidemia 09/02/2014   Uncontrolled diabetes mellitus ([place]) 11/22/2013   HTN (hypertension) 11/22/2013    Immunization History  Administered Date(s) Administered   Influenza,inj,Quad PF,6+ Mos 10/17/2017, 06/05/2019   Influenza-Unspecified 07/21/2014   Moderna Sars-Covid-2 Vaccination 12/09/2019, 12/29/2019, 08/29/2020   Pneumococcal Conjugate-13 03/16/2020   Tdap 03/16/2020    Conditions to be addressed/monitored: HTN, HLD, and DMII  Care Plan : PHARMD MEDICATION MANAGEMENT  Updates made by Lavera Guise, [place] since 04/10/2021 12:00 AM     Problem: DISEASE PROGRESSION PREVENTION       Long-Range Goal: T2DM   Recent Progress: Not on track  Priority: High  Note:   Current Barriers:  Unable to independently afford treatment regimen--MEDICARE COVERAGE GAP, HIGH COPAYS Unable to achieve control of T2DM  Suboptimal therapeutic regimen for T2DM   Pharmacist Clinical Goal(s):  Over the next 90 days, patient will verbalize ability to afford treatment regimen achieve control of T2DM as evidenced by GOAL A1C<7%, CONTROLLED BLOOD SUGARS adhere to prescribed medication regimen as evidenced by IMPROVED GLYCEMIC CONTROL  through collaboration with PharmD and provider.    Interventions: 1:1 collaboration with Gwenlyn Perking, FNP regarding development  and update of comprehensive plan of care as evidenced by provider attestation and co-signature Inter-disciplinary care team collaboration (see longitudinal plan of care) Comprehensive medication review performed; medication list updated in electronic medical record  Diabetes: Uncontrolled; current treatment:BASAGLAR, HUMALOG; add back metformin WE DISCONTINUED THE JANUMET  Bgs have since gone up--staying in 200s Increase Basaglar to 35 units nightly-patient verbalizes understanding She has been administering additional Humalog at night Humalog sliding scale as follows: Before each meal, 3x daily. For blood sugar of 140-199: 2 units, 200-250: 4 units, 251-299: 8 units, 300-349: 10 units, for 350 or above: 14 units. Ordered C-peptide- level 3.0 post prandial-indicative of T2DM Endocrine recommends labs to see if T1 Vs T2-->ZnT8, GAD, insulin antibodies--will see if insurance will allow, can be costly Can trial GLP1 if unable to get additional labs Current glucose readings: fasting glucose: 200s, post prandial glucose: 200s+ reports hyperglycemic symptoms Discussed meal planning options and Plate method for healthy eating (30-45 cabs per meal, 2x15g snacks) Avoid sugary drinks and desserts Incorporate balanced protein, non  starchy veggies, 1 serving of carbohydrate with each meal Increase water intake Increase physical activity as able Current exercise: n/a Educated on diet, medication adjustments Assessed patient finances. Enrolled patient in [employer] for basaglar and humalog insulin pens--patient approved for basaglar/humalog until 09/25/21-will ship to patient's home  Patient Goals/Self-Care Activities Over the next 90 days, patient will:  - take medications as prescribed check glucose CONTINUOUSLY USING CGM, document, and provide at future appointments engage in dietary modifications by [place] METHOD  Follow Up Plan: Telephone follow up appointment with care management team member scheduled for: next week      Medication Assistance:  basaglar, humalog and new start truliicty obtained through [employer] medication assistance program.  Enrollment ends 09/25/21  Patient's preferred pharmacy is:  [facility], [place] [address] Phone: [phone] Fax: [facility], [place] - [address] [place] [address] [place] [address] [place] [address] [place] [address] Phone: [phone] Fax: [phone]   Follow Up:  Patient agrees to Care Plan and Follow-up.  Plan: Telephone follow up appointment with care management team member scheduled for:  2 weeks  Regina Eck, PharmD, BCPS Clinical Pharmacist, [place]  II Phone [phone]

## 2021-04-08 ENCOUNTER — Encounter: Payer: Self-pay | Admitting: Family Medicine

## 2021-04-08 ENCOUNTER — Ambulatory Visit (INDEPENDENT_AMBULATORY_CARE_PROVIDER_SITE_OTHER): Payer: Medicare Other | Admitting: Family Medicine

## 2021-04-08 VITALS — BP 142/80 | HR 88 | Temp 96.9°F | Resp 20 | Ht 66.0 in | Wt 208.0 lb

## 2021-04-08 DIAGNOSIS — E119 Type 2 diabetes mellitus without complications: Secondary | ICD-10-CM

## 2021-04-08 DIAGNOSIS — E782 Mixed hyperlipidemia: Secondary | ICD-10-CM | POA: Diagnosis not present

## 2021-04-08 DIAGNOSIS — I10 Essential (primary) hypertension: Secondary | ICD-10-CM

## 2021-04-08 LAB — BAYER DCA HB A1C WAIVED: HB A1C (BAYER DCA - WAIVED): 7.4 % — ABNORMAL HIGH (ref <7.0)

## 2021-04-08 NOTE — Progress Notes (Signed)
Established Patient Office Visit  Subjective:  Patient ID: Rebecca Cook, female    DOB: [birth year]  Age: 65 y.o. MRN: [id number]  CC:  Chief Complaint  Patient presents with   Medical Management of Chronic Issues    HPI Rebecca Cook presents for chronic follow up.  DM Patient denies foot ulcerations, nausea, paresthesia of the feet, polydipsia, polyuria, visual disturbances, vomiting, and weight loss.  Current diabetic medications include: started trulicity yesterday, basaglar, and humalog Compliant with meds - Yes  Current monitoring regimen:  dexcom Home blood sugar records: fasting range: 120 Any episodes of hypoglycemia? yes - 2x, once at 30 and once at 68 was awakened by alarm  Eye exam current (within one year): yes, need to request records Weight trend: stable Current diet: diabetic Current exercise:  signing up for silver sneakers  Is She on ACE inhibitor or angiotensin II receptor blocker?  Yes, losartan-HCTZ Is She on statin? Yes crestor   She will follow up with Almyra Free again in a few weeks to discuss blood sugars and tolerance to Trulicity.    Past Medical History:  Diagnosis Date   Diabetes mellitus, type II (Rebecca Cook)    Frequent headaches    GERD (gastroesophageal reflux disease)    Hypertension    Kidney stones    None current, last had kidney stones in 1986    Past Surgical History:  Procedure Laterality Date   TONSILLECTOMY AND [place]    Family History  Problem Relation Age of Onset   Cancer Mother        Lung   Heart disease Father    Hypertension Father    Heart disease Paternal Grandmother        Thyroid   Hypertension Paternal Grandmother    Cancer - Other Brother    Diabetes Maternal Grandfather     Social History   Socioeconomic History   Marital status: Divorced    Spouse name: Not on file   Number of children: 2   Years of education: College   Highest education level: Not on file   Occupational History    Employer: [employer]  Tobacco Use   Smoking status: Former   Smokeless tobacco: Never  Substance and Sexual Activity   Alcohol use: Yes    Alcohol/week: 0.0 standard drinks    Comment: occ   Drug use: No   Sexual activity: Yes    Birth control/protection: None  Other Topics Concern   Not on file  Social History Narrative   Not on file   Social Determinants of Health   Financial Resource Strain: Not on file  Food Insecurity: Not on file  Transportation Needs: Not on file  Physical Activity: Not on file  Stress: Not on file  Social Connections: Not on file  Intimate Partner Violence: Not on file    Outpatient Medications Prior to Visit  Medication Sig Dispense Refill   cetirizine (ZYRTEC) 10 MG tablet Take 1 tablet (10 mg total) by mouth daily. 30 tablet 11   estradiol (ESTRACE VAGINAL) 0.1 MG/GM vaginal cream Place 1 Applicatorful vaginally 3 (three) times a week. 42.5 g 12   gabapentin (NEURONTIN) 300 MG capsule Take 1 capsule (300 mg total) by mouth 3 (three) times daily. 270 capsule 1   Insulin Glargine (BASAGLAR KWIKPEN) 100 UNIT/ML Inject 32 Units into the skin at bedtime. (Patient taking differently: Inject 35 Units into the skin at bedtime.) 45 mL  3   insulin lispro (HUMALOG KWIKPEN) 100 UNIT/ML KwikPen Before each meal, 3x daily. For blood sugar of 140-199: 2 units, 200-250: 4 units, 251-299: 8 units, 300-349: 10 units, for 350 or above: 14 units. 15 mL 1   losartan-hydrochlorothiazide (HYZAAR) 100-25 MG tablet Take 1 tablet by mouth daily. 90 tablet 1   metFORMIN (GLUCOPHAGE-XR) 500 MG 24 hr tablet Take 2 tablets (1,000 mg total) by mouth 2 (two) times daily with a meal. 120 tablet 2   Multiple Vitamins-Minerals (MULTIVITAMIN PO) Take 1 tablet by mouth daily.      rosuvastatin (CRESTOR) 10 MG tablet Take 1 tablet (10 mg total) by mouth daily. 90 tablet 3   UNABLE TO FIND Collagen Peptide - 1 scoop qd     valACYclovir (VALTREX) 1000 MG  tablet Take 1,000 mg by mouth 2 (two) times daily.     No facility-administered medications prior to visit.    No Known Allergies  ROS Review of Systems As per HPI.    Objective:    Physical Exam Vitals and nursing note reviewed.  Constitutional:      General: She is not in acute distress.    Appearance: She is not ill-appearing, toxic-appearing or diaphoretic.  Cardiovascular:     Rate and Rhythm: Normal rate and regular rhythm.     Heart sounds: Normal heart sounds. No murmur heard. Pulmonary:     Effort: Pulmonary effort is normal. No respiratory distress.     Breath sounds: Normal breath sounds.  Musculoskeletal:     Right lower leg: No edema.     Left lower leg: No edema.  Skin:    General: Skin is warm and dry.  Neurological:     General: No focal deficit present.     Mental Status: She is alert and oriented to person, place, and time.  Psychiatric:        Mood and Affect: Mood normal.        Behavior: Behavior normal.    BP (!) 142/80   Pulse 88   Temp (!) 96.9 F (36.1 C) (Temporal)   Resp 20   Ht $R'5\' 6"'rp$  (1.676 m)   Wt 208 lb (94.3 kg)   SpO2 96%   BMI 33.57 kg/m  Wt Readings from Last 3 Encounters:  04/08/21 208 lb (94.3 kg)  02/10/21 196 lb 6.4 oz (89.1 kg)  01/06/21 199 lb 6.4 oz (90.4 kg)     Health Maintenance Due  Topic Date Due   Zoster Vaccines- Shingrix (1 of 2) Never done   COLONOSCOPY (Pts 45-87yrs Insurance coverage will need to be confirmed)  09/26/2020   OPHTHALMOLOGY EXAM  11/10/2020   COVID-19 Vaccine (4 - Booster) 12/28/2020   PNA vac Low Risk Adult (2 of 2 - PPSV23) 03/16/2021    There are no preventive care reminders to display for this patient.  Lab Results  Component Value Date   TSH 2.11 10/17/2017   Lab Results  Component Value Date   WBC 8.1 01/06/2021   HGB 15.1 01/06/2021   HCT 45.4 01/06/2021   MCV 84 01/06/2021   PLT 251 01/06/2021   Lab Results  Component Value Date   NA 138 01/06/2021   K 4.1  01/06/2021   CO2 24 01/06/2021   GLUCOSE 191 (H) 03/26/2021   BUN 25 01/06/2021   CREATININE 0.75 01/06/2021   BILITOT 0.4 01/06/2021   ALKPHOS 80 01/06/2021   AST 15 01/06/2021   ALT 16 01/06/2021   PROT 7.2  01/06/2021   ALBUMIN 4.2 01/06/2021   CALCIUM 10.0 01/06/2021   ANIONGAP 12 02/19/2020   EGFR 88 01/06/2021   GFR 94.70 03/02/2020   Lab Results  Component Value Date   CHOL 235 (H) 01/06/2021   Lab Results  Component Value Date   HDL 39 (L) 01/06/2021   Lab Results  Component Value Date   LDLCALC 149 (H) 01/06/2021   Lab Results  Component Value Date   TRIG 254 (H) 01/06/2021   Lab Results  Component Value Date   CHOLHDL 6.0 (H) 01/06/2021   Lab Results  Component Value Date   HGBA1C 8.5 (H) 01/06/2021      Assessment & Plan:   Thao was seen today for medical management of chronic issues.  Diagnoses and all orders for this visit:  Type 2 diabetes mellitus without complication, without long-term current use of insulin (HCC) A1c at goal today at 6.8. Started trulicity yesterday and been titrating down on insulin. Will follow up again with Almyra Free.  -     Microalbumin / creatinine urine ratio; Future -     Bayer DCA Hb A1c Waived; Future -     CBC with Differential/Platelet; Future -     CMP14+EGFR; Future -     Lipid panel; Future  Primary hypertension Not quite at goal today. She will buy a monitor to check BP at home. Will notify Almyra Free of home reading at next visit. Labs pending.  -     Microalbumin / creatinine urine ratio; Future -     Bayer DCA Hb A1c Waived; Future -     CBC with Differential/Platelet; Future -     CMP14+EGFR; Future -     Lipid panel; Future  Mixed hyperlipidemia On crestor. Labs pending.  -     Microalbumin / creatinine urine ratio; Future -     Bayer DCA Hb A1c Waived; Future -     CBC with Differential/Platelet; Future -     CMP14+EGFR; Future -     Lipid panel; Future  Follow-up: Return in about 3 months (around  07/09/2021) for chronic follow up.  The patient indicates understanding of these issues and agrees with the plan.    Gwenlyn Perking, FNP

## 2021-04-09 ENCOUNTER — Other Ambulatory Visit: Payer: Self-pay | Admitting: Family Medicine

## 2021-04-09 DIAGNOSIS — E0821 Diabetes mellitus due to underlying condition with diabetic nephropathy: Secondary | ICD-10-CM

## 2021-04-09 LAB — CBC WITH DIFFERENTIAL/PLATELET
Basophils Absolute: 0.1 10*3/uL (ref 0.0–0.2)
Basos: 1 %
EOS (ABSOLUTE): 0.3 10*3/uL (ref 0.0–0.4)
Eos: 3 %
Hematocrit: 43.5 % (ref 34.0–46.6)
Hemoglobin: 14.3 g/dL (ref 11.1–15.9)
Immature Grans (Abs): 0 10*3/uL (ref 0.0–0.1)
Immature Granulocytes: 0 %
Lymphocytes Absolute: 2.7 10*3/uL (ref 0.7–3.1)
Lymphs: 29 %
MCH: 26.3 pg — ABNORMAL LOW (ref 26.6–33.0)
MCHC: 32.9 g/dL (ref 31.5–35.7)
MCV: 80 fL (ref 79–97)
Monocytes Absolute: 0.7 10*3/uL (ref 0.1–0.9)
Monocytes: 8 %
Neutrophils Absolute: 5.3 10*3/uL (ref 1.4–7.0)
Neutrophils: 59 %
Platelets: 257 10*3/uL (ref 150–450)
RBC: 5.43 x10E6/uL — ABNORMAL HIGH (ref 3.77–5.28)
RDW: 13.7 % (ref 11.7–15.4)
WBC: 9 10*3/uL (ref 3.4–10.8)

## 2021-04-09 LAB — CMP14+EGFR
ALT: 10 IU/L (ref 0–32)
AST: 11 IU/L (ref 0–40)
Albumin/Globulin Ratio: 1.6 (ref 1.2–2.2)
Albumin: 4.3 g/dL (ref 3.8–4.8)
Alkaline Phosphatase: 80 IU/L (ref 44–121)
BUN/Creatinine Ratio: 23 (ref 12–28)
BUN: 16 mg/dL (ref 8–27)
Bilirubin Total: 0.4 mg/dL (ref 0.0–1.2)
CO2: 26 mmol/L (ref 20–29)
Calcium: 9.7 mg/dL (ref 8.7–10.3)
Chloride: 101 mmol/L (ref 96–106)
Creatinine, Ser: 0.69 mg/dL (ref 0.57–1.00)
Globulin, Total: 2.7 g/dL (ref 1.5–4.5)
Glucose: 139 mg/dL — ABNORMAL HIGH (ref 65–99)
Potassium: 4.5 mmol/L (ref 3.5–5.2)
Sodium: 141 mmol/L (ref 134–144)
Total Protein: 7 g/dL (ref 6.0–8.5)
eGFR: 96 mL/min/{1.73_m2} (ref >59)

## 2021-04-09 LAB — LIPID PANEL
Chol/HDL Ratio: 3.5 ratio (ref 0.0–4.4)
Cholesterol, Total: 145 mg/dL (ref 100–199)
HDL: 41 mg/dL (ref >39)
LDL Chol Calc (NIH): 79 mg/dL (ref 0–99)
Triglycerides: 144 mg/dL (ref 0–149)
VLDL Cholesterol Cal: 25 mg/dL (ref 5–40)

## 2021-04-09 LAB — MICROALBUMIN / CREATININE URINE RATIO
Creatinine, Urine: 182.1 mg/dL
Microalb/Creat Ratio: 10 mg/g creat (ref 0–29)
Microalbumin, Urine: 18.6 ug/mL

## 2021-04-10 NOTE — Patient Instructions (Signed)
Visit Information  PATIENT GOALS:  Goals Addressed               This Visit's Progress     Patient Stated     T2DM (pt-stated)        Current Barriers:  Unable to independently afford treatment regimen--MEDICARE COVERAGE GAP, HIGH COPAYS Unable to achieve control of T2DM  Suboptimal therapeutic regimen for T2DM   Pharmacist Clinical Goal(s):  Over the next 90 days, patient will verbalize ability to afford treatment regimen achieve control of T2DM as evidenced by GOAL A1C<7%, CONTROLLED BLOOD SUGARS adhere to prescribed medication regimen as evidenced by IMPROVED GLYCEMIC CONTROL through collaboration with PharmD and provider.    Interventions: 1:1 collaboration with Gwenlyn Perking, FNP regarding development and update of comprehensive plan of care as evidenced by provider attestation and co-signature Inter-disciplinary care team collaboration (see longitudinal plan of care) Comprehensive medication review performed; medication list updated in electronic medical record  Diabetes: Uncontrolled; current treatment:BASAGLAR, HUMALOG; metformin BGs are trending down, but still elevated for patient's goal Patient uses dexcom for CGM START trulicity 0.75mg  sq weekly Will enroll in [employer] patient assistance Denies personal and family history of Medullary thyroid cancer (MTC) Titrate to 1.5mg  sq weekly in 2-4 weeks pending patient response CONTINUE Basaglar 35 units night CONTINUE Humalog sliding scale as follows: Before each meal, 3x daily. For blood sugar of 140-199: 2 units, 200-250: 4 units, 251-299: 8 units, 300-349: 10 units, for 350 or above: 14 units. She has NOT been administering additional Humalog at night (was previously requiring this dose) C-peptide- level 3.0 post prandial-indicative of T2DM (BUT NOT A DEFINITE) Endocrine recommends labs to see if T1 Vs T2-->ZnT8, GAD, insulin antibodies--NOT COVERED ON INSURANCE, WILL POSTPONE THESE LABS AND ASSESS RESPONSE  FROM GLP1 THERAPY Current glucose readings: fasting glucose: 200s, post prandial glucose: 200s+ reports hyperglycemic symptoms Discussed meal planning options and Plate method for healthy eating (30-45 cabs per meal, 2x15g snacks) Avoid sugary drinks and desserts Incorporate balanced protein, non starchy veggies, 1 serving of carbohydrate with each meal Increase water intake Increase physical activity as able Current exercise: n/a Educated on diet, medication adjustments Assessed patient finances. Enrolled patient in [employer] for basaglar and humalog insulin pens--patient approved for basaglar/humalog until 09/25/21-will ship to patient's home; will enroll in patient assistance for trulicity   Patient Goals/Self-Care Activities Over the next 90 days, patient will:  - take medications as prescribed check glucose CONTINUOUSLY USING CGM, document, and provide at future appointments engage in dietary modifications by [place] METHOD  Follow Up Plan: Telephone follow up appointment with care management team member scheduled for: next week         The patient verbalized understanding of instructions, educational materials, and care plan provided today and declined offer to receive copy of patient instructions, educational materials, and care plan.   Telephone follow up appointment with care management team member scheduled for: 2 weeks   Signature Regina Eck, PharmD, BCPS Clinical Pharmacist, [place]  II Phone [phone]

## 2021-04-15 ENCOUNTER — Ambulatory Visit: Payer: Self-pay | Admitting: Pharmacist

## 2021-04-15 ENCOUNTER — Other Ambulatory Visit: Payer: Self-pay

## 2021-04-15 DIAGNOSIS — R928 Other abnormal and inconclusive findings on diagnostic imaging of breast: Secondary | ICD-10-CM

## 2021-04-15 DIAGNOSIS — E119 Type 2 diabetes mellitus without complications: Secondary | ICD-10-CM

## 2021-04-15 NOTE — Progress Notes (Signed)
Chronic Care Management Pharmacy Note  04/15/2021 Name:  Rebecca Cook MRN:  [id number] DOB:  [birth year]  Summary: diabetes management  Recommendations/Changes made from today's visit:  Diabetes: Uncontrolled (improving); current treatment:BASAGLAR, HUMALOG; TRULICITY (wednesdays), metformin BGs are trending down, but still elevated for patient's goal Patient uses dexcom for CGM CONTINUE metformin INCREASE TO trulicity 1.$RemoveBefore'5mg'lWlozHdEVTcIn$  sq weekly Will enroll in [employer] patient assistance Denies personal and family history of Medullary thyroid cancer (MTC) TOLERATING WELL DECREASE Basaglar TO 20-25 units nightly CONTINUE Humalog sliding scale as follows: Before each meal, 3x daily. For blood sugar of 140-199: 2 units, 200-250: 4 units, 251-299: 8 units, 300-349: 10 units, for 350 or above: 14 units. She has NOT been administering additional Humalog at night (was previously requiring this dose) Current glucose readings: fasting glucose: 120-145s, post prandial glucose: <200 Educated on diet, medication adjustments Assessed patient finances. Enrolled patient in [employer] for basaglar and humalog insulin pens--patient approved for basaglar/humalog/trulicity until 96/75/91-[facility] ship to patient's home  Follow Up Plan: Telephone follow up appointment with care management team member scheduled for: 1 month   Subjective: Rebecca Cook is an 65 y.o. year old female who is a primary patient of Gwenlyn Perking, [place].  The CCM team was consulted for assistance with disease management and care coordination needs.    Engaged with patient by telephone for follow up visit in response to provider referral for pharmacy case management and/or care coordination services.   Consent to Services:  The patient was given information about Chronic Care Management services, agreed to services, and gave verbal consent prior to initiation of services.  Please see initial visit note for detailed  documentation.   Patient Care Team: Gwenlyn Perking, FNP as PCP - General (Family Medicine) Gala Romney Cristopher Estimable, MD as Consulting Physician (Gastroenterology) Lavera Guise, [facility] as Pharmacist (Family Medicine)  Objective:  Lab Results  Component Value Date   CREATININE 0.69 04/08/2021   CREATININE 0.75 01/06/2021   CREATININE 0.63 03/02/2020    Lab Results  Component Value Date   HGBA1C 7.4 (H) 04/08/2021   Last diabetic Eye exam: No results found for: HMDIABEYEEXA  Last diabetic Foot exam: No results found for: HMDIABFOOTEX      Component Value Date/Time   CHOL 145 04/08/2021 1129   TRIG 144 04/08/2021 1129   HDL 41 04/08/2021 1129   CHOLHDL 3.5 04/08/2021 1129   CHOLHDL 6 10/17/2017 1534   VLDL 67.0 (H) 10/17/2017 1534   LDLCALC 79 04/08/2021 1129   LDLDIRECT 195.0 10/17/2017 1534    Hepatic Function Latest Ref Rng & Units 04/08/2021 01/06/2021 03/02/2020  Total Protein 6.0 - 8.5 g/dL 7.0 7.2 7.0  Albumin 3.8 - 4.8 g/dL 4.3 4.2 4.0  AST 0 - 40 IU/L $Remov'11 15 20  'OIgeqV$ ALT 0 - 32 IU/L $Remov'10 16 23  'iEhFRD$ Alk Phosphatase 44 - 121 IU/L 80 80 69  Total Bilirubin 0.0 - 1.2 mg/dL 0.4 0.4 0.7  Bilirubin, Direct 0.0 - 0.3 mg/dL - - -    Lab Results  Component Value Date/Time   TSH 2.11 10/17/2017 03:34 PM   TSH 1.94 09/11/2014 07:44 AM    CBC Latest Ref Rng & Units 04/08/2021 01/06/2021 03/02/2020  WBC 3.4 - 10.8 x10E3/uL 9.0 8.1 7.1  Hemoglobin 11.1 - 15.9 g/dL 14.3 15.1 15.0  Hematocrit 34.0 - 46.6 % 43.5 45.4 44.8  Platelets 150 - 450 x10E3/uL 257 251 245.0    No results found for: VD25OH  Clinical ASCVD: No  The 10-year ASCVD risk score Mikey Bussing DC Jr., et al., 2013) is: 17.9%   Values used to calculate the score:     Age: 31 years     Sex: Female     Is Non-Hispanic African American: No     Diabetic: Yes     Tobacco smoker: No     Systolic Blood Pressure: 939 mmHg     Is BP treated: Yes     HDL Cholesterol: 41 mg/dL     Total Cholesterol: 145 mg/dL    Other: (CHADS2VASc if  Afib, PHQ9 if depression, MMRC or CAT for COPD, ACT, DEXA)  Social History   Tobacco Use  Smoking Status Former  Smokeless Tobacco Never   BP Readings from Last 3 Encounters:  04/08/21 (!) 142/80  01/06/21 121/76  08/17/20 128/82   Pulse Readings from Last 3 Encounters:  04/08/21 88  08/17/20 89  07/01/20 91   Wt Readings from Last 3 Encounters:  04/08/21 208 lb (94.3 kg)  02/10/21 196 lb 6.4 oz (89.1 kg)  01/06/21 199 lb 6.4 oz (90.4 kg)    Assessment: Review of patient past medical history, allergies, medications, health status, including review of consultants reports, laboratory and other test data, was performed as part of comprehensive evaluation and provision of chronic care management services.   SDOH:  (Social Determinants of Health) assessments and interventions performed:    CCM Care Plan  No Known Allergies  Medications Reviewed Today     Reviewed by Lavera Guise, [facility] (Pharmacist) on 04/15/21 at 1521  Med List Status: <None>   Medication Order Taking? Sig Documenting Provider Last Dose Status Informant  cetirizine (ZYRTEC) 10 MG tablet [id number] No Take 1 tablet (10 mg total) by mouth daily. Gwenlyn Perking, FNP Taking Active   estradiol [facility] VAGINAL) 0.1 MG/GM vaginal cream [id number] No Place 1 Applicatorful vaginally 3 (three) times a week. Gwenlyn Perking, FNP Taking Active   gabapentin (NEURONTIN) 300 MG capsule [id number] No Take 1 capsule (300 mg total) by mouth 3 (three) times daily. Gwenlyn Perking, FNP Taking Active   Insulin Glargine [facility] KWIKPEN) 100 UNIT/ML [id number] No Inject 32 Units into the skin at bedtime.  Patient taking differently: Inject 35 Units into the skin at bedtime.   Gwenlyn Perking, FNP Taking Active            Med Note Parthenia Ames Mar 25, 2021  9:23 AM) Gets via [employer] patient assistance   insulin lispro (HUMALOG KWIKPEN) 100 UNIT/ML KwikPen [id number] No Before each meal, 3x daily. For blood  sugar of 140-199: 2 units, 200-250: 4 units, 251-299: 8 units, 300-349: 10 units, for 350 or above: 14 units. Gwenlyn Perking, FNP Taking Active            Med Note Parthenia Ames Mar 25, 2021  9:23 AM) Marguerite Olea via [place] [employer] patient assistance  losartan-hydrochlorothiazide [facility]) 100-25 MG tablet [id number]  TAKE 1 TABLET BY MOUTH DAILY Gwenlyn Perking, FNP  Active   metFORMIN (GLUCOPHAGE-XR) 500 MG 24 hr tablet [id number] No Take 2 tablets (1,000 mg total) by mouth 2 (two) times daily with a meal. Gwenlyn Perking, FNP Taking Active   Multiple Vitamins-Minerals (MULTIVITAMIN PO) [id number] No Take 1 tablet by mouth daily.  [provider] Taking Active Pharmacy Records  rosuvastatin (CRESTOR) 10 MG tablet [id number] No Take 1 tablet (10 mg total) by mouth daily. Lilia Pro,  Arman Bogus, FNP Taking Active   UNABLE TO FIND [id number] No Collagen Peptide - 1 scoop qd [provider] Taking Active   valACYclovir (VALTREX) 1000 MG tablet [id number] No Take 1,000 mg by mouth 2 (two) times daily. [provider] Taking Active   Med List Note Elyn Peers, CPhT 02/16/20 1543): Daughter Dancy: [phone]            Patient Active Problem List   Diagnosis Date Noted   Neuropathy 01/06/2021   Uterine leiomyoma 06/18/2020   DKA, type 2 ([place]) 02/16/2020   Hyperkalemia 02/16/2020   Hypercalcemia 24/40/1027   Acute metabolic encephalopathy 25/36/6440   AKI (acute kidney injury) ([place]) 02/16/2020   Shingles of eyelid 02/16/2020   Hypertensive urgency 02/16/2020   Hidradenitis axillaris 08/31/2016   Visit for screening mammogram 08/31/2016   Impingement syndrome of right shoulder 08/12/2016   Anxiety and depression 11/19/2015   Hyperlipidemia 09/02/2014   Uncontrolled diabetes mellitus ([place]) 11/22/2013   HTN (hypertension) 11/22/2013    Immunization History  Administered Date(s) Administered   Influenza,inj,Quad PF,6+ Mos 10/17/2017, 06/05/2019    Influenza-Unspecified 07/21/2014   Moderna Sars-Covid-2 Vaccination 12/09/2019, 12/29/2019, 08/29/2020   Pneumococcal Conjugate-13 03/16/2020   Tdap 03/16/2020    Conditions to be addressed/monitored: DMII  Care Plan : PHARMD MEDICATION MANAGEMENT  Updates made by Lavera Guise, [place] since 04/15/2021 12:00 AM     Problem: DISEASE PROGRESSION PREVENTION      Long-Range Goal: T2DM   Recent Progress: Not on track  Priority: High  Note:   Current Barriers:  Unable to independently afford treatment regimen--MEDICARE COVERAGE GAP, HIGH COPAYS Unable to achieve control of T2DM  Suboptimal therapeutic regimen for T2DM   Pharmacist Clinical Goal(s):  Over the next 90 days, patient will verbalize ability to afford treatment regimen achieve control of T2DM as evidenced by GOAL A1C<7%, CONTROLLED BLOOD SUGARS adhere to prescribed medication regimen as evidenced by IMPROVED GLYCEMIC CONTROL  through collaboration with PharmD and provider.    Interventions: 1:1 collaboration with Gwenlyn Perking, FNP regarding development and update of comprehensive plan of care as evidenced by provider attestation and co-signature Inter-disciplinary care team collaboration (see longitudinal plan of care) Comprehensive medication review performed; medication list updated in electronic medical record  Diabetes: Uncontrolled (improving); current treatment:BASAGLAR, HUMALOG; TRULICITY (wednesdays), metformin BGs are trending down, but still elevated for patient's goal Patient uses dexcom for CGM INCREASE TO trulicity 1.$RemoveBefore'5mg'ewDcMPmYpWnOo$  sq weekly Will enroll in [employer] patient assistance Denies personal and family history of Medullary thyroid cancer (MTC) TOLERATING WELL DECREASE Basaglar TO 20-25 units nightly CONTINUE Humalog sliding scale as follows: Before each meal, 3x daily. For blood sugar of 140-199: 2 units, 200-250: 4 units, 251-299: 8 units, 300-349: 10 units, for 350 or above: 14 units. She has NOT  been administering additional Humalog at night (was previously requiring this dose) C-peptide- level 3.0 post prandial-indicative of T2DM (BUT NOT A DEFINITE) Endocrine recommends labs to see if T1 Vs T2-->ZnT8, GAD, insulin antibodies--NOT COVERED ON INSURANCE, WILL POSTPONE THESE LABS AND ASSESS RESPONSE FROM GLP1 THERAPY Current glucose readings: fasting glucose: 120-145s, post prandial glucose: <200 reports hyperglycemic symptoms Discussed meal planning options and Plate method for healthy eating (30-45 cabs per meal, 2x15g snacks) Avoid sugary drinks and desserts Incorporate balanced protein, non starchy veggies, 1 serving of carbohydrate with each meal Increase water intake Increase physical activity as able Current exercise: n/a Educated on diet, medication adjustments Assessed patient finances. Enrolled patient in [employer] foundation for  basaglar and humalog insulin pens--patient approved for basaglar/humalog/trulicity until 31/54/00-QQ[employer] ship to patient's home  Patient Goals/Self-Care Activities Over the next 90 days, patient will:  - take medications as prescribed check glucose CONTINUOUSLY USING CGM, document, and provide at future appointments engage in dietary modifications by [place] METHOD  Follow Up Plan: Telephone follow up appointment with care management team member scheduled for: 1 month       Medication Assistance:  basaglar, humalog and trulicity obtained through [employer] medication assistance program.  Enrollment ends 09/25/21  Patient's preferred pharmacy is:  [facility], [place] [address] Phone: [phone] Fax: [facility], [place] - [address] [place] [address] [place] [address] Phone: [phone] Fax: [phone]  Uses pill box? No - n/a Pt endorses 100%  compliance  Follow Up:  Patient agrees to Care Plan and Follow-up.  Plan: Telephone follow up appointment with care management team member scheduled for:  2 months  Regina Eck, PharmD, BCPS Clinical Pharmacist, [place]  II Phone [phone]

## 2021-04-19 DIAGNOSIS — R928 Other abnormal and inconclusive findings on diagnostic imaging of breast: Secondary | ICD-10-CM | POA: Diagnosis not present

## 2021-04-20 NOTE — Patient Instructions (Signed)
Visit Information  PATIENT GOALS:  Goals Addressed               This Visit's Progress     Patient Stated     T2DM (pt-stated)        Current Barriers:  Unable to independently afford treatment regimen--MEDICARE COVERAGE GAP, HIGH COPAYS Unable to achieve control of T2DM  Suboptimal therapeutic regimen for T2DM   Pharmacist Clinical Goal(s):  Over the next 90 days, patient will verbalize ability to afford treatment regimen achieve control of T2DM as evidenced by GOAL A1C<7%, CONTROLLED BLOOD SUGARS adhere to prescribed medication regimen as evidenced by IMPROVED GLYCEMIC CONTROL through collaboration with PharmD and provider.    Interventions: 1:1 collaboration with Gwenlyn Perking, FNP regarding development and update of comprehensive plan of care as evidenced by provider attestation and co-signature Inter-disciplinary care team collaboration (see longitudinal plan of care) Comprehensive medication review performed; medication list updated in electronic medical record  Diabetes: Uncontrolled (improving); current treatment:BASAGLAR, HUMALOG; TRULICITY, metformin BGs are trending down, but still elevated for patient's goal Patient uses dexcom for CGM INCREASE TO trulicity 1.'5mg'$  sq weekly Will enroll in [employer] patient assistance Denies personal and family history of Medullary thyroid cancer (MTC) TOLERATING WELL DECREASE Basaglar TO 20-25 units nightly CONTINUE Humalog sliding scale as follows: Before each meal, 3x daily. For blood sugar of 140-199: 2 units, 200-250: 4 units, 251-299: 8 units, 300-349: 10 units, for 350 or above: 14 units. She has NOT been administering additional Humalog at night (was previously requiring this dose) C-peptide- level 3.0 post prandial-indicative of T2DM (BUT NOT A DEFINITE) Endocrine recommends labs to see if T1 Vs T2-->ZnT8, GAD, insulin antibodies--NOT COVERED ON INSURANCE, WILL POSTPONE THESE LABS AND ASSESS RESPONSE FROM GLP1  THERAPY Current glucose readings: fasting glucose: 120-145s, post prandial glucose: <200 reports hyperglycemic symptoms Discussed meal planning options and Plate method for healthy eating (30-45 cabs per meal, 2x15g snacks) Avoid sugary drinks and desserts Incorporate balanced protein, non starchy veggies, 1 serving of carbohydrate with each meal Increase water intake Increase physical activity as able Current exercise: n/a Educated on diet, medication adjustments Assessed patient finances. Enrolled patient in [employer] for basaglar and humalog insulin pens--patient approved for basaglar/humalog/trulicity until [id number] ship to patient's home  Patient Goals/Self-Care Activities Over the next 90 days, patient will:  - take medications as prescribed check glucose CONTINUOUSLY USING CGM, document, and provide at future appointments engage in dietary modifications by [place] METHOD  Follow Up Plan: Telephone follow up appointment with care management team member scheduled for: 1 month          The patient verbalized understanding of instructions, educational materials, and care plan provided today and declined offer to receive copy of patient instructions, educational materials, and care plan.   Telephone follow up appointment with care management team member scheduled for:  1 month  Regina Eck, PharmD, BCPS Clinical Pharmacist, [place]  II Phone [phone]

## 2021-04-21 DIAGNOSIS — H25811 Combined forms of age-related cataract, right eye: Secondary | ICD-10-CM | POA: Insufficient documentation

## 2021-04-22 DIAGNOSIS — H52213 Irregular astigmatism, bilateral: Secondary | ICD-10-CM | POA: Diagnosis not present

## 2021-04-22 DIAGNOSIS — H25811 Combined forms of age-related cataract, right eye: Secondary | ICD-10-CM | POA: Diagnosis not present

## 2021-04-22 DIAGNOSIS — H43812 Vitreous degeneration, left eye: Secondary | ICD-10-CM | POA: Diagnosis not present

## 2021-04-22 DIAGNOSIS — I10 Essential (primary) hypertension: Secondary | ICD-10-CM | POA: Diagnosis not present

## 2021-04-22 DIAGNOSIS — E1136 Type 2 diabetes mellitus with diabetic cataract: Secondary | ICD-10-CM | POA: Diagnosis not present

## 2021-04-22 DIAGNOSIS — H25813 Combined forms of age-related cataract, bilateral: Secondary | ICD-10-CM | POA: Diagnosis not present

## 2021-04-22 DIAGNOSIS — E669 Obesity, unspecified: Secondary | ICD-10-CM | POA: Diagnosis not present

## 2021-04-22 DIAGNOSIS — E1036 Type 1 diabetes mellitus with diabetic cataract: Secondary | ICD-10-CM | POA: Diagnosis not present

## 2021-04-23 DIAGNOSIS — Z961 Presence of intraocular lens: Secondary | ICD-10-CM | POA: Diagnosis not present

## 2021-04-23 DIAGNOSIS — B0230 Zoster ocular disease, unspecified: Secondary | ICD-10-CM | POA: Diagnosis not present

## 2021-04-23 DIAGNOSIS — H59032 Cystoid macular edema following cataract surgery, left eye: Secondary | ICD-10-CM | POA: Diagnosis not present

## 2021-04-23 DIAGNOSIS — H52213 Irregular astigmatism, bilateral: Secondary | ICD-10-CM | POA: Diagnosis not present

## 2021-05-14 ENCOUNTER — Encounter: Payer: Self-pay | Admitting: Family Medicine

## 2021-05-14 DIAGNOSIS — E08319 Diabetes mellitus due to underlying condition with unspecified diabetic retinopathy without macular edema: Secondary | ICD-10-CM | POA: Insufficient documentation

## 2021-05-21 ENCOUNTER — Ambulatory Visit (INDEPENDENT_AMBULATORY_CARE_PROVIDER_SITE_OTHER): Payer: Medicare Other | Admitting: Pharmacist

## 2021-05-21 DIAGNOSIS — E119 Type 2 diabetes mellitus without complications: Secondary | ICD-10-CM

## 2021-05-21 NOTE — Patient Instructions (Signed)
Visit Information  PATIENT GOALS:  Goals Addressed               This Visit's Progress     Patient Stated     T2DM (pt-stated)        Current Barriers:  Unable to independently afford treatment regimen--MEDICARE COVERAGE GAP, HIGH COPAYS Unable to achieve control of T2DM  Suboptimal therapeutic regimen for T2DM   Pharmacist Clinical Goal(s):  Over the next 90 days, patient will verbalize ability to afford treatment regimen achieve control of T2DM as evidenced by GOAL A1C<7%, CONTROLLED BLOOD SUGARS adhere to prescribed medication regimen as evidenced by IMPROVED GLYCEMIC CONTROL through collaboration with PharmD and provider.    Interventions: 1:1 collaboration with Gwenlyn Perking, FNP regarding development and update of comprehensive plan of care as evidenced by provider attestation and co-signature Inter-disciplinary care team collaboration (see longitudinal plan of care) Comprehensive medication review performed; medication list updated in electronic medical record  Diabetes: Uncontrolled (improving); current treatment: BASAGLAR 10units, HUMALOG 10 units w/ most meals; TRULICITY 1.'5mg'$  (wednesdays), metformin BGs are trending down, but still elevated for patient's goal Patient uses dexcom for CGM CONTINUE trulicity 1.'5mg'$  sq weekly Enrolled in [place] cares patient assistance (until 09/25/21) meds ship to patient's house (trulicity, Medical illustrator) Denies personal and family history of Medullary thyroid cancer (MTC) TOLERATING WELL DECREASE Basaglar TO 10 UNITS nightly DECREASE Humalog TO 5-7 UNITS WITH MEALS CONTINUE metformin C-peptide- level 3.0 post prandial-indicative of T2DM (BUT NOT A DEFINITE) Endocrine recommends labs to see if T1 Vs T2-->ZnT8, GAD, insulin antibodies--NOT COVERED ON INSURANCE, WILL POSTPONE THESE LABS AND ASSESS RESPONSE FROM GLP1 THERAPY Current glucose readings: fasting glucose: 120-145s, post prandial glucose: <180 reports hyperglycemic  symptoms Discussed meal planning options and Plate method for healthy eating (30-45 cabs per meal, 2x15g snacks) Avoid sugary drinks and desserts Incorporate balanced protein, non starchy veggies, 1 serving of carbohydrate with each meal Increase water intake Increase physical activity as able Current exercise: n/a Educated on diet, medication adjustments Assessed patient finances. Enrolled patient in [employer] for basaglar and humalog insulin pens--patient approved for basaglar/humalog/trulicity until [id number] ship to patient's home  Patient Goals/Self-Care Activities Over the next 90 days, patient will:  - take medications as prescribed check glucose CONTINUOUSLY USING CGM, document, and provide at future appointments engage in dietary modifications by [place] METHOD  Follow Up Plan: Telephone follow up appointment with care management team member scheduled for: November 2022         The patient verbalized understanding of instructions, educational materials, and care plan provided today and declined offer to receive copy of patient instructions, educational materials, and care plan.   Telephone follow up appointment with care management team member scheduled for: 07/2021  Signature Regina Eck, PharmD, BCPS Clinical Pharmacist, [place]  II Phone [phone]

## 2021-05-21 NOTE — Progress Notes (Signed)
Chronic Care Management Pharmacy Note  05/21/2021 Name:  Rebecca Cook MRN:  [id number] DOB:  08/28/1956  Summary: T2DM  Recommendations/Changes made from today's visit: Diabetes: Uncontrolled (improving); current treatment: BASAGLAR 10units, HUMALOG 10 units w/ most meals; TRULICITY 1.5mg  (wednesdays), metformin BGs are trending down, but still elevated for patient's goal Patient uses dexcom for CGM CONTINUE trulicity 1.5mg  sq weekly Enrolled in [place] cares patient assistance (until 09/25/21) meds ship to patient's house (trulicity, Medical illustrator) Denies personal and family history of Medullary thyroid cancer (MTC) TOLERATING WELL DECREASE Basaglar TO 10 UNITS nightly DECREASE Humalog TO 5-7 UNITS WITH MEALS CONTINUE metformin C-peptide- level 3.0 post prandial-indicative of T2DM (BUT NOT A DEFINITE) Endocrine recommends labs to see if T1 Vs T2-->ZnT8, GAD, insulin antibodies--NOT COVERED ON INSURANCE, WILL POSTPONE THESE LABS AND ASSESS RESPONSE FROM GLP1 THERAPY Current glucose readings: fasting glucose: 120-145s, post prandial glucose: <180 reports hyperglycemic symptoms Discussed meal planning options and Plate method for healthy eating (30-45 cabs per meal, 2x15g snacks) Avoid sugary drinks and desserts Incorporate balanced protein, non starchy veggies, 1 serving of carbohydrate with each meal Increase water intake Increase physical activity as able Current exercise: n/a Educated on diet, medication adjustments Assessed patient finances. Enrolled patient in [employer] for basaglar and humalog insulin pens--patient approved for basaglar/humalog/trulicity until 94/50/38-[facility] ship to patient's home  Follow Up Plan: Telephone follow up appointment with care management team member scheduled for: November 2022  Subjective: Rebecca Cook is an 65 y.o. year old female who is a primary patient of Gwenlyn Perking, [place].  The CCM team was consulted for assistance  with disease management and care coordination needs.    Engaged with patient by telephone for follow up visit in response to provider referral for pharmacy case management and/or care coordination services.   Consent to Services:  The patient was given information about Chronic Care Management services, agreed to services, and gave verbal consent prior to initiation of services.  Please see initial visit note for detailed documentation.   Patient Care Team: Gwenlyn Perking, FNP as PCP - General (Family Medicine) Gala Romney Cristopher Estimable, MD as Consulting Physician (Gastroenterology) Lavera Guise, [facility] as Pharmacist (Family Medicine)  Objective:  Lab Results  Component Value Date   CREATININE 0.69 04/08/2021   CREATININE 0.75 01/06/2021   CREATININE 0.63 03/02/2020    Lab Results  Component Value Date   HGBA1C 7.4 (H) 04/08/2021   Last diabetic Eye exam: No results found for: HMDIABEYEEXA  Last diabetic Foot exam: No results found for: HMDIABFOOTEX      Component Value Date/Time   CHOL 145 04/08/2021 1129   TRIG 144 04/08/2021 1129   HDL 41 04/08/2021 1129   CHOLHDL 3.5 04/08/2021 1129   CHOLHDL 6 10/17/2017 1534   VLDL 67.0 (H) 10/17/2017 1534   LDLCALC 79 04/08/2021 1129   LDLDIRECT 195.0 10/17/2017 1534    Hepatic Function Latest Ref Rng & Units 04/08/2021 01/06/2021 03/02/2020  Total Protein 6.0 - 8.5 g/dL 7.0 7.2 7.0  Albumin 3.8 - 4.8 g/dL 4.3 4.2 4.0  AST 0 - 40 IU/L 11 15 20   ALT 0 - 32 IU/L 10 16 23   Alk Phosphatase 44 - 121 IU/L 80 80 69  Total Bilirubin 0.0 - 1.2 mg/dL 0.4 0.4 0.7  Bilirubin, Direct 0.0 - 0.3 mg/dL - - -    Lab Results  Component Value Date/Time   TSH 2.11 10/17/2017 03:34 PM   TSH 1.94 09/11/2014 07:44 AM  CBC Latest Ref Rng & Units 04/08/2021 01/06/2021 03/02/2020  WBC 3.4 - 10.8 x10E3/uL 9.0 8.1 7.1  Hemoglobin 11.1 - 15.9 g/dL 14.3 15.1 15.0  Hematocrit 34.0 - 46.6 % 43.5 45.4 44.8  Platelets 150 - 450 x10E3/uL 257 251 245.0    No  results found for: VD25OH  Clinical ASCVD: No  The 10-year ASCVD risk score Mikey Bussing DC Jr., et al., 2013) is: 15.7%   Values used to calculate the score:     Age: 58 years     Sex: Female     Is Non-Hispanic African American: No     Diabetic: Yes     Tobacco smoker: No     Systolic Blood Pressure: 865 mmHg     Is BP treated: Yes     HDL Cholesterol: 41 mg/dL     Total Cholesterol: 145 mg/dL    Other: (CHADS2VASc if Afib, PHQ9 if depression, MMRC or CAT for COPD, ACT, DEXA)  Social History   Tobacco Use  Smoking Status Former  Smokeless Tobacco Never   BP Readings from Last 3 Encounters:  04/08/21 (!) 142/80  01/06/21 121/76  08/17/20 128/82   Pulse Readings from Last 3 Encounters:  04/08/21 88  08/17/20 89  07/01/20 91   Wt Readings from Last 3 Encounters:  04/08/21 208 lb (94.3 kg)  02/10/21 196 lb 6.4 oz (89.1 kg)  01/06/21 199 lb 6.4 oz (90.4 kg)    Assessment: Review of patient past medical history, allergies, medications, health status, including review of consultants reports, laboratory and other test data, was performed as part of comprehensive evaluation and provision of chronic care management services.   SDOH:  (Social Determinants of Health) assessments and interventions performed:    CCM Care Plan  No Known Allergies  Medications Reviewed Today     Reviewed by Lavera Guise, [facility] (Pharmacist) on 05/21/21 at 1343  Med List Status: <None>   Medication Order Taking? Sig Documenting Provider Last Dose Status Informant  cetirizine (ZYRTEC) 10 MG tablet [id number] No Take 1 tablet (10 mg total) by mouth daily. Gwenlyn Perking, FNP Taking Active   estradiol [facility] VAGINAL) 0.1 MG/GM vaginal cream [id number] No Place 1 Applicatorful vaginally 3 (three) times a week. Gwenlyn Perking, FNP Taking Active   gabapentin (NEURONTIN) 300 MG capsule [id number] No Take 1 capsule (300 mg total) by mouth 3 (three) times daily. Gwenlyn Perking, FNP Taking Active    Insulin Glargine [facility] KWIKPEN) 100 UNIT/ML [id number] No Inject 32 Units into the skin at bedtime.  Patient taking differently: Inject 35 Units into the skin at bedtime.   Gwenlyn Perking, FNP Taking Active            Med Note Parthenia Ames Mar 25, 2021  9:23 AM) Gets via [employer] patient assistance   insulin lispro (HUMALOG KWIKPEN) 100 UNIT/ML KwikPen [id number] No Before each meal, 3x daily. For blood sugar of 140-199: 2 units, 200-250: 4 units, 251-299: 8 units, 300-349: 10 units, for 350 or above: 14 units. Gwenlyn Perking, FNP Taking Active            Med Note Parthenia Ames Mar 25, 2021  9:23 AM) Marguerite Olea via [place] cares patient assistance  losartan-hydrochlorothiazide [facility]) 100-25 MG tablet [id number]  TAKE 1 TABLET BY MOUTH DAILY Gwenlyn Perking, FNP  Active   metFORMIN (GLUCOPHAGE-XR) 500 MG 24 hr tablet [id number] No Take 2 tablets (1,000 mg total) by  mouth 2 (two) times daily with a meal. Gwenlyn Perking, FNP Taking Active   Multiple Vitamins-Minerals (MULTIVITAMIN PO) [id number] No Take 1 tablet by mouth daily.  [provider] Taking Active Pharmacy Records  rosuvastatin (CRESTOR) 10 MG tablet [id number] No Take 1 tablet (10 mg total) by mouth daily. Gwenlyn Perking, FNP Taking Active   UNABLE TO FIND [id number] No Collagen Peptide - 1 scoop qd [provider] Taking Active   valACYclovir (VALTREX) 1000 MG tablet [id number] No Take 1,000 mg by mouth 2 (two) times daily. 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 associated with diabetes mellitus due to underlying condition ([place]) 05/14/2021   Neuropathy 01/06/2021   Uterine leiomyoma 06/18/2020   DKA, type 2 ([place]) 02/16/2020   Hyperkalemia 02/16/2020   Hypercalcemia 79/39/0300    Acute metabolic encephalopathy 92/33/0076   AKI (acute kidney injury) ([place]) 02/16/2020   Shingles of eyelid 02/16/2020   Hypertensive urgency 02/16/2020   Hidradenitis axillaris 08/31/2016   Visit for screening mammogram 08/31/2016   Impingement syndrome of right shoulder 08/12/2016   Anxiety and depression 11/19/2015   Hyperlipidemia 09/02/2014   Uncontrolled diabetes mellitus ([place]) 11/22/2013   HTN (hypertension) 11/22/2013    Immunization History  Administered Date(s) Administered   Influenza,inj,Quad PF,6+ Mos 10/17/2017, 06/05/2019   Influenza-Unspecified 07/21/2014   Moderna Sars-Covid-2 Vaccination 12/09/2019, 12/29/2019, 08/29/2020   Pneumococcal Conjugate-13 03/16/2020   Tdap 03/16/2020    Conditions to be addressed/monitored: DMII  Care Plan : PHARMD MEDICATION MANAGEMENT  Updates made by Lavera Guise, [place] since 05/21/2021 12:00 AM     Problem: DISEASE PROGRESSION PREVENTION      Long-Range Goal: T2DM   This Visit's Progress: On track  Recent Progress: Not on track  Priority: High  Note:   Current Barriers:  Unable to independently afford treatment regimen--MEDICARE COVERAGE GAP, HIGH COPAYS Unable to achieve control of T2DM  Suboptimal therapeutic regimen for T2DM   Pharmacist Clinical Goal(s):  Over the next 90 days, patient will verbalize ability to afford treatment regimen achieve control of T2DM as evidenced by GOAL A1C<7%, CONTROLLED BLOOD SUGARS adhere to prescribed medication regimen as evidenced by IMPROVED GLYCEMIC CONTROL  through collaboration with PharmD and provider.    Interventions: 1:1 collaboration with Gwenlyn Perking, FNP regarding development and update of comprehensive plan of care as evidenced by provider attestation and co-signature Inter-disciplinary care team collaboration (see longitudinal plan of care) Comprehensive medication review performed; medication list updated in electronic medical record  Diabetes: Uncontrolled  (improving); current treatment: BASAGLAR 10units, HUMALOG 10 units w/ most meals; TRULICITY 1.5mg  (wednesdays), metformin BGs are trending down, but still elevated for patient's goal Patient uses dexcom for CGM CONTINUE trulicity 1.5mg  sq weekly Enrolled in [place] cares patient assistance (until 09/25/21) meds ship to patient's house (trulicity, Medical illustrator) Denies personal and family history of Medullary thyroid cancer (MTC) TOLERATING WELL DECREASE Basaglar TO 10 UNITS nightly DECREASE Humalog TO 5-7 UNITS WITH MEALS CONTINUE metformin C-peptide- level 3.0 post prandial-indicative of T2DM (BUT NOT A DEFINITE) Endocrine recommends labs to see if T1 Vs T2-->ZnT8, GAD, insulin antibodies--NOT COVERED ON INSURANCE, WILL POSTPONE THESE LABS AND ASSESS RESPONSE FROM GLP1 THERAPY Current glucose readings: fasting glucose: 120-145s, post prandial glucose: <180 reports hyperglycemic symptoms Discussed meal  planning options and Plate method for healthy eating (30-45 cabs per meal, 2x15g snacks) Avoid sugary drinks and desserts Incorporate balanced protein, non starchy veggies, 1 serving of carbohydrate with each meal Increase water intake Increase physical activity as able Current exercise: n/a Educated on diet, medication adjustments Assessed patient finances. Enrolled patient in [employer] for basaglar and humalog insulin pens--patient approved for basaglar/humalog/trulicity until 67/01/41-[employer] ship to patient's home  Patient Goals/Self-Care Activities Over the next 90 days, patient will:  - take medications as prescribed check glucose CONTINUOUSLY USING CGM, document, and provide at future appointments engage in dietary modifications by [place] METHOD  Follow Up Plan: Telephone follow up appointment with care management team member scheduled for: November 2022      Medication Assistance:  trulicity, basaglar, humalgo obtained through [place] [employer]  medication assistance program.  Enrollment ends 09/25/21  Patient's preferred pharmacy is:  [facility] [place], [place] [address] Phone: [phone] Fax: [facility], [place] - [address] [place] [address] [place] [address] Phone: [phone] Fax: [phone]   Follow Up:  Patient agrees to Care Plan and Follow-up.  Plan: Telephone follow up appointment with care management team member scheduled for:  07/2021  Regina Eck, PharmD, BCPS Clinical Pharmacist, [place]  II Phone [phone]

## 2021-05-27 ENCOUNTER — Encounter: Payer: Self-pay | Admitting: Family Medicine

## 2021-05-27 ENCOUNTER — Ambulatory Visit (INDEPENDENT_AMBULATORY_CARE_PROVIDER_SITE_OTHER): Payer: Medicare Other | Admitting: Family Medicine

## 2021-05-27 ENCOUNTER — Other Ambulatory Visit: Payer: Self-pay

## 2021-05-27 VITALS — BP 127/82 | HR 83 | Temp 98.0°F | Ht 66.0 in | Wt 201.2 lb

## 2021-05-27 DIAGNOSIS — F419 Anxiety disorder, unspecified: Secondary | ICD-10-CM | POA: Diagnosis not present

## 2021-05-27 DIAGNOSIS — F320 Major depressive disorder, single episode, mild: Secondary | ICD-10-CM | POA: Diagnosis not present

## 2021-05-27 DIAGNOSIS — Z23 Encounter for immunization: Secondary | ICD-10-CM | POA: Diagnosis not present

## 2021-05-27 DIAGNOSIS — Z0001 Encounter for general adult medical examination with abnormal findings: Secondary | ICD-10-CM

## 2021-05-27 DIAGNOSIS — Z0000 Encounter for general adult medical examination without abnormal findings: Secondary | ICD-10-CM

## 2021-05-27 NOTE — Patient Instructions (Signed)
Health Maintenance, Female Adopting a healthy lifestyle and getting preventive care are important in promoting health and wellness. Ask your health care provider about: The right schedule for you to have regular tests and exams. Things you can do on your own to prevent diseases and keep yourself healthy. What should I know about diet, weight, and exercise? Eat a healthy diet  Eat a diet that includes plenty of vegetables, fruits, low-fat dairy products, and lean protein. Do not eat a lot of foods that are high in solid fats, added sugars, or sodium. Maintain a healthy weight Body mass index (BMI) is used to identify weight problems. It estimates body fat based on height and weight. Your health care provider can help determine your BMI and help you achieve or maintain a healthy weight. Get regular exercise Get regular exercise. This is one of the most important things you can do for your health. Most adults should: Exercise for at least 150 minutes each week. The exercise should increase your heart rate and make you sweat (moderate-intensity exercise). Do strengthening exercises at least twice a week. This is in addition to the moderate-intensity exercise. Spend less time sitting. Even light physical activity can be beneficial. Watch cholesterol and blood lipids Have your blood tested for lipids and cholesterol at 67 years of age, then have this test every 5 years. Have your cholesterol levels checked more often if: Your lipid or cholesterol levels are high. You are older than 67 years of age. You are at high risk for heart disease. What should I know about cancer screening? Depending on your health history and family history, you may need to have cancer screening at various ages. This may include screening for: Breast cancer. Cervical cancer. Colorectal cancer. Skin cancer. Lung cancer. What should I know about heart disease, diabetes, and high blood pressure? Blood pressure and heart  disease High blood pressure causes heart disease and increases the risk of stroke. This is more likely to develop in people who have high blood pressure readings, are of African descent, or are overweight. Have your blood pressure checked: Every 3-5 years if you are 18-39 years of age. Every year if you are 40 years old or older. Diabetes Have regular diabetes screenings. This checks your fasting blood sugar level. Have the screening done: Once every three years after age 40 if you are at a normal weight and have a low risk for diabetes. More often and at a younger age if you are overweight or have a high risk for diabetes. What should I know about preventing infection? Hepatitis B If you have a higher risk for hepatitis B, you should be screened for this virus. Talk with your health care provider to find out if you are at risk for hepatitis B infection. Hepatitis C Testing is recommended for: Everyone born from 1945 through 1965. Anyone with known risk factors for hepatitis C. Sexually transmitted infections (STIs) Get screened for STIs, including gonorrhea and chlamydia, if: You are sexually active and are younger than 67 years of age. You are older than 67 years of age and your health care provider tells you that you are at risk for this type of infection. Your sexual activity has changed since you were last screened, and you are at increased risk for chlamydia or gonorrhea. Ask your health care provider if you are at risk. Ask your health care provider about whether you are at high risk for HIV. Your health care provider may recommend a prescription medicine   to help prevent HIV infection. If you choose to take medicine to prevent HIV, you should first get tested for HIV. You should then be tested every 3 months for as long as you are taking the medicine. Pregnancy If you are about to stop having your period (premenopausal) and you may become pregnant, seek counseling before you get  pregnant. Take 400 to 800 micrograms (mcg) of folic acid every day if you become pregnant. Ask for birth control (contraception) if you want to prevent pregnancy. Osteoporosis and menopause Osteoporosis is a disease in which the bones lose minerals and strength with aging. This can result in bone fractures. If you are 65 years old or older, or if you are at risk for osteoporosis and fractures, ask your health care provider if you should: Be screened for bone loss. Take a calcium or vitamin D supplement to lower your risk of fractures. Be given hormone replacement therapy (HRT) to treat symptoms of menopause. Follow these instructions at home: Lifestyle Do not use any products that contain nicotine or tobacco, such as cigarettes, e-cigarettes, and chewing tobacco. If you need help quitting, ask your health care provider. Do not use street drugs. Do not share needles. Ask your health care provider for help if you need support or information about quitting drugs. Alcohol use Do not drink alcohol if: Your health care provider tells you not to drink. You are pregnant, may be pregnant, or are planning to become pregnant. If you drink alcohol: Limit how much you use to 0-1 drink a day. Limit intake if you are breastfeeding. Be aware of how much alcohol is in your drink. In the [place] one drink equals one 12 oz bottle of beer (355 mL), one 5 oz glass of wine (148 mL), or one 1 oz glass of hard liquor (44 mL). General instructions Schedule regular health, dental, and eye exams. Stay current with your vaccines. Tell your health care provider if: You often feel depressed. You have ever been abused or do not feel safe at home. Summary Adopting a healthy lifestyle and getting preventive care are important in promoting health and wellness. Follow your health care provider's instructions about healthy diet, exercising, and getting tested or screened for diseases. Follow your health care provider's  instructions on monitoring your cholesterol and blood pressure. This information is not intended to replace advice given to you by your health care provider. Make sure you discuss any questions you have with your health care provider. Document Revised: 11/20/2020 Document Reviewed: 09/05/2018 Elsevier Patient Education  2022 Elsevier Inc.  

## 2021-05-27 NOTE — Progress Notes (Signed)
Subjective:    Rebecca Cook is a 65 y.o. female who presents for a Welcome to Medicare exam.   She has noticed some anxiety and depression lately. 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 She is not currently interested in medication.   Review of Systems Denies chest pain, shortness of breath, edema, dizziness, syncope.  Cardiac Risk Factors include: advanced age (>50mn, >>35women);diabetes mellitus;hypertension;obesity (BMI >30kg/m2)  [facility] 05/27/2021 04/08/2021 01/06/2021  Decreased Interest 1 0 0  Down, Depressed, Hopeless 2 0 0  PHQ - 2 Score 3 0 0  Altered sleeping 2 0 3  Tired, decreased energy 3 0 3  Change in appetite 1 0 1  Feeling bad or failure about yourself  0 0 0  Trouble concentrating 1 0 1  Moving slowly or fidgety/restless 0 0 0  Suicidal thoughts 0 0 0  PHQ-9 Score 10 0 8  Difficult doing work/chores Somewhat difficult - -   GAD 7 : Generalized Anxiety Score 05/27/2021 04/08/2021 01/06/2021  Nervous, Anxious, on Edge 2 0 1  Control/stop worrying 2 0 1  Worry too much - different things 2 0 1  Trouble relaxing 2 0 1  Restless 1 0 0  Easily annoyed or irritable 1 0 0  Afraid - awful might happen 0 0 0  Total GAD 7 Score 10 0 4  Anxiety Difficulty Somewhat difficult - Not difficult at all        Objective:    Today's Vitals   05/27/21 1102  BP: 127/82  Pulse: 83  Temp: 98 F (36.7 C)  TempSrc: Temporal  Weight: 201 lb 4 oz (91.3 kg)  Height: '5\' 6"'$  (1.676 m)  Body mass index is 32.48 kg/m.  Medications Outpatient Encounter Medications as of 05/27/2021  Medication Sig   cetirizine (ZYRTEC) 10 MG tablet Take 1 tablet (10 mg total) by mouth daily.   Dulaglutide (TRULICITY) 1.5 M0000000SOPN Inject 1.5 mg into the skin once a week.   estradiol (ESTRACE VAGINAL) 0.1 MG/GM vaginal cream Place 1 Applicatorful vaginally 3 (three) times a week.   gabapentin (NEURONTIN) 300 MG capsule Take 1  capsule (300 mg total) by mouth 3 (three) times daily.   Insulin Glargine (BASAGLAR KWIKPEN) 100 UNIT/ML Inject 32 Units into the skin at bedtime. (Patient taking differently: Inject 10 Units into the skin at bedtime.)   insulin lispro (HUMALOG KWIKPEN) 100 UNIT/ML KwikPen Before each meal, 3x daily. For blood sugar of 140-199: 2 units, 200-250: 4 units, 251-299: 8 units, 300-349: 10 units, for 350 or above: 14 units. (Patient taking differently: 10 Units. Before each meal, 3x daily. For blood sugar of 140-199: 2 units, 200-250: 4 units, 251-299: 8 units, 300-349: 10 units, for 350 or above: 14 units.)   losartan-hydrochlorothiazide (HYZAAR) 100-25 MG tablet TAKE 1 TABLET BY MOUTH DAILY   metFORMIN (GLUCOPHAGE-XR) 500 MG 24 hr tablet Take 2 tablets (1,000 mg total) by mouth 2 (two) times daily with a meal.   Multiple Vitamins-Minerals (MULTIVITAMIN PO) Take 1 tablet by mouth daily.    rosuvastatin (CRESTOR) 10 MG tablet Take 1 tablet (10 mg total) by mouth daily.   UNABLE TO FIND Collagen Peptide - 1 scoop qd   [DISCONTINUED] valACYclovir (VALTREX) 1000 MG tablet Take 1,000 mg by mouth 2 (two) times daily.   No facility-administered encounter medications on file as of 05/27/2021.     History: Past Medical History:  Diagnosis  Date   Diabetes mellitus, type II ([place])    Frequent headaches    GERD (gastroesophageal reflux disease)    Hypertension    Kidney stones    None current, last had kidney stones in 1986   Past Surgical History:  Procedure Laterality Date   TONSILLECTOMY AND ADENOIDECTOMY  1985   TUBAL LIGATION  1985    Family History  Problem Relation Age of Onset   Cancer Mother        Lung   Heart disease Father    Hypertension Father    Heart disease Paternal Grandmother        Thyroid   Hypertension Paternal Grandmother    Cancer - Other Brother    Diabetes Maternal Grandfather    Social History   Occupational History    Employer: [employer]  Tobacco Use    Smoking status: Former   Smokeless tobacco: Never  Substance and Sexual Activity   Alcohol use: Yes    Alcohol/week: 0.0 standard drinks    Comment: occ   Drug use: No   Sexual activity: Yes    Birth control/protection: None    Tobacco Counseling Counseling given: No   Immunizations and Health Maintenance Immunization History  Administered Date(s) Administered   Influenza,inj,Quad PF,6+ Mos 10/17/2017, 06/05/2019   Influenza-Unspecified 07/21/2014   Moderna Sars-Covid-2 Vaccination 12/09/2019, 12/29/2019, 08/29/2020   Pneumococcal Conjugate-13 03/16/2020   Tdap 03/16/2020   Health Maintenance Due  Topic Date Due   Zoster Vaccines- Shingrix (1 of 2) Never done   COLONOSCOPY (Pts 45-43yr Insurance coverage will need to be confirmed)  09/26/2020   PNA vac Low Risk Adult (2 of 2 - PPSV23) 03/16/2021    Activities of Daily Living In your present state of health, do you have any difficulty performing the following activities: 05/27/2021  Hearing? N  Vision? N  Comment had cataract surgery this year  Difficulty concentrating or making decisions? Y  Comment has difficulty with concentrating especially while she is working from home  Walking or climbing stairs? N  Dressing or bathing? N  Doing errands, shopping? N  Preparing Food and eating ? N  Using the Toilet? N  In the past six months, have you accidently leaked urine? Y  Comment rarely when she sneezes  Do you have problems with loss of bowel control? N  Managing your Medications? N  Managing your Finances? N  Housekeeping or managing your Housekeeping? N  Some recent data might be hidden    Physical Exam  EKG today: NSR  (optional), or other factors deemed appropriate based on the beneficiary's medical and social history and current clinical standards.  Advanced Directives: Does Patient Have a Medical Advance Directive?: No Would patient like information on creating a medical advance directive?: Yes  (MAU/Ambulatory/Procedural Areas - Information given)    Assessment:    This is a routine wellness examination for this patient . She will establish with a therapist through work for anxiety and depression. She declined medication today. Pneumonia vaccine given today. She will check with her insurance regarding shingles vaccine.   Vision/Hearing screen No results found.  Dietary issues and exercise activities discussed:  Current Exercise Habits: Home exercise routine, Type of exercise: walking, Time (Minutes): 30, Frequency (Times/Week): 5, Weekly Exercise (Minutes/Week): 150, Intensity: Mild, Exercise limited by: None identified   Goals       DIET - REDUCE SUGAR INTAKE      T2DM (pt-stated)      Current Barriers:  Unable to independently afford treatment regimen--MEDICARE COVERAGE GAP, HIGH COPAYS Unable to achieve control of T2DM  Suboptimal therapeutic regimen for T2DM   Pharmacist Clinical Goal(s):  Over the next 90 days, patient will verbalize ability to afford treatment regimen achieve control of T2DM as evidenced by GOAL A1C<7%, CONTROLLED BLOOD SUGARS adhere to prescribed medication regimen as evidenced by IMPROVED GLYCEMIC CONTROL through collaboration with PharmD and provider.    Interventions: 1:1 collaboration with Gwenlyn Perking, FNP regarding development and update of comprehensive plan of care as evidenced by provider attestation and co-signature Inter-disciplinary care team collaboration (see longitudinal plan of care) Comprehensive medication review performed; medication list updated in electronic medical record  Diabetes: Uncontrolled (improving); current treatment: BASAGLAR 10units, HUMALOG 10 units w/ most meals; TRULICITY 1.'5mg'$  (wednesdays), metformin BGs are trending down, but still elevated for patient's goal Patient uses dexcom for CGM CONTINUE trulicity 1.'5mg'$  sq weekly Enrolled in [place] [facility] patient assistance (until 09/25/21) meds ship to patient's  house (trulicity, Medical illustrator) Denies personal and family history of Medullary thyroid cancer (MTC) TOLERATING WELL DECREASE Basaglar TO 10 UNITS nightly DECREASE Humalog TO 5-7 UNITS WITH MEALS CONTINUE metformin C-peptide- level 3.0 post prandial-indicative of T2DM (BUT NOT A DEFINITE) Endocrine recommends labs to see if T1 Vs T2-->ZnT8, GAD, insulin antibodies--NOT COVERED ON INSURANCE, WILL POSTPONE THESE LABS AND ASSESS RESPONSE FROM GLP1 THERAPY Current glucose readings: fasting glucose: 120-145s, post prandial glucose: <180 reports hyperglycemic symptoms Discussed meal planning options and Plate method for healthy eating (30-45 cabs per meal, 2x15g snacks) Avoid sugary drinks and desserts Incorporate balanced protein, non starchy veggies, 1 serving of carbohydrate with each meal Increase water intake Increase physical activity as able Current exercise: n/a Educated on diet, medication adjustments Assessed patient finances. Enrolled patient in [place] [employer] for basaglar and humalog insulin pens--patient approved for basaglar/humalog/trulicity until [id number] ship to patient's home  Patient Goals/Self-Care Activities Over the next 90 days, patient will:  - take medications as prescribed check glucose CONTINUOUSLY USING CGM, document, and provide at future appointments engage in dietary modifications by [place] METHOD  Follow Up Plan: Telephone follow up appointment with care management team member scheduled for: November 2022        Depression Screen PHQ 2/9 Scores 05/27/2021 04/08/2021 01/06/2021 08/17/2020  PHQ - 2 Score 3 0 0 0  PHQ- 9 Score 10 0 8 -     Fall Risk Fall Risk  05/27/2021  Falls in the past year? 1  Number falls in past yr: 0  Injury with Fall? 1  Comment -  Risk for fall due to : History of fall(s)  Follow up Falls evaluation completed    Cognitive Function: MMSE - Mini Mental State Exam 05/27/2021   Orientation to time 5  Orientation to Place 5  Registration 3  Attention/ Calculation 5  Recall 2  Language- name 2 objects 2  Language- repeat 1  Language- follow 3 step command 3  Language- read & follow direction 1  Write a sentence 1  Copy design 1  Total score 29        Patient Care Team: Gwenlyn Perking, FNP as PCP - General (Family Medicine) Gala Romney Cristopher Estimable, MD as Consulting Physician (Gastroenterology) Lavera Guise, [facility] as Pharmacist (Family Medicine)     Plan:     Rebecca Cook was seen today for medicare wellness.  Diagnoses and all orders for this visit:  Medicare welcome exam Pneumonia vaccine given today. She will check  with her insurance regarding shingles vaccine.  -     EKG 12-Lead -     Pneumococcal polysaccharide vaccine 23-valent greater than or equal to 2yo subcutaneous/IM  Current mild episode of major depressive disorder without prior episode ([place]) Anxiety She will establish with a therapist through work for anxiety and depression. She declined medication today.   Encounter for Medicare annual wellness exam   I have personally reviewed and noted the following in the patient's chart:   Medical and social history Use of alcohol, tobacco or illicit drugs  Current medications and supplements Functional ability and status Nutritional status Physical activity Advanced directives List of other physicians Hospitalizations, surgeries, and ER visits in previous 12 months Vitals Screenings to include cognitive, depression, and falls Referrals and appointments  In addition, I have reviewed and discussed with patient certain preventive protocols, quality metrics, and best practice recommendations. A written personalized care plan for preventive services as well as general preventive health recommendations were provided to patient.     Gwenlyn Perking, FNP 05/27/2021

## 2021-06-17 ENCOUNTER — Other Ambulatory Visit: Payer: Self-pay | Admitting: Family Medicine

## 2021-06-17 ENCOUNTER — Encounter: Payer: Self-pay | Admitting: Family Medicine

## 2021-06-17 DIAGNOSIS — G4701 Insomnia due to medical condition: Secondary | ICD-10-CM

## 2021-06-17 MED ORDER — TRAZODONE HCL 50 MG PO TABS: 25.0000 mg | ORAL_TABLET | Freq: Every evening | ORAL | 3 refills | Status: DC | PRN

## 2021-06-30 ENCOUNTER — Telehealth: Payer: Self-pay | Admitting: Family Medicine

## 2021-06-30 ENCOUNTER — Ambulatory Visit (INDEPENDENT_AMBULATORY_CARE_PROVIDER_SITE_OTHER): Payer: Medicare Other | Admitting: Family Medicine

## 2021-06-30 ENCOUNTER — Other Ambulatory Visit: Payer: Self-pay

## 2021-06-30 ENCOUNTER — Encounter: Payer: Self-pay | Admitting: Family Medicine

## 2021-06-30 VITALS — BP 133/81 | HR 99 | Temp 98.4°F | Ht 66.0 in | Wt 204.0 lb

## 2021-06-30 DIAGNOSIS — F320 Major depressive disorder, single episode, mild: Secondary | ICD-10-CM | POA: Insufficient documentation

## 2021-06-30 DIAGNOSIS — E1159 Type 2 diabetes mellitus with other circulatory complications: Secondary | ICD-10-CM

## 2021-06-30 DIAGNOSIS — E782 Mixed hyperlipidemia: Secondary | ICD-10-CM | POA: Diagnosis not present

## 2021-06-30 DIAGNOSIS — E0821 Diabetes mellitus due to underlying condition with diabetic nephropathy: Secondary | ICD-10-CM

## 2021-06-30 DIAGNOSIS — I10 Essential (primary) hypertension: Secondary | ICD-10-CM | POA: Diagnosis not present

## 2021-06-30 DIAGNOSIS — E785 Hyperlipidemia, unspecified: Secondary | ICD-10-CM

## 2021-06-30 DIAGNOSIS — Z794 Long term (current) use of insulin: Secondary | ICD-10-CM

## 2021-06-30 DIAGNOSIS — E1169 Type 2 diabetes mellitus with other specified complication: Secondary | ICD-10-CM | POA: Diagnosis not present

## 2021-06-30 DIAGNOSIS — I152 Hypertension secondary to endocrine disorders: Secondary | ICD-10-CM

## 2021-06-30 DIAGNOSIS — E1165 Type 2 diabetes mellitus with hyperglycemia: Secondary | ICD-10-CM | POA: Diagnosis not present

## 2021-06-30 DIAGNOSIS — E119 Type 2 diabetes mellitus without complications: Secondary | ICD-10-CM | POA: Diagnosis not present

## 2021-06-30 DIAGNOSIS — F419 Anxiety disorder, unspecified: Secondary | ICD-10-CM

## 2021-06-30 LAB — BAYER DCA HB A1C WAIVED: HB A1C (BAYER DCA - WAIVED): 7 % — ABNORMAL HIGH (ref 4.8–5.6)

## 2021-06-30 NOTE — Progress Notes (Signed)
Established Patient Office Visit  Subjective:  Patient ID: Rebecca Cook, female    DOB: 1956-03-26  Age: 65 y.o. MRN: [id number]  CC:  Chief Complaint  Patient presents with   Medical Management of Chronic Issues   Diabetes   Hypertension   Hyperlipidemia    HPI Rebecca Cook presents for chronic follow up.   DM Patient denies foot ulcerations, hypoglycemia , nausea, paresthesia of the feet, visual disturbances, vomiting, and weight loss.  Current diabetic medications include trulicity 1.5 with no side effects, metformin 1000 mg BID, humalog 10 units 3x a day, and basaglar 10 mg at night Compliant with meds - Yes  Current monitoring regimen:  dexcom Home blood sugar records: fasting range: low 200s, postprandial: low 200s. She has had 3 numbers randomly at 345.  She is getting alarms every night that her blood sugar in the 250s. She has made sure her dexcom is calibrated and has verified elevated reading with a fingerstick.  Any episodes of hypoglycemia? no  Weight trend: stable Current diet: diabetic  She feels fatigued and like she has no energy.   2. HTN Complaint with meds - Yes Current Medications - losartan-hctz Pertinent ROS:  Visual Disturbances - No Chest pain - No Dyspnea - No Palpitations - No LE edema - No  BP Readings from Last 3 Encounters:  06/30/21 133/81  05/27/21 127/82  04/08/21 (!) 142/80   3. Anxiety, depression She has been doing some counseling through work. She has been taking trazodone and this has helped her sleep significantly.  She is not interested currently in trying medication for anxiety and depression. She has had bad experiences with this in the past. She believes she will feel better once her blood sugars are under control again.   Depression screen [facility] 2/9 06/30/2021 05/27/2021 04/08/2021  Decreased Interest 1 1 0  Down, Depressed, Hopeless 1 2 0  PHQ - 2 Score 2 3 0  Altered sleeping 1 2 0  Tired, decreased energy 3 3 0   Change in appetite 1 1 0  Feeling bad or failure about yourself  0 0 0  Trouble concentrating 2 1 0  Moving slowly or fidgety/restless 0 0 0  Suicidal thoughts 0 0 0  PHQ-9 Score 9 10 0  Difficult doing work/chores Somewhat difficult Somewhat difficult -   GAD 7 : Generalized Anxiety Score 06/30/2021 05/27/2021 04/08/2021 01/06/2021  Nervous, Anxious, on Edge 2 2 0 1  Control/stop worrying 2 2 0 1  Worry too much - different things 2 2 0 1  Trouble relaxing 2 2 0 1  Restless 0 1 0 0  Easily annoyed or irritable 1 1 0 0  Afraid - awful might happen 0 0 0 0  Total GAD 7 Score 9 10 0 4  Anxiety Difficulty Somewhat difficult Somewhat difficult - Not difficult at all     Past Medical History:  Diagnosis Date   Diabetes mellitus, type II (HCC)    Frequent headaches    GERD (gastroesophageal reflux disease)    Hypertension    Kidney stones    None current, last had kidney stones in 1986    Past Surgical History:  Procedure Laterality Date   TONSILLECTOMY AND ADENOIDECTOMY  1985   TUBAL LIGATION  1985    Family History  Problem Relation Age of Onset   Cancer Mother        Lung   Heart disease Father    Hypertension Father  Heart disease Paternal Grandmother        Thyroid   Hypertension Paternal Grandmother    Cancer - Other Brother    Diabetes Maternal Grandfather     Social History   Socioeconomic History   Marital status: Divorced    Spouse name: Not on file   Number of children: 2   Years of education: College   Highest education level: Associate degree: academic program  Occupational History    Employer: [employer]  Tobacco Use   Smoking status: Former   Smokeless tobacco: Never  Substance and Sexual Activity   Alcohol use: Yes    Alcohol/week: 0.0 standard drinks    Comment: occ   Drug use: No   Sexual activity: Yes    Birth control/protection: None  Other Topics Concern   Not on file  Social History Narrative   Not on file   Social  Determinants of Health   Financial Resource Strain: Low Risk    Difficulty of Paying Living Expenses: Not very hard  Food Insecurity: No Food Insecurity   Worried About Charity fundraiser in the Last Year: Never true   Ran Out of Food in the Last Year: Never true  Transportation Needs: No Transportation Needs   Lack of Transportation (Medical): No   Lack of Transportation (Non-Medical): No  Physical Activity: Sufficiently Active   Days of Exercise per Week: 5 days   Minutes of Exercise per Session: 40 min  Stress: Stress Concern Present   Feeling of Stress : To some extent  Social Connections: Moderately Isolated   Frequency of Communication with Friends and Family: More than three times a week   Frequency of Social Gatherings with Friends and Family: Once a week   Attends Religious Services: More than 4 times per year   Active Member of [employer] or Organizations: No   Attends Archivist Meetings: Never   Marital Status: Divorced  Human resources officer Violence: Not At Risk   Fear of Current or Ex-Partner: No   Emotionally Abused: No   Physically Abused: No   Sexually Abused: No    Outpatient Medications Prior to Visit  Medication Sig Dispense Refill   cetirizine (ZYRTEC) 10 MG tablet Take 1 tablet (10 mg total) by mouth daily. 30 tablet 11   Dulaglutide (TRULICITY) 1.5 FG/1.8EX SOPN Inject 1.5 mg into the skin once a week.     estradiol (ESTRACE VAGINAL) 0.1 MG/GM vaginal cream Place 1 Applicatorful vaginally 3 (three) times a week. 42.5 g 12   gabapentin (NEURONTIN) 300 MG capsule Take 1 capsule (300 mg total) by mouth 3 (three) times daily. 270 capsule 1   Insulin Glargine (BASAGLAR KWIKPEN) 100 UNIT/ML Inject 32 Units into the skin at bedtime. (Patient taking differently: Inject 10 Units into the skin at bedtime.) 45 mL 3   insulin lispro (HUMALOG KWIKPEN) 100 UNIT/ML KwikPen Before each meal, 3x daily. For blood sugar of 140-199: 2 units, 200-250: 4 units, 251-299: 8  units, 300-349: 10 units, for 350 or above: 14 units. (Patient taking differently: 10 Units. Before each meal, 3x daily. For blood sugar of 140-199: 2 units, 200-250: 4 units, 251-299: 8 units, 300-349: 10 units, for 350 or above: 14 units.) 15 mL 1   losartan-hydrochlorothiazide (HYZAAR) 100-25 MG tablet TAKE 1 TABLET BY MOUTH DAILY 90 tablet 1   metFORMIN (GLUCOPHAGE-XR) 500 MG 24 hr tablet Take 2 tablets (1,000 mg total) by mouth 2 (two) times daily with a meal. 120 tablet  2   Multiple Vitamins-Minerals (MULTIVITAMIN PO) Take 1 tablet by mouth daily.      rosuvastatin (CRESTOR) 10 MG tablet Take 1 tablet (10 mg total) by mouth daily. 90 tablet 3   traZODone (DESYREL) 50 MG tablet Take 0.5-1 tablets (25-50 mg total) by mouth at bedtime as needed for sleep. 30 tablet 3   UNABLE TO FIND Collagen Peptide - 1 scoop qd     No facility-administered medications prior to visit.    No Known Allergies  ROS Review of Systems As per HPI.    Objective:    Physical Exam Vitals and nursing note reviewed.  Constitutional:      General: She is not in acute distress.    Appearance: She is not ill-appearing, toxic-appearing or diaphoretic.  Cardiovascular:     Rate and Rhythm: Normal rate and regular rhythm.     Heart sounds: Normal heart sounds. No murmur heard. Pulmonary:     Effort: Pulmonary effort is normal. No respiratory distress.     Breath sounds: Normal breath sounds.  Musculoskeletal:     Right lower leg: No edema.     Left lower leg: No edema.  Skin:    General: Skin is warm and dry.  Neurological:     General: No focal deficit present.     Mental Status: She is alert and oriented to person, place, and time.  Psychiatric:        Mood and Affect: Mood normal.        Behavior: Behavior normal.    BP 133/81   Pulse 99   Temp 98.4 F (36.9 C) (Temporal)   Ht $R'5\' 6"'Ru$  (1.676 m)   Wt 204 lb (92.5 kg)   BMI 32.93 kg/m  Wt Readings from Last 3 Encounters:  06/30/21 204 lb (92.5  kg)  05/27/21 201 lb 4 oz (91.3 kg)  04/08/21 208 lb (94.3 kg)     There are no preventive care reminders to display for this patient.  There are no preventive care reminders to display for this patient.  Lab Results  Component Value Date   TSH 2.11 10/17/2017   Lab Results  Component Value Date   WBC 9.0 04/08/2021   HGB 14.3 04/08/2021   HCT 43.5 04/08/2021   MCV 80 04/08/2021   PLT 257 04/08/2021   Lab Results  Component Value Date   NA 141 04/08/2021   K 4.5 04/08/2021   CO2 26 04/08/2021   GLUCOSE 139 (H) 04/08/2021   BUN 16 04/08/2021   CREATININE 0.69 04/08/2021   BILITOT 0.4 04/08/2021   ALKPHOS 80 04/08/2021   AST 11 04/08/2021   ALT 10 04/08/2021   PROT 7.0 04/08/2021   ALBUMIN 4.3 04/08/2021   CALCIUM 9.7 04/08/2021   ANIONGAP 12 02/19/2020   EGFR 96 04/08/2021   GFR 94.70 03/02/2020   Lab Results  Component Value Date   CHOL 145 04/08/2021   Lab Results  Component Value Date   HDL 41 04/08/2021   Lab Results  Component Value Date   LDLCALC 79 04/08/2021   Lab Results  Component Value Date   TRIG 144 04/08/2021   Lab Results  Component Value Date   CHOLHDL 3.5 04/08/2021   Lab Results  Component Value Date   HGBA1C 7.4 (H) 04/08/2021      Assessment & Plan:   Aarilyn was seen today for medical management of chronic issues, diabetes, hypertension and hyperlipidemia.  Diagnoses and all orders for this visit:  Type 2 diabetes mellitus with hyperglycemia, with long-term current use of insulin (HCC) A1c 7.0 today, improved from 7.4. However blood sugars has been ranging 200-300 for last few weeks. Discussed increasing trulicity to 3 mg weekly. Will CC chart to Almyra Free as this is ordered through [facility]. Discussed in the meantime to increase meal coverage insulin to 12 units and basal insulin to 12 units. Labs pending. On statin and ARB. She will be getting vaccines at pharmacy soon.  -     CBC with Differential/Platelet -     CMP14+EGFR -      Lipid panel -     Bayer DCA Hb A1c Waived  Hypertension associated with diabetes ([place]) Well controlled on current regimen.  -     CBC with Differential/Platelet -     CMP14+EGFR -     Lipid panel  Hyperlipidemia associated with type 2 diabetes mellitus ([place]) On statin. Labs pending.  -     Lipid panel  Current mild episode of major depressive disorder without prior episode (HCC) Anxiety Stable. Receiving counseling. Declined medication today.   Follow-up: Return in about 2 weeks (around 07/14/2021) for with Almyra Free for DM, 3 months for chronic follow up and labs.  The patient indicates understanding of these issues and agrees with the plan.    Gwenlyn Perking, FNP

## 2021-06-30 NOTE — Telephone Encounter (Signed)
Pt needs an appt with Almyra Free in 2 weeks. Please call back to schedule.

## 2021-06-30 NOTE — Patient Instructions (Addendum)
Take 12 units at mealtimes and 12 units at bedtime.   Diabetes Mellitus and Nutrition, Adult When you have diabetes, or diabetes mellitus, it is very important to have healthy eating habits because your blood sugar (glucose) levels are greatly affected by what you eat and drink. Eating healthy foods in the right amounts, at about the same times every day, can help you: Control your blood glucose. Lower your risk of heart disease. Improve your blood pressure. Reach or maintain a healthy weight. What can affect my meal plan? Every person with diabetes is different, and each person has different needs for a meal plan. Your health care provider may recommend that you work with a dietitian to make a meal plan that is best for you. Your meal plan may vary depending on factors such as: The calories you need. The medicines you take. Your weight. Your blood glucose, blood pressure, and cholesterol levels. Your activity level. Other health conditions you have, such as heart or kidney disease. How do carbohydrates affect me? Carbohydrates, also called carbs, affect your blood glucose level more than any other type of food. Eating carbs naturally raises the amount of glucose in your blood. Carb counting is a method for keeping track of how many carbs you eat. Counting carbs is important to keep your blood glucose at a healthy level, especially if you use insulin or take certain oral diabetes medicines. It is important to know how many carbs you can safely have in each meal. This is different for every person. Your dietitian can help you calculate how many carbs you should have at each meal and for each snack. How does alcohol affect me? Alcohol can cause a sudden decrease in blood glucose (hypoglycemia), especially if you use insulin or take certain oral diabetes medicines. Hypoglycemia can be a life-threatening condition. Symptoms of hypoglycemia, such as sleepiness, dizziness, and confusion, are similar to  symptoms of having too much alcohol. Do not drink alcohol if: Your health care provider tells you not to drink. You are pregnant, may be pregnant, or are planning to become pregnant. If you drink alcohol: Do not drink on an empty stomach. Limit how much you use to: 0-1 drink a day for women. 0-2 drinks a day for men. Be aware of how much alcohol is in your drink. In the [place] one drink equals one 12 oz bottle of beer (355 mL), one 5 oz glass of wine (148 mL), or one 1 oz glass of hard liquor (44 mL). Keep yourself hydrated with water, diet soda, or unsweetened iced tea. Keep in mind that regular soda, juice, and other mixers may contain a lot of sugar and must be counted as carbs. What are tips for following this plan? Reading food labels Start by checking the serving size on the "Nutrition Facts" label of packaged foods and drinks. The amount of calories, carbs, fats, and other nutrients listed on the label is based on one serving of the item. Many items contain more than one serving per package. Check the total grams (g) of carbs in one serving. You can calculate the number of servings of carbs in one serving by dividing the total carbs by 15. For example, if a food has 30 g of total carbs per serving, it would be equal to 2 servings of carbs. Check the number of grams (g) of saturated fats and trans fats in one serving. Choose foods that have a low amount or none of these fats. Check the number of  milligrams (mg) of salt (sodium) in one serving. Most people should limit total sodium intake to less than 2,300 mg per day. Always check the nutrition information of foods labeled as "low-fat" or "nonfat." These foods may be higher in added sugar or refined carbs and should be avoided. Talk to your dietitian to identify your daily goals for nutrients listed on the label. Shopping Avoid buying canned, pre-made, or processed foods. These foods tend to be high in fat, sodium, and added sugar. Shop  around the outside edge of the grocery store. This is where you will most often find fresh fruits and vegetables, bulk grains, fresh meats, and fresh dairy. Cooking Use low-heat cooking methods, such as baking, instead of high-heat cooking methods like deep frying. Cook using healthy oils, such as olive, canola, or sunflower oil. Avoid cooking with butter, cream, or high-fat meats. Meal planning Eat meals and snacks regularly, preferably at the same times every day. Avoid going long periods of time without eating. Eat foods that are high in fiber, such as fresh fruits, vegetables, beans, and whole grains. Talk with your dietitian about how many servings of carbs you can eat at each meal. Eat 4-6 oz (112-168 g) of lean protein each day, such as lean meat, chicken, fish, eggs, or tofu. One ounce (oz) of lean protein is equal to: 1 oz (28 g) of meat, chicken, or fish. 1 egg.  cup (62 g) of tofu. Eat some foods each day that contain healthy fats, such as avocado, nuts, seeds, and fish. What foods should I eat? Fruits Berries. Apples. Oranges. Peaches. Apricots. Plums. Grapes. Mango. Papaya. Pomegranate. Kiwi. Cherries. Vegetables Lettuce. Spinach. Leafy greens, including kale, chard, collard greens, and mustard greens. Beets. Cauliflower. Cabbage. Broccoli. Carrots. [place] beans. Tomatoes. Peppers. Onions. Cucumbers. Brussels sprouts. Grains Whole grains, such as whole-wheat or whole-grain bread, crackers, tortillas, cereal, and pasta. Unsweetened oatmeal. Quinoa. Brown or wild rice. Meats and other proteins Seafood. Poultry without skin. Lean cuts of poultry and beef. Tofu. Nuts. Seeds. Dairy Low-fat or fat-free dairy products such as milk, yogurt, and cheese. The items listed above may not be a complete list of foods and beverages you can eat. Contact a dietitian for more information. What foods should I avoid? Fruits Fruits canned with syrup. Vegetables Canned vegetables. Frozen  vegetables with butter or cream sauce. Grains Refined white flour and flour products such as bread, pasta, snack foods, and cereals. Avoid all processed foods. Meats and other proteins Fatty cuts of meat. Poultry with skin. Breaded or fried meats. Processed meat. Avoid saturated fats. Dairy Full-fat yogurt, cheese, or milk. Beverages Sweetened drinks, such as soda or iced tea. The items listed above may not be a complete list of foods and beverages you should avoid. Contact a dietitian for more information. Questions to ask a health care provider Do I need to meet with a diabetes educator? Do I need to meet with a dietitian? What number can I call if I have questions? When are the best times to check my blood glucose? Where to find more information: American Diabetes Association: diabetes.org Academy of Nutrition and Dietetics: www.eatright.[employer] of Diabetes and Digestive and Kidney Diseases: [employer] Association of Diabetes Care and Education Specialists: www.diabeteseducator.org Summary It is important to have healthy eating habits because your blood sugar (glucose) levels are greatly affected by what you eat and drink. A healthy meal plan will help you control your blood glucose and maintain a healthy lifestyle. Your health care provider may recommend  that you work with a dietitian to make a meal plan that is best for you. Keep in mind that carbohydrates (carbs) and alcohol have immediate effects on your blood glucose levels. It is important to count carbs and to use alcohol carefully. This information is not intended to replace advice given to you by your health care provider. Make sure you discuss any questions you have with your health care provider. Document Revised: 08/20/2019 Document Reviewed: 08/20/2019 Elsevier Patient Education  2021 [employer].

## 2021-07-01 LAB — CMP14+EGFR
ALT: 18 IU/L (ref 0–32)
AST: 24 IU/L (ref 0–40)
Albumin/Globulin Ratio: 1.6 (ref 1.2–2.2)
Albumin: 4.3 g/dL (ref 3.8–4.8)
Alkaline Phosphatase: 73 IU/L (ref 44–121)
BUN/Creatinine Ratio: 21 (ref 12–28)
BUN: 20 mg/dL (ref 8–27)
Bilirubin Total: 0.5 mg/dL (ref 0.0–1.2)
CO2: 23 mmol/L (ref 20–29)
Calcium: 9.3 mg/dL (ref 8.7–10.3)
Chloride: 100 mmol/L (ref 96–106)
Creatinine, Ser: 0.97 mg/dL (ref 0.57–1.00)
Globulin, Total: 2.7 g/dL (ref 1.5–4.5)
Glucose: 232 mg/dL — ABNORMAL HIGH (ref 70–99)
Potassium: 3.9 mmol/L (ref 3.5–5.2)
Sodium: 140 mmol/L (ref 134–144)
Total Protein: 7 g/dL (ref 6.0–8.5)
eGFR: 64 mL/min/{1.73_m2} (ref >59)

## 2021-07-01 LAB — CBC WITH DIFFERENTIAL/PLATELET
Basophils Absolute: 0.1 10*3/uL (ref 0.0–0.2)
Basos: 1 %
EOS (ABSOLUTE): 0.3 10*3/uL (ref 0.0–0.4)
Eos: 3 %
Hematocrit: 43.2 % (ref 34.0–46.6)
Hemoglobin: 14.4 g/dL (ref 11.1–15.9)
Immature Grans (Abs): 0 10*3/uL (ref 0.0–0.1)
Immature Granulocytes: 0 %
Lymphocytes Absolute: 2.3 10*3/uL (ref 0.7–3.1)
Lymphs: 26 %
MCH: 27.5 pg (ref 26.6–33.0)
MCHC: 33.3 g/dL (ref 31.5–35.7)
MCV: 82 fL (ref 79–97)
Monocytes Absolute: 0.6 10*3/uL (ref 0.1–0.9)
Monocytes: 7 %
Neutrophils Absolute: 5.4 10*3/uL (ref 1.4–7.0)
Neutrophils: 63 %
Platelets: 260 10*3/uL (ref 150–450)
RBC: 5.24 x10E6/uL (ref 3.77–5.28)
RDW: 14.7 % (ref 11.7–15.4)
WBC: 8.6 10*3/uL (ref 3.4–10.8)

## 2021-07-01 LAB — LIPID PANEL
Chol/HDL Ratio: 4 ratio (ref 0.0–4.4)
Cholesterol, Total: 141 mg/dL (ref 100–199)
HDL: 35 mg/dL — ABNORMAL LOW (ref >39)
LDL Chol Calc (NIH): 69 mg/dL (ref 0–99)
Triglycerides: 228 mg/dL — ABNORMAL HIGH (ref 0–149)
VLDL Cholesterol Cal: 37 mg/dL (ref 5–40)

## 2021-07-06 NOTE — Telephone Encounter (Signed)
Pt has been scheduled for 07/21/2021. Pt states that she only has 2 weeks of medication left

## 2021-07-09 ENCOUNTER — Ambulatory Visit: Payer: Medicare Other | Admitting: Family Medicine

## 2021-07-14 NOTE — Telephone Encounter (Signed)
Pt has been notified by detailed message

## 2021-07-14 NOTE — Telephone Encounter (Signed)
Jena Gauss,  Patient states that she tried to call and refill but they would not refill due to the dosage changing to 3.0 instead of 1.5mg . Pt states that they advised her the office would have to call in new RX.   Thank you   Noreene Larsson, [address], [place], [place] [address] Direct Dial: [phone] [email or web address]: [email or web address]

## 2021-07-21 ENCOUNTER — Ambulatory Visit (INDEPENDENT_AMBULATORY_CARE_PROVIDER_SITE_OTHER): Payer: Medicare Other | Admitting: Pharmacist

## 2021-07-21 DIAGNOSIS — Z794 Long term (current) use of insulin: Secondary | ICD-10-CM

## 2021-07-21 NOTE — Progress Notes (Signed)
Chronic Care Management Pharmacy Note  07/21/2021 Name:  Rebecca Cook MRN:  [id number] DOB:  10/27/1955  Summary: T2DM  Recommendations/Changes made from today's visit: Diabetes: Uncontrolled (improving); current treatment: BASAGLAR 10 units daily, HUMALOG 10 units w/ most meals; TRULICITY 3mg  (wednesdays), metformin BGs are trending down, but still elevated for patient's goal Patient uses dexcom for CGM CONTINUE trulicity 3mg  sq weekly (recent increase has been on for 3 weeks--new rx faxed to [facility] on 07/21/21) Give time for body to adjust to 3mg  dose--> ~2-3 months before making another change Enrolled in [place] cares patient assistance (until 09/25/21) meds ship to patient's house (trulicity, basaglar, humalog) Denies personal and family history of Medullary thyroid cancer (MTC) TOLERATING WELL CONTINUEBasaglar TO 10 UNITS nightly CONTINUE Humalog TO 5-7 UNITS WITH MEALS (has gone up to 10 units) CONTINUE metformin C-peptide- level 3.0 post prandial-indicative of T2DM (BUT NOT A DEFINITE) Endocrine recommends labs to see if T1 Vs T2-->ZnT8, GAD, insulin antibodies--NOT COVERED ON INSURANCE, WILL POSTPONE THESE LABS AND ASSESS RESPONSE FROM GLP1 THERAPY New dexcom was given to patient by insurance--patient's malfunctioned  Current glucose readings: fasting glucose: 120-145s, post prandial glucose: 150-250 reports hyperglycemic symptoms Discussed meal planning options and Plate method for healthy eating (30-45 cabs per meal, 2x15g snacks) Avoid sugary drinks and desserts Incorporate balanced protein, non starchy veggies, 1 serving of carbohydrate with each meal Increase water intake Increase physical activity as able Current exercise: n/a Educated on diet, medication adjustments Assessed patient finances. [place] in [employer] for basaglar and humalog insulin pens--patient approved for basaglar/humalog/trulicity until 29/93/71-IRCV ship to patient's  home  Follow Up Plan: Telephone follow up appointment with care management team member scheduled for: 08/2021  Subjective: Rebecca Cook is an 65 y.o. year old female who is a primary patient of Gwenlyn Perking, [place].  The CCM team was consulted for assistance with disease management and care coordination needs.    Engaged with patient by telephone for follow up visit in response to provider referral for pharmacy case management and/or care coordination services.   Consent to Services:  The patient was given information about Chronic Care Management services, agreed to services, and gave verbal consent prior to initiation of services.  Please see initial visit note for detailed documentation.   Patient Care Team: Gwenlyn Perking, FNP as PCP - General (Family Medicine) Gala Romney, Cristopher Estimable, MD as Consulting Physician (Gastroenterology) Lavera Guise, [facility] as Pharmacist (Family Medicine)   Objective:  Lab Results  Component Value Date   CREATININE 0.97 06/30/2021   CREATININE 0.69 04/08/2021   CREATININE 0.75 01/06/2021    Lab Results  Component Value Date   HGBA1C 7.0 (H) 06/30/2021   Last diabetic Eye exam: No results found for: HMDIABEYEEXA  Last diabetic Foot exam: No results found for: HMDIABFOOTEX      Component Value Date/Time   CHOL 141 06/30/2021 1603   TRIG 228 (H) 06/30/2021 1603   HDL 35 (L) 06/30/2021 1603   CHOLHDL 4.0 06/30/2021 1603   CHOLHDL 6 10/17/2017 1534   VLDL 67.0 (H) 10/17/2017 1534   LDLCALC 69 06/30/2021 1603   LDLDIRECT 195.0 10/17/2017 1534    Hepatic Function Latest Ref Rng & Units 06/30/2021 04/08/2021 01/06/2021  Total Protein 6.0 - 8.5 g/dL 7.0 7.0 7.2  Albumin 3.8 - 4.8 g/dL 4.3 4.3 4.2  AST 0 - 40 IU/L 24 11 15   ALT 0 - 32 IU/L 18 10 16   Alk Phosphatase 44 -  121 IU/L 73 80 80  Total Bilirubin 0.0 - 1.2 mg/dL 0.5 0.4 0.4  Bilirubin, Direct 0.0 - 0.3 mg/dL - - -    Lab Results  Component Value Date/Time   TSH 2.11 10/17/2017 03:34  PM   TSH 1.94 09/11/2014 07:44 AM    CBC Latest Ref Rng & Units 06/30/2021 04/08/2021 01/06/2021  WBC 3.4 - 10.8 x10E3/uL 8.6 9.0 8.1  Hemoglobin 11.1 - 15.9 g/dL 14.4 14.3 15.1  Hematocrit 34.0 - 46.6 % 43.2 43.5 45.4  Platelets 150 - 450 x10E3/uL 260 257 251    No results found for: VD25OH  Clinical ASCVD: No  The 10-year ASCVD risk score (Arnett DK, et al., 2019) is: 16.6%   Values used to calculate the score:     Age: 23 years     Sex: Female     Is Non-Hispanic African American: No     Diabetic: Yes     Tobacco smoker: No     Systolic Blood Pressure: 102 mmHg     Is BP treated: Yes     HDL Cholesterol: 35 mg/dL     Total Cholesterol: 141 mg/dL    Other: (CHADS2VASc if Afib, PHQ9 if depression, MMRC or CAT for COPD, ACT, DEXA)  Social History   Tobacco Use  Smoking Status Former  Smokeless Tobacco Never   BP Readings from Last 3 Encounters:  06/30/21 133/81  05/27/21 127/82  04/08/21 (!) 142/80   Pulse Readings from Last 3 Encounters:  06/30/21 99  05/27/21 83  04/08/21 88   Wt Readings from Last 3 Encounters:  06/30/21 204 lb (92.5 kg)  05/27/21 201 lb 4 oz (91.3 kg)  04/08/21 208 lb (94.3 kg)    Assessment: Review of patient past medical history, allergies, medications, health status, including review of consultants reports, laboratory and other test data, was performed as part of comprehensive evaluation and provision of chronic care management services.   SDOH:  (Social Determinants of Health) assessments and interventions performed:    CCM Care Plan  No Known Allergies  Medications Reviewed Today     Reviewed by Lavera Guise, [facility] (Pharmacist) on 07/22/21 at 0856  Med List Status: <None>   Medication Order Taking? Sig Documenting Provider Last Dose Status Informant  cetirizine (ZYRTEC) 10 MG tablet [id number] No Take 1 tablet (10 mg total) by mouth daily. Gwenlyn Perking, FNP Taking Active   Discontinued 07/22/21 [phone] (Change in therapy)    estradiol (ESTRACE VAGINAL) 0.1 MG/GM vaginal cream [id number] No Place 1 Applicatorful vaginally 3 (three) times a week. Gwenlyn Perking, FNP Taking Active   gabapentin (NEURONTIN) 300 MG capsule [id number] No Take 1 capsule (300 mg total) by mouth 3 (three) times daily. Gwenlyn Perking, FNP Taking Active   Insulin Glargine [facility] KWIKPEN) 100 UNIT/ML [id number] No Inject 32 Units into the skin at bedtime.  Patient taking differently: Inject 10 Units into the skin at bedtime.   Gwenlyn Perking, FNP Taking Active            Med Note Parthenia Ames Mar 25, 2021  9:23 AM) Gets via [employer] patient assistance   insulin lispro (HUMALOG KWIKPEN) 100 UNIT/ML KwikPen [id number] No Before each meal, 3x daily. For blood sugar of 140-199: 2 units, 200-250: 4 units, 251-299: 8 units, 300-349: 10 units, for 350 or above: 14 units.  Patient taking differently: 10 Units. Before each meal, 3x daily. For blood sugar of 140-199: 2 units, 200-250:  4 units, 251-299: 8 units, 300-349: 10 units, for 350 or above: 14 units.   Gwenlyn Perking, FNP Taking Active            Med Note Parthenia Ames Mar 25, 2021  9:23 AM) Marguerite Olea via [place] cares patient assistance  losartan-hydrochlorothiazide [facility]) 100-25 MG tablet [id number] No TAKE 1 TABLET BY MOUTH DAILY Gwenlyn Perking, FNP Taking Active   metFORMIN (GLUCOPHAGE-XR) 500 MG 24 hr tablet [id number] No Take 2 tablets (1,000 mg total) by mouth 2 (two) times daily with a meal. Gwenlyn Perking, FNP Taking Active   Multiple Vitamins-Minerals (MULTIVITAMIN PO) [id number] No Take 1 tablet by mouth daily.  [provider] Taking Active Pharmacy Records  rosuvastatin (CRESTOR) 10 MG tablet [id number] No Take 1 tablet (10 mg total) by mouth daily. Gwenlyn Perking, FNP Taking Active   traZODone (DESYREL) 50 MG tablet [id number] No Take 0.5-1 tablets (25-50 mg total) by mouth at bedtime as needed for sleep. 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 associated with diabetes mellitus due to underlying condition ([place]) 05/14/2021   Neuropathy 01/06/2021   Uterine leiomyoma 06/18/2020   DKA, type 2 ([place]) 02/16/2020   Hyperkalemia 02/16/2020   Hypercalcemia 54/62/7035   Acute metabolic encephalopathy 00/93/8182   AKI (acute kidney injury) ([place]) 02/16/2020   Shingles of eyelid 02/16/2020   Hypertensive urgency 02/16/2020   Hidradenitis axillaris 08/31/2016   Visit for screening mammogram 08/31/2016   Impingement syndrome of right shoulder 08/12/2016   Anxiety 11/19/2015   Hyperlipidemia 09/02/2014   Type 2 diabetes mellitus with hyperglycemia, with long-term current use of insulin ([place]) 11/22/2013   HTN (hypertension) 11/22/2013    Immunization History  Administered Date(s) Administered   Influenza,inj,Quad PF,6+ Mos 10/17/2017, 06/05/2019   Influenza-Unspecified 07/21/2014   Moderna Sars-Covid-2 Vaccination 12/09/2019, 12/29/2019, 08/29/2020   Pneumococcal Conjugate-13 03/16/2020   Pneumococcal Polysaccharide-23 05/27/2021   Tdap 03/16/2020    Conditions to be addressed/monitored: DMII  Care Plan : PHARMD MEDICATION MANAGEMENT  Updates made by Lavera Guise, [place] since 07/22/2021 12:00 AM     Problem: DISEASE PROGRESSION PREVENTION      Long-Range Goal: T2DM   Recent Progress: On track  Priority: High  Note:   Current Barriers:  Unable to independently afford treatment regimen--MEDICARE COVERAGE GAP, HIGH COPAYS Unable to achieve control of T2DM  Suboptimal therapeutic regimen for T2DM   Pharmacist Clinical  Goal(s):  Over the next 90 days, patient will verbalize ability to afford treatment regimen achieve control of T2DM as evidenced by GOAL A1C<7%, CONTROLLED BLOOD SUGARS adhere to prescribed medication regimen as evidenced by IMPROVED GLYCEMIC CONTROL  through collaboration with PharmD and provider.    Interventions: 1:1 collaboration with Gwenlyn Perking, FNP regarding development and update of comprehensive plan of care as evidenced by provider attestation and co-signature Inter-disciplinary care team collaboration (see longitudinal plan of care) Comprehensive medication review performed; medication list updated in electronic medical record  Diabetes: Uncontrolled (improving); current treatment: BASAGLAR 10 units daily, HUMALOG 10 units w/ most meals;  TRULICITY 3mg  (wednesdays), metformin BGs are trending down, but still elevated for patient's goal Patient uses dexcom for CGM CONTINUE trulicity 3mg  sq weekly (recent increase has been on for 3 weeks--new rx faxed to [place] on 07/21/21) Give time for body to adjust to 3mg  dose--> ~2-3 months before making another change Enrolled in [place] cares patient assistance (until 09/25/21) meds ship to patient's house (trulicity, basaglar, humalog) Denies personal and family history of Medullary thyroid cancer (MTC) TOLERATING WELL CONTINUEBasaglar TO 10 UNITS nightly CONTINUE Humalog TO 5-7 UNITS WITH MEALS (has gone up to 10 units) CONTINUE metformin C-peptide- level 3.0 post prandial-indicative of T2DM (BUT NOT A DEFINITE) Endocrine recommends labs to see if T1 Vs T2-->ZnT8, GAD, insulin antibodies--NOT COVERED ON INSURANCE, WILL POSTPONE THESE LABS AND ASSESS RESPONSE FROM GLP1 THERAPY New dexcom was given to patient by insurance--patient's malfunctioned  Current glucose readings: fasting glucose: 120-145s, post prandial glucose: 150-250 reports hyperglycemic symptoms Discussed meal planning options and Plate method for healthy eating (30-45  cabs per meal, 2x15g snacks) Avoid sugary drinks and desserts Incorporate balanced protein, non starchy veggies, 1 serving of carbohydrate with each meal Increase water intake Increase physical activity as able Current exercise: n/a Educated on diet, medication adjustments Assessed patient finances. Enrolled patient in [employer] for basaglar and humalog insulin pens--patient approved for basaglar/humalog/trulicity until 73/41/93-XTKW ship to patient's home  Patient Goals/Self-Care Activities Over the next 90 days, patient will:  - take medications as prescribed check glucose CONTINUOUSLY USING CGM, document, and provide at future appointments engage in dietary modifications by [place] METHOD  Follow Up Plan: Telephone follow up appointment with care management team member scheduled for: 08/2021      Medication Assistance:  Marva Panda, AND HUMALOG obtained through [place] medication assistance program.  Enrollment ends 08/2021  Patient's preferred pharmacy is:  [facility], [place] [address] Phone: [phone] Fax: [id number] - [place], [place] - [address] [place] [address] [place] [address] Phone: [phone] Fax: [phone]  [facility] [place], [place] - [address] [place] [address] Phone: [phone] Fax: [phone]   Follow Up:  Patient agrees to Care Plan and Follow-up.  Plan: Telephone follow up appointment with care management team member scheduled for:  2 months   Regina Eck, PharmD, BCPS Clinical Pharmacist, [place]  II Phone [phone]

## 2021-07-22 MED ORDER — TRULICITY 3 MG/0.5ML ~~LOC~~ SOAJ: 3.0000 mg | SUBCUTANEOUS | 3 refills | Status: DC

## 2021-07-22 NOTE — Patient Instructions (Signed)
Visit Information  PATIENT GOALS:  Goals Addressed               This Visit's Progress     Patient Stated     T2DM (pt-stated)        Current Barriers:  Unable to independently afford treatment regimen--MEDICARE COVERAGE GAP, HIGH COPAYS Unable to achieve control of T2DM  Suboptimal therapeutic regimen for T2DM   Pharmacist Clinical Goal(s):  Over the next 90 days, patient will verbalize ability to afford treatment regimen achieve control of T2DM as evidenced by GOAL A1C<7%, CONTROLLED BLOOD SUGARS adhere to prescribed medication regimen as evidenced by IMPROVED GLYCEMIC CONTROL through collaboration with PharmD and provider.    Interventions: 1:1 collaboration with Gwenlyn Perking, FNP regarding development and update of comprehensive plan of care as evidenced by provider attestation and co-signature Inter-disciplinary care team collaboration (see longitudinal plan of care) Comprehensive medication review performed; medication list updated in electronic medical record  Diabetes: Uncontrolled (improving); current treatment: BASAGLAR 10 units daily, HUMALOG 10 units w/ most meals; TRULICITY 3mg  (wednesdays), metformin BGs are trending down, but still elevated for patient's goal Patient uses dexcom for CGM CONTINUE trulicity 3mg  sq weekly (recent increase has been on for 3 weeks--new rx faxed to [facility] on 07/21/21) Give time for body to adjust to 3mg  dose--> ~2-3 months before making another change Enrolled in [place] cares patient assistance (until 09/25/21) meds ship to patient's house (trulicity, basaglar, humalog) Denies personal and family history of Medullary thyroid cancer (MTC) TOLERATING WELL CONTINUEBasaglar TO 10 UNITS nightly CONTINUE Humalog TO 5-7 UNITS WITH MEALS (has gone up to 10 units) CONTINUE metformin C-peptide- level 3.0 post prandial-indicative of T2DM (BUT NOT A DEFINITE) Endocrine recommends labs to see if T1 Vs T2-->ZnT8, GAD, insulin antibodies--NOT  COVERED ON INSURANCE, WILL POSTPONE THESE LABS AND ASSESS RESPONSE FROM GLP1 THERAPY New dexcom was given to patient by insurance--patient's malfunctioned  Current glucose readings: fasting glucose: 120-145s, post prandial glucose: 150-250 reports hyperglycemic symptoms Discussed meal planning options and Plate method for healthy eating (30-45 cabs per meal, 2x15g snacks) Avoid sugary drinks and desserts Incorporate balanced protein, non starchy veggies, 1 serving of carbohydrate with each meal Increase water intake Increase physical activity as able Current exercise: n/a Educated on diet, medication adjustments Assessed patient finances. Enrolled patient in [employer] for basaglar and humalog insulin pens--patient approved for basaglar/humalog/trulicity until 73/53/29-JMEQ ship to patient's home  Patient Goals/Self-Care Activities Over the next 90 days, patient will:  - take medications as prescribed check glucose CONTINUOUSLY USING CGM, document, and provide at future appointments engage in dietary modifications by [place] METHOD  Follow Up Plan: Telephone follow up appointment with care management team member scheduled for: 08/2021         The patient verbalized understanding of instructions, educational materials, and care plan provided today and declined offer to receive copy of patient instructions, educational materials, and care plan.   Telephone follow up appointment with care management team member scheduled for: 08/2021  Signature Regina Eck, PharmD, BCPS Clinical Pharmacist, [place]  II Phone [phone]

## 2021-07-26 DIAGNOSIS — E1165 Type 2 diabetes mellitus with hyperglycemia: Secondary | ICD-10-CM | POA: Diagnosis not present

## 2021-07-26 DIAGNOSIS — Z794 Long term (current) use of insulin: Secondary | ICD-10-CM

## 2021-08-25 ENCOUNTER — Ambulatory Visit (INDEPENDENT_AMBULATORY_CARE_PROVIDER_SITE_OTHER): Payer: Medicare Other | Admitting: Pharmacist

## 2021-08-25 DIAGNOSIS — Z794 Long term (current) use of insulin: Secondary | ICD-10-CM

## 2021-08-25 DIAGNOSIS — E1165 Type 2 diabetes mellitus with hyperglycemia: Secondary | ICD-10-CM

## 2021-08-25 DIAGNOSIS — E1159 Type 2 diabetes mellitus with other circulatory complications: Secondary | ICD-10-CM

## 2021-08-25 DIAGNOSIS — I152 Hypertension secondary to endocrine disorders: Secondary | ICD-10-CM

## 2021-08-25 NOTE — Patient Instructions (Signed)
Visit Information  Thank you for taking time to visit with me today. Please don't hesitate to contact me if I can be of assistance to you before our next scheduled telephone appointment.  Following are the goals we discussed today:  Current Barriers:  Unable to independently afford treatment regimen--MEDICARE COVERAGE GAP, HIGH COPAYS Unable to achieve control of T2DM  Suboptimal therapeutic regimen for T2DM   Pharmacist Clinical Goal(s):  Over the next 90 days, patient will verbalize ability to afford treatment regimen achieve control of T2DM as evidenced by GOAL A1C<7%, CONTROLLED BLOOD SUGARS adhere to prescribed medication regimen as evidenced by IMPROVED GLYCEMIC CONTROL through collaboration with PharmD and provider.    Interventions: 1:1 collaboration with Gwenlyn Perking, FNP regarding development and update of comprehensive plan of care as evidenced by provider attestation and co-signature Inter-disciplinary care team collaboration (see longitudinal plan of care) Comprehensive medication review performed; medication list updated in electronic medical record  Diabetes: Uncontrolled (improving); current treatment: BASAGLAR 10 units daily, HUMALOG 15 units w/ most meals; TRULICITY 3mg  (wednesdays), metformin BGs are trending down--USING LESS INSULIN Patient uses dexcom for CGM CONTINUE trulicity 3mg  sq weekly (recent increase) RE-Enrolled in [place] cares patient assistance (until 09/25/22) meds ship to patient's house (trulicity, basaglar, humalog) Denies personal and family history of Medullary thyroid cancer (MTC) TOLERATING WELL CAN INCREASE TO 4.5MG  SQ WEEKLY AS TOLERATED CONTINUE Basaglar TO 10 UNITS nightly CONTINUE Humalog TO 15 UNITS WITH MEALS  CONTINUE metformin WOULD LIKE TO TRANSITION PATIENT TO [place], BUT WAITING UNTIL [employer] ADDS TO PATIENT ASSISTANCE C-peptide- level 3.0 post prandial-indicative of T2DM (BUT NOT A DEFINITE) Endocrine recommends labs  to see if T1 Vs T2-->ZnT8, GAD, insulin antibodies--NOT COVERED ON INSURANCE, WILL POSTPONE THESE LABS AND ASSESS RESPONSE FROM GLP1 THERAPY New dexcom was given to patient by insurance--patient's malfunctioned  Current glucose readings: fasting glucose: 120-145s, post prandial glucose: 150-250 reports hyperglycemic symptoms Discussed meal planning options and Plate method for healthy eating (30-45 cabs per meal, 2x15g snacks) Avoid sugary drinks and desserts Incorporate balanced protein, non starchy veggies, 1 serving of carbohydrate with each meal Increase water intake Increase physical activity as able Current exercise: n/a Educated on diet, medication adjustments Assessed patient finances. Enrolled patient in [employer] for basaglar and humalog insulin pens--patient approved for basaglar/humalog/trulicity until 29/56/21-HYQM ship to patient's home  Patient Goals/Self-Care Activities Over the next 90 days, patient will:  - take medications as prescribed check glucose CONTINUOUSLY USING CGM, document, and provide at future appointments engage in dietary modifications by [place] METHOD  Follow Up Plan: Telephone follow up appointment with care management team member scheduled for: 09/2021   Please call the care guide team at [phone] if you need to cancel or reschedule your appointment.  Patient verbalizes understanding of instructions provided today and agrees to view in [place].   Signature Regina Eck, PharmD, BCPS Clinical Pharmacist, [place]  II Phone [phone]

## 2021-08-25 NOTE — Progress Notes (Signed)
Chronic Care Management Pharmacy Note  08/25/2021 Name:  Rebecca Cook MRN:  [id number] DOB:  1955/11/21  Summary: T2DM  Recommendations/Changes made from today's visit:  Diabetes: Uncontrolled (improving); current treatment: BASAGLAR 10 units daily, HUMALOG 15 units w/ most meals; TRULICITY 3mg  (wednesdays), metformin BGs are trending down--USING LESS INSULIN Patient uses dexcom for CGM CONTINUE trulicity 3mg  sq weekly (recent increase) RE-Enrolled in [place] cares patient assistance (until 09/25/22) meds ship to patient's house (trulicity, basaglar, humalog) Denies personal and family history of Medullary thyroid cancer (MTC) TOLERATING WELL CAN INCREASE TO 4.5MG  SQ WEEKLY AS TOLERATED CONTINUE Basaglar TO 10 UNITS nightly CONTINUE Humalog TO 15 UNITS WITH MEALS  CONTINUE metformin WOULD LIKE TO TRANSITION PATIENT TO MOUNJARO, BUT WAITING UNTIL [employer] ADDS TO PATIENT ASSISTANCE C-peptide- level 3.0 post prandial-indicative of T2DM (BUT NOT A DEFINITE) Endocrine recommends labs to see if T1 Vs T2-->ZnT8, GAD, insulin antibodies--NOT COVERED ON INSURANCE, WILL POSTPONE THESE LABS AND ASSESS RESPONSE FROM GLP1 THERAPY New dexcom was given to patient by insurance--patient's malfunctioned  Current glucose readings: fasting glucose: 120-145s, post prandial glucose: 150-200 (IMPROVED) reports hyperglycemic symptoms Discussed meal planning options and Plate method for healthy eating (30-45 cabs per meal, 2x15g snacks) Avoid sugary drinks and desserts Incorporate balanced protein, non starchy veggies, 1 serving of carbohydrate with each meal Increase water intake Increase physical activity as able Current exercise: n/a Educated on diet, medication adjustments Assessed patient finances. Enrolled patient in [employer] for basaglar and humalog insulin pens--patient approved for basaglar/humalog/trulicity until 76/28/31-DVVO ship to patient's home  Patient  Goals/Self-Care Activities Over the next 90 days, patient will:  - take medications as prescribed check glucose CONTINUOUSLY USING CGM, document, and provide at future appointments engage in dietary modifications by [place] METHOD  Follow Up Plan: Telephone follow up appointment with care management team member scheduled for: 09/2021  Subjective: Rebecca Cook is an 65 y.o. year old female who is a primary patient of Gwenlyn Perking, FNP.  The CCM team was consulted for assistance with disease management and care coordination needs.    Engaged with patient by telephone for follow up visit in response to provider referral for pharmacy case management and/or care coordination services.   Consent to Services:  The patient was given information about Chronic Care Management services, agreed to services, and gave verbal consent prior to initiation of services.  Please see initial visit note for detailed documentation.   Patient Care Team: Gwenlyn Perking, FNP as PCP - General (Family Medicine) Gala Romney, Cristopher Estimable, MD as Consulting Physician (Gastroenterology) Lavera Guise, [facility] as Pharmacist (Family Medicine)  Objective:  Lab Results  Component Value Date   CREATININE 0.97 06/30/2021   CREATININE 0.69 04/08/2021   CREATININE 0.75 01/06/2021    Lab Results  Component Value Date   HGBA1C 7.0 (H) 06/30/2021   Last diabetic Eye exam: No results found for: HMDIABEYEEXA  Last diabetic Foot exam: No results found for: HMDIABFOOTEX      Component Value Date/Time   CHOL 141 06/30/2021 1603   TRIG 228 (H) 06/30/2021 1603   HDL 35 (L) 06/30/2021 1603   CHOLHDL 4.0 06/30/2021 1603   CHOLHDL 6 10/17/2017 1534   VLDL 67.0 (H) 10/17/2017 1534   LDLCALC 69 06/30/2021 1603   LDLDIRECT 195.0 10/17/2017 1534    Hepatic Function Latest Ref Rng & Units 06/30/2021 04/08/2021 01/06/2021  Total Protein 6.0 - 8.5 g/dL 7.0 7.0 7.2  Albumin 3.8 -  4.8 g/dL 4.3 4.3 4.2   AST 0 - 40 IU/L $Remov'24 11 15  'mmDaoW$ ALT 0 - 32 IU/L $Remov'18 10 16  'szThcc$ Alk Phosphatase 44 - 121 IU/L 73 80 80  Total Bilirubin 0.0 - 1.2 mg/dL 0.5 0.4 0.4  Bilirubin, Direct 0.0 - 0.3 mg/dL - - -    Lab Results  Component Value Date/Time   TSH 2.11 10/17/2017 03:34 PM   TSH 1.94 09/11/2014 07:44 AM    CBC Latest Ref Rng & Units 06/30/2021 04/08/2021 01/06/2021  WBC 3.4 - 10.8 x10E3/uL 8.6 9.0 8.1  Hemoglobin 11.1 - 15.9 g/dL 14.4 14.3 15.1  Hematocrit 34.0 - 46.6 % 43.2 43.5 45.4  Platelets 150 - 450 x10E3/uL 260 257 251    No results found for: VD25OH  Clinical ASCVD: No  The 10-year ASCVD risk score (Arnett DK, et al., 2019) is: 16.6%   Values used to calculate the score:     Age: 65 years     Sex: Female     Is Non-Hispanic African American: No     Diabetic: Yes     Tobacco smoker: No     Systolic Blood Pressure: 370 mmHg     Is BP treated: Yes     HDL Cholesterol: 35 mg/dL     Total Cholesterol: 141 mg/dL    Other: (CHADS2VASc if Afib, PHQ9 if depression, MMRC or CAT for COPD, ACT, DEXA)  Social History   Tobacco Use  Smoking Status Former  Smokeless Tobacco Never   BP Readings from Last 3 Encounters:  06/30/21 133/81  05/27/21 127/82  04/08/21 (!) 142/80   Pulse Readings from Last 3 Encounters:  06/30/21 99  05/27/21 83  04/08/21 88   Wt Readings from Last 3 Encounters:  06/30/21 204 lb (92.5 kg)  05/27/21 201 lb 4 oz (91.3 kg)  04/08/21 208 lb (94.3 kg)    Assessment: Review of patient past medical history, allergies, medications, health status, including review of consultants reports, laboratory and other test data, was performed as part of comprehensive evaluation and provision of chronic care management services.   SDOH:  (Social Determinants of Health) assessments and interventions performed:    CCM Care Plan  No Known Allergies  Medications Reviewed Today     Reviewed by Lavera Guise, [facility] (Pharmacist) on 08/25/21 at 1451  Med List Status: <None>    Medication Order Taking? Sig Documenting Provider Last Dose Status Informant  cetirizine (ZYRTEC) 10 MG tablet [id number] No Take 1 tablet (10 mg total) by mouth daily. Gwenlyn Perking, FNP Taking Active   Dulaglutide (TRULICITY) 3 HW/3.8UE [facility] [id number]  Inject 3 mg as directed once a week. Gwenlyn Perking, FNP  Active            Med Note Parthenia Ames Jul 22, 2021  8:57 AM) Via [employer] patient assistance program  estradiol (ESTRACE VAGINAL) 0.1 MG/GM vaginal cream [id number] No Place 1 Applicatorful vaginally 3 (three) times a week. Gwenlyn Perking, FNP Taking Active   gabapentin (NEURONTIN) 300 MG capsule [id number] No Take 1 capsule (300 mg total) by mouth 3 (three) times daily. Gwenlyn Perking, FNP Taking Active   Insulin Glargine [facility] KWIKPEN) 100 UNIT/ML [id number] No Inject 32 Units into the skin at bedtime.  Patient taking differently: Inject 10 Units into the skin at bedtime.   Gwenlyn Perking, FNP Taking Active            Med Note (Ladine Kiper  D   Thu Mar 25, 2021  9:23 AM) Gets via [facility] [employer] patient assistance   insulin lispro (HUMALOG KWIKPEN) 100 UNIT/ML KwikPen [id number] No Before each meal, 3x daily. For blood sugar of 140-199: 2 units, 200-250: 4 units, 251-299: 8 units, 300-349: 10 units, for 350 or above: 14 units.  Patient taking differently: 10 Units. Before each meal, 3x daily. For blood sugar of 140-199: 2 units, 200-250: 4 units, 251-299: 8 units, 300-349: 10 units, for 350 or above: 14 units.   Gwenlyn Perking, FNP Taking Active            Med Note Parthenia Ames Mar 25, 2021  9:23 AM) Marguerite Olea via [place] cares patient assistance  losartan-hydrochlorothiazide [facility]) 100-25 MG tablet [id number] No TAKE 1 TABLET BY MOUTH DAILY Gwenlyn Perking, FNP Taking Active   metFORMIN (GLUCOPHAGE-XR) 500 MG 24 hr tablet [id number] No Take 2 tablets (1,000 mg total) by mouth 2 (two) times daily with a meal. Gwenlyn Perking, FNP Taking Active    Multiple Vitamins-Minerals (MULTIVITAMIN PO) [id number] No Take 1 tablet by mouth daily.  [provider] Taking Active Pharmacy Records  rosuvastatin (CRESTOR) 10 MG tablet [id number] No Take 1 tablet (10 mg total) by mouth daily. Gwenlyn Perking, FNP Taking Active   traZODone (DESYREL) 50 MG tablet [id number] No Take 0.5-1 tablets (25-50 mg total) by mouth at bedtime as needed for sleep. 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 associated with diabetes mellitus due to underlying condition ([place]) 05/14/2021   Neuropathy 01/06/2021   Uterine leiomyoma 06/18/2020   DKA, type 2 ([place]) 02/16/2020   Hyperkalemia 02/16/2020   Hypercalcemia 65/53/7482   Acute metabolic encephalopathy 70/78/6754   AKI (acute kidney injury) ([place]) 02/16/2020   Shingles of eyelid 02/16/2020   Hypertensive urgency 02/16/2020   Hidradenitis axillaris 08/31/2016   Visit for screening mammogram 08/31/2016   Impingement syndrome of right shoulder 08/12/2016   Anxiety 11/19/2015   Hyperlipidemia 09/02/2014   Type 2 diabetes mellitus with hyperglycemia, with long-term current use of insulin ([place]) 11/22/2013   HTN (hypertension) 11/22/2013    Immunization History  Administered Date(s) Administered   Influenza,inj,Quad PF,6+ Mos 10/17/2017, 06/05/2019   Influenza-Unspecified 07/21/2014   Moderna Sars-Covid-2 Vaccination 12/09/2019, 12/29/2019, 08/29/2020   Pneumococcal Conjugate-13 03/16/2020   Pneumococcal Polysaccharide-23 05/27/2021   Tdap 03/16/2020    Conditions to be addressed/monitored: HTN and DMII  Care  Plan : PHARMD MEDICATION MANAGEMENT  Updates made by Lavera Guise, [place] since 08/25/2021 12:00 AM     Problem: DISEASE PROGRESSION PREVENTION      Long-Range Goal: T2DM   Recent Progress: On track  Priority: High  Note:   Current Barriers:  Unable to independently afford treatment regimen--MEDICARE COVERAGE GAP, HIGH COPAYS Unable to achieve control of T2DM  Suboptimal therapeutic regimen for T2DM   Pharmacist Clinical Goal(s):  Over the next 90 days, patient will verbalize ability to afford treatment regimen achieve control of T2DM as evidenced by GOAL A1C<7%, CONTROLLED BLOOD SUGARS adhere to prescribed medication regimen as  evidenced by IMPROVED GLYCEMIC CONTROL  through collaboration with PharmD and provider.    Interventions: 1:1 collaboration with Gwenlyn Perking, FNP regarding development and update of comprehensive plan of care as evidenced by provider attestation and co-signature Inter-disciplinary care team collaboration (see longitudinal plan of care) Comprehensive medication review performed; medication list updated in electronic medical record  Diabetes: Uncontrolled (improving); current treatment: BASAGLAR 10 units daily, HUMALOG 15 units w/ most meals; TRULICITY 3mg  (wednesdays), metformin BGs are trending down--USING LESS INSULIN Patient uses dexcom for CGM CONTINUE trulicity 3mg  sq weekly (recent increase) RE-Enrolled in [place] cares patient assistance (until 09/25/22) meds ship to patient's house (trulicity, basaglar, humalog) Denies personal and family history of Medullary thyroid cancer (MTC) TOLERATING WELL CAN INCREASE TO 4.5MG  SQ WEEKLY AS TOLERATED CONTINUE Basaglar TO 10 UNITS nightly CONTINUE Humalog TO 15 UNITS WITH MEALS  CONTINUE metformin WOULD LIKE TO TRANSITION PATIENT TO MOUNJARO, BUT WAITING UNTIL [employer] ADDS TO PATIENT ASSISTANCE C-peptide- level 3.0 post prandial-indicative of T2DM (BUT NOT A DEFINITE) Endocrine recommends labs to  see if T1 Vs T2-->ZnT8, GAD, insulin antibodies--NOT COVERED ON INSURANCE, WILL POSTPONE THESE LABS AND ASSESS RESPONSE FROM GLP1 THERAPY New dexcom was given to patient by insurance--patient's malfunctioned  Current glucose readings: fasting glucose: 120-145s, post prandial glucose: 150-250 reports hyperglycemic symptoms Discussed meal planning options and Plate method for healthy eating (30-45 cabs per meal, 2x15g snacks) Avoid sugary drinks and desserts Incorporate balanced protein, non starchy veggies, 1 serving of carbohydrate with each meal Increase water intake Increase physical activity as able Current exercise: n/a Educated on diet, medication adjustments Assessed patient finances. Enrolled patient in [employer] for basaglar and humalog insulin pens--patient approved for basaglar/humalog/trulicity until 94/58/59-YTWK ship to patient's home  Patient Goals/Self-Care Activities Over the next 90 days, patient will:  - take medications as prescribed check glucose CONTINUOUSLY USING CGM, document, and provide at future appointments engage in dietary modifications by [place] METHOD  Follow Up Plan: Telephone follow up appointment with care management team member scheduled for: 09/2021      Medication Assistance:  Raynelle Jan, TRULICITY obtained through [place] medication assistance program.  Enrollment ends 09/25/22  Patient's preferred pharmacy is:  [facility], [place] [address] Phone: [phone] Fax: [id number] - [place], [place] - [address] [place] [place] [address] Phone: [phone] Fax: [phone]  [facility] [place], [place] - [address] [place] [address] Phone: [phone] Fax:  [phone]  Follow Up:  Patient agrees to Care Plan and Follow-up.  Plan: Telephone follow up appointment with care management team member scheduled for:  09/2021  Regina Eck, PharmD, BCPS Clinical Pharmacist, [place]  II Phone [phone]

## 2021-09-08 ENCOUNTER — Encounter: Payer: Self-pay | Admitting: Family Medicine

## 2021-09-08 ENCOUNTER — Ambulatory Visit (INDEPENDENT_AMBULATORY_CARE_PROVIDER_SITE_OTHER): Payer: Medicare Other | Admitting: Family Medicine

## 2021-09-08 VITALS — BP 132/70 | HR 105 | Temp 98.2°F | Ht 66.0 in | Wt 203.0 lb

## 2021-09-08 DIAGNOSIS — R6889 Other general symptoms and signs: Secondary | ICD-10-CM | POA: Diagnosis not present

## 2021-09-08 MED ORDER — BENZONATATE 100 MG PO CAPS: 100.0000 mg | ORAL_CAPSULE | Freq: Three times a day (TID) | ORAL | 0 refills | Status: DC | PRN

## 2021-09-08 NOTE — Progress Notes (Signed)
Acute Office Visit  Subjective:    Patient ID: Rebecca Cook, female    DOB: 19-Aug-1956, 65 y.o.   MRN: [id number]  Chief Complaint  Patient presents with   Facial Pain   Nasal Congestion    HPI Patient is in today for cough, congestion, and fatigue x 3 days. She also reports some nausea. She did have a sore throat, but this has resolved. Denies vomiting or diarrhea. Denies chest pain or shortness of breath. She has been taking tylenol and coricidin with some improvement. She has had a negative home Covid test.   Past Medical History:  Diagnosis Date   Diabetes mellitus, type II ([place])    Frequent headaches    GERD (gastroesophageal reflux disease)    Hypertension    Kidney stones    None current, last had kidney stones in 1986    Past Surgical History:  Procedure Laterality Date   TONSILLECTOMY AND [place]    Family History  Problem Relation Age of Onset   Cancer Mother        Lung   Heart disease Father    Hypertension Father    Heart disease Paternal Grandmother        Thyroid   Hypertension Paternal Grandmother    Cancer - Other Brother    Diabetes Maternal Grandfather     Social History   Socioeconomic History   Marital status: Divorced    Spouse name: Not on file   Number of children: 2   Years of education: College   Highest education level: Associate degree: academic program  Occupational History    Employer: [employer]  Tobacco Use   Smoking status: Former   Smokeless tobacco: Never  Substance and Sexual Activity   Alcohol use: Yes    Alcohol/week: 0.0 standard drinks    Comment: occ   Drug use: No   Sexual activity: Yes    Birth control/protection: None  Other Topics Concern   Not on file  Social History Narrative   Not on file   Social Determinants of Health   Financial Resource Strain: Low Risk    Difficulty of Paying Living Expenses: Not very hard  Food Insecurity: No Food Insecurity    Worried About Charity fundraiser in the Last Year: Never true   Ran Out of Food in the Last Year: Never true  Transportation Needs: No Transportation Needs   Lack of Transportation (Medical): No   Lack of Transportation (Non-Medical): No  Physical Activity: Sufficiently Active   Days of Exercise per Week: 5 days   Minutes of Exercise per Session: 40 min  Stress: Stress Concern Present   Feeling of Stress : To some extent  Social Connections: Moderately Isolated   Frequency of Communication with Friends and Family: More than three times a week   Frequency of Social Gatherings with Friends and Family: Once a week   Attends Religious Services: More than 4 times per year   Active Member of [employer] or Organizations: No   Attends Archivist Meetings: Never   Marital Status: Divorced  Human resources officer Violence: Not At Risk   Fear of Current or Ex-Partner: No   Emotionally Abused: No   Physically Abused: No   Sexually Abused: No    Outpatient Medications Prior to Visit  Medication Sig Dispense Refill   cetirizine (ZYRTEC) 10 MG tablet Take 1 tablet (10 mg total) by mouth daily. Box  tablet 11   Dulaglutide (TRULICITY) 3 VO/5.3GU SOPN Inject 3 mg as directed once a week. 6 mL 3   estradiol (ESTRACE VAGINAL) 0.1 MG/GM vaginal cream Place 1 Applicatorful vaginally 3 (three) times a week. 42.5 g 12   gabapentin (NEURONTIN) 300 MG capsule Take 1 capsule (300 mg total) by mouth 3 (three) times daily. 270 capsule 1   Insulin Glargine (BASAGLAR KWIKPEN) 100 UNIT/ML Inject 32 Units into the skin at bedtime. (Patient taking differently: Inject 10 Units into the skin at bedtime.) 45 mL 3   insulin lispro (HUMALOG KWIKPEN) 100 UNIT/ML KwikPen Before each meal, 3x daily. For blood sugar of 140-199: 2 units, 200-250: 4 units, 251-299: 8 units, 300-349: 10 units, for 350 or above: 14 units. (Patient taking differently: 10 Units. Before each meal, 3x daily. For blood sugar of 140-199: 2 units, 200-250:  4 units, 251-299: 8 units, 300-349: 10 units, for 350 or above: 14 units.) 15 mL 1   losartan-hydrochlorothiazide (HYZAAR) 100-25 MG tablet TAKE 1 TABLET BY MOUTH DAILY 90 tablet 1   metFORMIN (GLUCOPHAGE-XR) 500 MG 24 hr tablet Take 2 tablets (1,000 mg total) by mouth 2 (two) times daily with a meal. 120 tablet 2   Multiple Vitamins-Minerals (MULTIVITAMIN PO) Take 1 tablet by mouth daily.      rosuvastatin (CRESTOR) 10 MG tablet Take 1 tablet (10 mg total) by mouth daily. 90 tablet 3   traZODone (DESYREL) 50 MG tablet Take 0.5-1 tablets (25-50 mg total) by mouth at bedtime as needed for sleep. 30 tablet 3   UNABLE TO FIND Collagen Peptide - 1 scoop qd     No facility-administered medications prior to visit.    No Known Allergies  Review of Systems As per HPI.     Objective:    Physical Exam Vitals and nursing note reviewed.  Constitutional:      General: She is not in acute distress.    Appearance: She is not toxic-appearing or diaphoretic.  HENT:     Head: Normocephalic and atraumatic.     Right Ear: Tympanic membrane, ear canal and external ear normal.     Left Ear: Tympanic membrane, ear canal and external ear normal.     Nose: Congestion present.     Mouth/Throat:     Mouth: Mucous membranes are moist.     Pharynx: Posterior oropharyngeal erythema present. No pharyngeal swelling or oropharyngeal exudate.     Tonsils: No tonsillar exudate or tonsillar abscesses. 1+ on the right. 1+ on the left.  Cardiovascular:     Rate and Rhythm: Normal rate and regular rhythm.     Heart sounds: Normal heart sounds. No murmur heard. Pulmonary:     Effort: Pulmonary effort is normal. No respiratory distress.     Breath sounds: Normal breath sounds. No wheezing, rhonchi or rales.  Chest:     Chest wall: No tenderness.  Abdominal:     General: Bowel sounds are normal.     Palpations: Abdomen is soft.  Musculoskeletal:     Right lower leg: No edema.     Left lower leg: No edema.   Skin:    General: Skin is warm and dry.  Neurological:     General: No focal deficit present.     Mental Status: She is alert and oriented to person, place, and time.  Psychiatric:        Mood and Affect: Mood normal.        Behavior: Behavior normal.  BP 132/70    Pulse (!) 105    Temp 98.2 F (36.8 C) (Temporal)    Ht $R'5\' 6"'FQ$  (1.676 m)    Wt 203 lb (92.1 kg)    SpO2 98%    BMI 32.77 kg/m  Wt Readings from Last 3 Encounters:  09/08/21 203 lb (92.1 kg)  06/30/21 204 lb (92.5 kg)  05/27/21 201 lb 4 oz (91.3 kg)    Health Maintenance Due  Topic Date Due   FOOT EXAM  07/01/2021    There are no preventive care reminders to display for this patient.   Lab Results  Component Value Date   TSH 2.11 10/17/2017   Lab Results  Component Value Date   WBC 8.6 06/30/2021   HGB 14.4 06/30/2021   HCT 43.2 06/30/2021   MCV 82 06/30/2021   PLT 260 06/30/2021   Lab Results  Component Value Date   NA 140 06/30/2021   K 3.9 06/30/2021   CO2 23 06/30/2021   GLUCOSE 232 (H) 06/30/2021   BUN 20 06/30/2021   CREATININE 0.97 06/30/2021   BILITOT 0.5 06/30/2021   ALKPHOS 73 06/30/2021   AST 24 06/30/2021   ALT 18 06/30/2021   PROT 7.0 06/30/2021   ALBUMIN 4.3 06/30/2021   CALCIUM 9.3 06/30/2021   ANIONGAP 12 02/19/2020   EGFR 64 06/30/2021   GFR 94.70 03/02/2020   Lab Results  Component Value Date   CHOL 141 06/30/2021   Lab Results  Component Value Date   HDL 35 (L) 06/30/2021   Lab Results  Component Value Date   LDLCALC 69 06/30/2021   Lab Results  Component Value Date   TRIG 228 (H) 06/30/2021   Lab Results  Component Value Date   CHOLHDL 4.0 06/30/2021   Lab Results  Component Value Date   HGBA1C 7.0 (H) 06/30/2021       Assessment & Plan:   Genecis was seen today for facial pain and nasal congestion.  Diagnoses and all orders for this visit:  Flu-like symptoms Discussed viral cause. She declined flu testing today as she would be out of the window  for tamiflu. Discussed symptomatic care and return precautions.  -     benzonatate (TESSALON PERLES) 100 MG capsule; Take 1 capsule (100 mg total) by mouth 3 (three) times daily as needed for cough.  The patient indicates understanding of these issues and agrees with the plan.  Gwenlyn Perking, FNP

## 2021-09-08 NOTE — Patient Instructions (Signed)

## 2021-09-28 ENCOUNTER — Telehealth: Payer: Self-pay | Admitting: Family Medicine

## 2021-09-28 NOTE — Telephone Encounter (Signed)
CALL RETURNED TO PATIENT  MOUNJARO IS NOT ON [employer] PATIENT ASSISTANCE YET STAY TUNED CONTINUE TRULICITY AND CURRENT REGIMEN FOR NOW

## 2021-10-13 ENCOUNTER — Encounter: Payer: Self-pay | Admitting: Pharmacist

## 2021-10-13 ENCOUNTER — Ambulatory Visit (INDEPENDENT_AMBULATORY_CARE_PROVIDER_SITE_OTHER): Payer: Medicare HMO | Admitting: Pharmacist

## 2021-10-13 VITALS — BP 130/78

## 2021-10-13 DIAGNOSIS — I10 Essential (primary) hypertension: Secondary | ICD-10-CM

## 2021-10-13 DIAGNOSIS — E1165 Type 2 diabetes mellitus with hyperglycemia: Secondary | ICD-10-CM

## 2021-10-13 NOTE — Patient Instructions (Signed)
Visit Information  Following are the goals we discussed today:  Current Barriers:  Unable to independently afford treatment regimen--MEDICARE COVERAGE GAP, HIGH COPAYS Unable to achieve control of T2DM  Suboptimal therapeutic regimen for T2DM   Pharmacist Clinical Goal(s):  Over the next 90 days, patient will verbalize ability to afford treatment regimen achieve control of T2DM as evidenced by GOAL A1C<7%, CONTROLLED BLOOD SUGARS adhere to prescribed medication regimen as evidenced by IMPROVED GLYCEMIC CONTROL through collaboration with PharmD and provider.    Interventions: 1:1 collaboration with Gwenlyn Perking, FNP regarding development and update of comprehensive plan of care as evidenced by provider attestation and co-signature Inter-disciplinary care team collaboration (see longitudinal plan of care) Comprehensive medication review performed; medication list updated in electronic medical record  Diabetes: Controlled (based on CGM readings--will repeat a1c at pcp f/u in 3 months); current treatment: BASAGLAR 10 units daily, HUMALOG 10 units w/ most meals; TRULICITY 3mg  (wednesdays), metformin BGs are trending down--USING LESS INSULIN Patient uses dexcom for CGM CONTINUE Trulicity 3mg  sq weekly RE-Enrolled in [place] cares patient assistance (until 09/25/22) meds ship to patient's house (trulicity, basaglar, humalog) Denies personal and family history of Medullary thyroid cancer (MTC) TOLERATING WELL (was having nausea but better) CAN INCREASE TO 4.5MG  SQ WEEKLY AS TOLERATED CONTINUE Basaglar TO 10 UNITS nightly CONTINUE Humalog TO 10 UNITS WITH MEALS  CONTINUE metformin WOULD LIKE TO TRANSITION PATIENT TO MOUNJARO, BUT WAITING UNTIL [employer] ADDS TO PATIENT ASSISTANCE C-peptide- level 3.0 post prandial-indicative of T2DM (BUT NOT A DEFINITE) Endocrine recommends labs to see if T1 Vs T2-->ZnT8, GAD, insulin antibodies--NOT COVERED ON INSURANCE, WILL POSTPONE THESE LABS AND  ASSESS RESPONSE FROM GLP1 THERAPY New dexcom was given to patient by insurance--patient's malfunctioned  Current glucose readings: fasting glucose: 120-145s, post prandial glucose: 160-170 reports hyperglycemic symptoms Discussed meal planning options and Plate method for healthy eating (30-45 cabs per meal, 2x15g snacks) Avoid sugary drinks and desserts Incorporate balanced protein, non starchy veggies, 1 serving of carbohydrate with each meal Increase water intake Increase physical activity as able Current exercise: n/a Educated on diet, medication adjustments Assessed patient finances. Enrolled patient in [employer] for basaglar and humalog insulin pens--patient approved for basaglar/humalog/trulicity until 57/01/77-LTJQ ship to patient's home  Patient Goals/Self-Care Activities Over the next 90 days, patient will:  - take medications as prescribed check glucose CONTINUOUSLY USING CGM, document, and provide at future appointments engage in dietary modifications by [place] METHOD  Follow Up Plan: Telephone follow up appointment with care management team member scheduled for:  6 months  Signature Regina Eck, PharmD, BCPS Clinical Pharmacist, [place]  II Phone [phone]   Please call the care guide team at [phone] if you need to cancel or reschedule your appointment.   The patient verbalized understanding of instructions, educational materials, and care plan provided today and agreed to receive a mailed copy of patient instructions, educational materials, and care plan.

## 2021-10-13 NOTE — Progress Notes (Signed)
Chronic Care Management Pharmacy Note  10/13/2021 Name:  Rebecca Cook MRN:  [id number] DOB:  December 31, 1955  Summary: t2dm,htn  Recommendations/Changes made from today's visit: Diabetes: Controlled (based on CGM readings--will repeat a1c at pcp f/u in 3 months); current treatment: BASAGLAR 10 units daily, HUMALOG 10 units w/ most meals; TRULICITY 3mg  (wednesdays), metformin BGs are trending down--USING LESS INSULIN Patient uses dexcom for CGM CONTINUE Trulicity 3mg  sq weekly RE-Enrolled in [place] cares patient assistance (until 09/25/22) meds ship to patient's house (trulicity, basaglar, humalog) Denies personal and family history of Medullary thyroid cancer (MTC) TOLERATING WELL (was having nausea but better) CAN INCREASE TO 4.5MG  SQ WEEKLY AS TOLERATED CONTINUE Basaglar TO 10 UNITS nightly CONTINUE Humalog TO 10 UNITS WITH MEALS  CONTINUE metformin WOULD LIKE TO TRANSITION PATIENT TO MOUNJARO, BUT WAITING UNTIL [employer] ADDS TO PATIENT ASSISTANCE C-peptide- level 3.0 post prandial-indicative of T2DM (BUT NOT A DEFINITE) Endocrine recommends labs to see if T1 Vs T2-->ZnT8, GAD, insulin antibodies--NOT COVERED ON INSURANCE, WILL POSTPONE THESE LABS AND ASSESS RESPONSE FROM GLP1 THERAPY New dexcom was given to patient by insurance--patient's malfunctioned  Current glucose readings: fasting glucose: 120-145s, post prandial glucose: 160-170 reports hyperglycemic symptoms Discussed meal planning options and Plate method for healthy eating (30-45 cabs per meal, 2x15g snacks) Avoid sugary drinks and desserts Incorporate balanced protein, non starchy veggies, 1 serving of carbohydrate with each meal Increase water intake Increase physical activity as able Current exercise: n/a Educated on diet, medication adjustments Assessed patient finances. Enrolled patient in [employer] for basaglar and humalog insulin pens--patient approved for basaglar/humalog/trulicity until  07/12/50-WCHE ship to patient's home  Patient Goals/Self-Care Activities Over the next 90 days, patient will:  - take medications as prescribed check glucose CONTINUOUSLY USING CGM, document, and provide at future appointments engage in dietary modifications by [place] METHOD  Follow Up Plan: Telephone follow up appointment with care management team member scheduled for:  6 months  Subjective: Rebecca Cook is an 66 y.o. year old female who is a primary patient of Gwenlyn Perking, [place].  The CCM team was consulted for assistance with disease management and care coordination needs.    Engaged with patient by telephone for follow up visit in response to provider referral for pharmacy case management and/or care coordination services.   Consent to Services:  The patient was given information about Chronic Care Management services, agreed to services, and gave verbal consent prior to initiation of services.  Please see initial visit note for detailed documentation.   Patient Care Team: Gwenlyn Perking, FNP as PCP - General (Family Medicine) Gala Romney Cristopher Estimable, MD as Consulting Physician (Gastroenterology) Lavera Guise, [facility] as Pharmacist (Family Medicine)  Objective:  Lab Results  Component Value Date   CREATININE 0.97 06/30/2021   CREATININE 0.69 04/08/2021   CREATININE 0.75 01/06/2021    Lab Results  Component Value Date   HGBA1C 7.0 (H) 06/30/2021   Last diabetic Eye exam: No results found for: HMDIABEYEEXA  Last diabetic Foot exam: No results found for: HMDIABFOOTEX      Component Value Date/Time   CHOL 141 06/30/2021 1603   TRIG 228 (H) 06/30/2021 1603   HDL 35 (L) 06/30/2021 1603   CHOLHDL 4.0 06/30/2021 1603   CHOLHDL 6 10/17/2017 1534   VLDL 67.0 (H) 10/17/2017 1534   LDLCALC 69 06/30/2021 1603   LDLDIRECT 195.0 10/17/2017 1534    Hepatic Function Latest Ref Rng & Units 06/30/2021 04/08/2021  01/06/2021  Total Protein 6.0 - 8.5 g/dL  7.0 7.0 7.2  Albumin 3.8 - 4.8 g/dL 4.3 4.3 4.2  AST 0 - 40 IU/L $Remov'24 11 15  'lXTFmX$ ALT 0 - 32 IU/L $Remov'18 10 16  'kFTGwW$ Alk Phosphatase 44 - 121 IU/L 73 80 80  Total Bilirubin 0.0 - 1.2 mg/dL 0.5 0.4 0.4  Bilirubin, Direct 0.0 - 0.3 mg/dL - - -    Lab Results  Component Value Date/Time   TSH 2.11 10/17/2017 03:34 PM   TSH 1.94 09/11/2014 07:44 AM    CBC Latest Ref Rng & Units 06/30/2021 04/08/2021 01/06/2021  WBC 3.4 - 10.8 x10E3/uL 8.6 9.0 8.1  Hemoglobin 11.1 - 15.9 g/dL 14.4 14.3 15.1  Hematocrit 34.0 - 46.6 % 43.2 43.5 45.4  Platelets 150 - 450 x10E3/uL 260 257 251    No results found for: VD25OH  Clinical ASCVD: No  The 10-year ASCVD risk score (Arnett DK, et al., 2019) is: 17.5%   Values used to calculate the score:     Age: 72 years     Sex: Female     Is Non-Hispanic African American: No     Diabetic: Yes     Tobacco smoker: No     Systolic Blood Pressure: 599 mmHg     Is BP treated: Yes     HDL Cholesterol: 35 mg/dL     Total Cholesterol: 141 mg/dL    Other: (CHADS2VASc if Afib, PHQ9 if depression, MMRC or CAT for COPD, ACT, DEXA)  Social History   Tobacco Use  Smoking Status Former  Smokeless Tobacco Never   BP Readings from Last 3 Encounters:  10/13/21 130/78  09/08/21 132/70  06/30/21 133/81   Pulse Readings from Last 3 Encounters:  09/08/21 (!) 105  06/30/21 99  05/27/21 83   Wt Readings from Last 3 Encounters:  09/08/21 203 lb (92.1 kg)  06/30/21 204 lb (92.5 kg)  05/27/21 201 lb 4 oz (91.3 kg)    Assessment: Review of patient past medical history, allergies, medications, health status, including review of consultants reports, laboratory and other test data, was performed as part of comprehensive evaluation and provision of chronic care management services.   SDOH:  (Social Determinants of Health) assessments and interventions performed:    CCM Care Plan  No Known Allergies  Medications Reviewed Today     Reviewed by Lavera Guise, [facility] (Pharmacist) on  10/13/21 at 33  Med List Status: <None>   Medication Order Taking? Sig Documenting Provider Last Dose Status Informant    Discontinued 10/13/21 1318 (Completed Course)   cetirizine (ZYRTEC) 10 MG tablet [id number] No Take 1 tablet (10 mg total) by mouth daily. Gwenlyn Perking, FNP Taking Active   Dulaglutide (TRULICITY) 3 JQ/3.0SP SOPN [id number] No Inject 3 mg as directed once a week. Gwenlyn Perking, FNP Taking Active            Med Note Parthenia Ames Jul 22, 2021  8:57 AM) Via [employer] patient assistance program  estradiol (ESTRACE VAGINAL) 0.1 MG/GM vaginal cream [id number] No Place 1 Applicatorful vaginally 3 (three) times a week. Gwenlyn Perking, FNP Taking Active   gabapentin (NEURONTIN) 300 MG capsule [id number] No Take 1 capsule (300 mg total) by mouth 3 (three) times daily. Gwenlyn Perking, FNP Taking Active   Insulin Glargine [facility] KWIKPEN) 100 UNIT/ML [id number] No Inject 32 Units into the skin at bedtime.  Patient taking differently: Inject 10 Units into the skin at  bedtime.   Gwenlyn Perking, FNP Taking Active            Med Note Parthenia Ames Mar 25, 2021  9:23 AM) Gets via [employer] patient assistance   insulin lispro (HUMALOG KWIKPEN) 100 UNIT/ML KwikPen [id number] No Before each meal, 3x daily. For blood sugar of 140-199: 2 units, 200-250: 4 units, 251-299: 8 units, 300-349: 10 units, for 350 or above: 14 units.  Patient taking differently: 10 Units. Before each meal, 3x daily. For blood sugar of 140-199: 2 units, 200-250: 4 units, 251-299: 8 units, 300-349: 10 units, for 350 or above: 14 units.   Gwenlyn Perking, FNP Taking Active            Med Note Parthenia Ames Mar 25, 2021  9:23 AM) Marguerite Olea via [place] [address] cares patient assistance  losartan-hydrochlorothiazide [facility]) 100-25 MG tablet [id number] No TAKE 1 TABLET BY MOUTH DAILY Gwenlyn Perking, FNP Taking Active   metFORMIN (GLUCOPHAGE-XR) 500 MG 24 hr tablet [id number] No Take 2 tablets  (1,000 mg total) by mouth 2 (two) times daily with a meal. Gwenlyn Perking, FNP Taking Active   Multiple Vitamins-Minerals (MULTIVITAMIN PO) [id number] No Take 1 tablet by mouth daily.  [provider] Taking Active Pharmacy Records  rosuvastatin (CRESTOR) 10 MG tablet [id number] No Take 1 tablet (10 mg total) by mouth daily. Gwenlyn Perking, FNP Taking Active   traZODone (DESYREL) 50 MG tablet [id number] No Take 0.5-1 tablets (25-50 mg total) by mouth at bedtime as needed for sleep. 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 associated with diabetes mellitus due to underlying condition ([place]) 05/14/2021   Neuropathy 01/06/2021   Uterine leiomyoma 06/18/2020   DKA, type 2 ([place]) 02/16/2020   Hyperkalemia 02/16/2020   Hypercalcemia 79/10/4095   Acute metabolic encephalopathy 35/32/9924   AKI (acute kidney injury) ([place]) 02/16/2020   Shingles of eyelid 02/16/2020   Hypertensive urgency 02/16/2020   Hidradenitis axillaris 08/31/2016   Visit for screening mammogram 08/31/2016   Impingement syndrome of right shoulder 08/12/2016   Anxiety 11/19/2015   Hyperlipidemia 09/02/2014   Type 2 diabetes mellitus with hyperglycemia, with long-term current use of insulin ([place]) 11/22/2013   HTN (hypertension) 11/22/2013    Immunization History  Administered Date(s) Administered   Influenza,inj,Quad PF,6+ Mos 10/17/2017, 06/05/2019   Influenza-Unspecified 07/21/2014   Moderna Sars-Covid-2 Vaccination 12/09/2019, 12/29/2019, 08/29/2020   Pneumococcal Conjugate-13 03/16/2020   Pneumococcal  Polysaccharide-23 05/27/2021   Tdap 03/16/2020    Conditions to be addressed/monitored: HTN and DMII  Care Plan : PHARMD MEDICATION MANAGEMENT  Updates made by Lavera Guise, [place] since 10/13/2021 12:00 AM     Problem: DISEASE PROGRESSION PREVENTION      Long-Range Goal: T2DM PharmD   Recent Progress: On track  Priority: High  Note:   Current Barriers:  Unable to independently afford treatment regimen--MEDICARE COVERAGE GAP, HIGH COPAYS Unable to achieve control of T2DM  Suboptimal therapeutic regimen for T2DM   Pharmacist Clinical Goal(s):  Over the next 90 days, patient  will verbalize ability to afford treatment regimen achieve control of T2DM as evidenced by GOAL A1C<7%, CONTROLLED BLOOD SUGARS adhere to prescribed medication regimen as evidenced by IMPROVED GLYCEMIC CONTROL  through collaboration with PharmD and provider.    Interventions: 1:1 collaboration with Gwenlyn Perking, FNP regarding development and update of comprehensive plan of care as evidenced by provider attestation and co-signature Inter-disciplinary care team collaboration (see longitudinal plan of care) Comprehensive medication review performed; medication list updated in electronic medical record  Diabetes: Controlled (based on CGM readings--will repeat a1c at pcp f/u in 3 months); current treatment: BASAGLAR 10 units daily, HUMALOG 10 units w/ most meals; TRULICITY 3mg  (wednesdays), metformin BGs are trending down--USING LESS INSULIN Patient uses dexcom for CGM CONTINUE Trulicity 3mg  sq weekly RE-Enrolled in [place] cares patient assistance (until 09/25/22) meds ship to patient's house (trulicity, basaglar, humalog) Denies personal and family history of Medullary thyroid cancer (MTC) TOLERATING WELL (was having nausea but better) CAN INCREASE TO 4.5MG  SQ WEEKLY AS TOLERATED CONTINUE Basaglar TO 10 UNITS nightly CONTINUE Humalog TO 10 UNITS WITH MEALS  CONTINUE metformin WOULD LIKE TO TRANSITION  PATIENT TO MOUNJARO, BUT WAITING UNTIL L[employer] CARES ADDS TO PATIENT ASSISTANCE C-peptide- level 3.0 post prandial-indicative of T2DM (BUT NOT A DEFINITE) Endocrine recommends labs to see if T1 Vs T2-->ZnT8, GAD, insulin antibodies--NOT COVERED ON INSURANCE, WILL POSTPONE THESE LABS AND ASSESS RESPONSE FROM GLP1 THERAPY New dexcom was given to patient by insurance--patient's malfunctioned  Current glucose readings: fasting glucose: 120-145s, post prandial glucose: 160-170 reports hyperglycemic symptoms Discussed meal planning options and Plate method for healthy eating (30-45 cabs per meal, 2x15g snacks) Avoid sugary drinks and desserts Incorporate balanced protein, non starchy veggies, 1 serving of carbohydrate with each meal Increase water intake Increase physical activity as able Current exercise: n/a Educated on diet, medication adjustments Assessed patient finances. Enrolled patient in [employer] for basaglar and humalog insulin pens--patient approved for basaglar/humalog/trulicity until 78/58/85-OYDX ship to patient's home  Patient Goals/Self-Care Activities Over the next 90 days, patient will:  - take medications as prescribed check glucose CONTINUOUSLY USING CGM, document, and provide at future appointments engage in dietary modifications by [place] METHOD  Follow Up Plan: Telephone follow up appointment with care management team member scheduled for:  6 months      Medication Assistance:  trulicity, basaglar, humalog obtained through [employer] medication assistance program.  Enrollment ends 09/25/22  Patient's preferred pharmacy is:  [facility], [address] [place] [address] Phone: [phone] Fax: [phone]  [facility] [place], [place] - [address] [place] [address] Phone: [phone] Fax:  [phone]  [id number] [place], [address] [place] [address] Phone: [phone] Fax: [phone]   Follow Up:  Patient agrees to Care Plan and Follow-up.  Plan: Telephone follow up appointment with care management team member scheduled for:  6 months   Regina Eck, PharmD, BCPS Clinical Pharmacist, [place]  II Phone [phone]

## 2021-10-18 ENCOUNTER — Telehealth: Payer: Self-pay | Admitting: Family Medicine

## 2021-10-18 DIAGNOSIS — Z794 Long term (current) use of insulin: Secondary | ICD-10-CM

## 2021-10-18 NOTE — Telephone Encounter (Signed)
Pt was told at [facility] that her insurance will no longer cover there dexacom transmitter and senor refills. Please call back

## 2021-10-20 MED ORDER — DEXCOM G6 SENSOR MISC: 4 refills | Status: DC

## 2021-10-20 MED ORDER — DEXCOM G6 TRANSMITTER MISC: 3 refills | Status: DC

## 2021-10-20 NOTE — Telephone Encounter (Signed)
Where does patient need [facility]com called into if not [facility]? Please call patient and let me know I'm happy to call in wherever it is covered under her new insurance

## 2021-10-20 NOTE — Telephone Encounter (Signed)
[facility] wants [facility]

## 2021-10-20 NOTE — Telephone Encounter (Signed)
[facility]

## 2021-10-22 ENCOUNTER — Other Ambulatory Visit: Payer: Self-pay | Admitting: Family Medicine

## 2021-10-22 DIAGNOSIS — G4701 Insomnia due to medical condition: Secondary | ICD-10-CM

## 2021-10-26 DIAGNOSIS — Z113 Encounter for screening for infections with a predominantly sexual mode of transmission: Secondary | ICD-10-CM | POA: Diagnosis not present

## 2021-10-26 DIAGNOSIS — L900 Lichen sclerosus et atrophicus: Secondary | ICD-10-CM | POA: Diagnosis not present

## 2021-10-26 DIAGNOSIS — R69 Illness, unspecified: Secondary | ICD-10-CM | POA: Diagnosis not present

## 2021-10-26 DIAGNOSIS — N760 Acute vaginitis: Secondary | ICD-10-CM | POA: Diagnosis not present

## 2021-10-26 DIAGNOSIS — E1165 Type 2 diabetes mellitus with hyperglycemia: Secondary | ICD-10-CM

## 2021-12-06 ENCOUNTER — Other Ambulatory Visit: Payer: Self-pay | Admitting: *Deleted

## 2021-12-06 MED ORDER — METFORMIN HCL ER 500 MG PO TB24: 1000.0000 mg | ORAL_TABLET | Freq: Two times a day (BID) | ORAL | 0 refills | Status: DC

## 2021-12-20 ENCOUNTER — Other Ambulatory Visit: Payer: Self-pay | Admitting: *Deleted

## 2021-12-20 DIAGNOSIS — E0821 Diabetes mellitus due to underlying condition with diabetic nephropathy: Secondary | ICD-10-CM

## 2021-12-20 MED ORDER — GABAPENTIN 300 MG PO CAPS: 300.0000 mg | ORAL_CAPSULE | Freq: Three times a day (TID) | ORAL | 0 refills | Status: DC

## 2021-12-23 ENCOUNTER — Other Ambulatory Visit: Payer: Self-pay | Admitting: Family Medicine

## 2021-12-23 NOTE — Telephone Encounter (Signed)
Rebecca Cook. NTBS 30 days given 12/06/21 ?

## 2021-12-23 NOTE — Telephone Encounter (Signed)
Appt made for 4/5  ?

## 2021-12-29 ENCOUNTER — Ambulatory Visit (INDEPENDENT_AMBULATORY_CARE_PROVIDER_SITE_OTHER): Payer: Medicare HMO | Admitting: Family Medicine

## 2021-12-29 ENCOUNTER — Encounter: Payer: Self-pay | Admitting: Family Medicine

## 2021-12-29 VITALS — BP 122/70 | HR 83 | Temp 98.3°F | Ht 66.0 in | Wt 190.4 lb

## 2021-12-29 DIAGNOSIS — I152 Hypertension secondary to endocrine disorders: Secondary | ICD-10-CM | POA: Diagnosis not present

## 2021-12-29 DIAGNOSIS — E785 Hyperlipidemia, unspecified: Secondary | ICD-10-CM

## 2021-12-29 DIAGNOSIS — Z1211 Encounter for screening for malignant neoplasm of colon: Secondary | ICD-10-CM

## 2021-12-29 DIAGNOSIS — E1159 Type 2 diabetes mellitus with other circulatory complications: Secondary | ICD-10-CM

## 2021-12-29 DIAGNOSIS — E1165 Type 2 diabetes mellitus with hyperglycemia: Secondary | ICD-10-CM | POA: Diagnosis not present

## 2021-12-29 DIAGNOSIS — Z794 Long term (current) use of insulin: Secondary | ICD-10-CM

## 2021-12-29 DIAGNOSIS — R69 Illness, unspecified: Secondary | ICD-10-CM | POA: Diagnosis not present

## 2021-12-29 DIAGNOSIS — E1169 Type 2 diabetes mellitus with other specified complication: Secondary | ICD-10-CM | POA: Diagnosis not present

## 2021-12-29 DIAGNOSIS — I10 Essential (primary) hypertension: Secondary | ICD-10-CM | POA: Diagnosis not present

## 2021-12-29 DIAGNOSIS — F320 Major depressive disorder, single episode, mild: Secondary | ICD-10-CM | POA: Diagnosis not present

## 2021-12-29 DIAGNOSIS — E1140 Type 2 diabetes mellitus with diabetic neuropathy, unspecified: Secondary | ICD-10-CM | POA: Diagnosis not present

## 2021-12-29 DIAGNOSIS — F419 Anxiety disorder, unspecified: Secondary | ICD-10-CM

## 2021-12-29 LAB — BAYER DCA HB A1C WAIVED: HB A1C (BAYER DCA - WAIVED): 6.5 % — ABNORMAL HIGH (ref 4.8–5.6)

## 2021-12-29 MED ORDER — GABAPENTIN 600 MG PO TABS: 300.0000 mg | ORAL_TABLET | Freq: Two times a day (BID) | ORAL | 3 refills | Status: DC

## 2021-12-29 MED ORDER — ROSUVASTATIN CALCIUM 10 MG PO TABS: 10.0000 mg | ORAL_TABLET | Freq: Every day | ORAL | 3 refills | Status: DC

## 2021-12-29 NOTE — Progress Notes (Signed)
? ?Established Patient Office Visit ? ?Subjective:  ?Patient ID: Rebecca Cook, female    DOB: [birth year]  Age: 66 y.o. MRN: [id number] ? ?CC:  ?Chief Complaint  ?Patient presents with  ? Medical Management of Chronic Issues  ? Diabetes  ? Hyperlipidemia  ? Hypertension  ? ? ?HPI ?Rebecca Cook presents for chronic follow up.  ? ?DM ?Patient denies foot ulcerations, hypoglycemia , nausea, paresthesia of the feet, visual disturbances, vomiting, and weight loss. ? ?Current diabetic medications include trulicity 3 mg, metformin 1000 mg BID, humalog 10 units 3x a day, and basaglar 10 mg at night ?Compliant with meds - Yes ? ?Current monitoring regimen:  2x a day ?Home blood sugar records: fasting range: <200- this is typical for her, at dinnertime it is usually 110-120 ?Any episodes of hypoglycemia? rarely ? ?Weight trend: stable ?Current diet: diabetic ? ?2. HTN ?Complaint with meds - Yes ?Current Medications - losartan-hctz ?Pertinent ROS:  ?Visual Disturbances - No ?Chest pain - No ?Dyspnea - No ?Palpitations - No ?LE edema - No ? ?BP Readings from Last 3 Encounters:  ?12/29/21 122/70  ?10/13/21 130/78  ?09/08/21 132/70  ? ?3. Anxiety, depression ? ? ?  12/29/2021  ?  3:59 PM 09/08/2021  ? 11:50 AM 06/30/2021  ?  4:15 PM  ?Depression screen PHQ 2/9  ?Decreased Interest 0 0 1  ?Down, Depressed, Hopeless 0 0 1  ?PHQ - 2 Score 0 0 2  ?Altered sleeping '3 2 1  '$ ?Tired, decreased energy '3 3 3  '$ ?Change in appetite '1 2 1  '$ ?Feeling bad or failure about yourself  0 0 0  ?Trouble concentrating 0 1 2  ?Moving slowly or fidgety/restless 0 0 0  ?Suicidal thoughts 0 0 0  ?PHQ-9 Score '7 8 9  '$ ?Difficult doing work/chores Not difficult at all Somewhat difficult Somewhat difficult  ? ? ?  12/29/2021  ?  4:00 PM 09/08/2021  ? 11:51 AM 06/30/2021  ?  4:15 PM 05/27/2021  ? 10:46 AM  ?GAD 7 : Generalized Anxiety Score  ?Nervous, Anxious, on Edge 0 '1 2 2  '$ ?Control/stop worrying 0 '1 2 2  '$ ?Worry too much - different things 0 '1 2 2  '$ ?Trouble relaxing  0 '1 2 2  '$ ?Restless 0 1 0 1  ?Easily annoyed or irritable 0 '1 1 1  '$ ?Afraid - awful might happen 0 0 0 0  ?Total GAD 7 Score 0 '6 9 10  '$ ?Anxiety Difficulty Not difficult at all Somewhat difficult Somewhat difficult Somewhat difficult  ? ? ? ?Past Medical History:  ?Diagnosis Date  ? Diabetes mellitus, type II ([place])   ? Frequent headaches   ? GERD (gastroesophageal reflux disease)   ? Hypertension   ? Kidney stones   ? None current, last had kidney stones in 1986  ? ? ?Past Surgical History:  ?Procedure Laterality Date  ? TONSILLECTOMY AND ADENOIDECTOMY  1985  ? TUBAL LIGATION  1985  ? ? ?Family History  ?Problem Relation Age of Onset  ? Cancer Mother   ?     Lung  ? Heart disease Father   ? Hypertension Father   ? Heart disease Paternal Grandmother   ?     Thyroid  ? Hypertension Paternal Grandmother   ? Cancer - Other Brother   ? Diabetes Maternal Grandfather   ? ? ?Social History  ? ?Socioeconomic History  ? Marital status: Divorced  ?  Spouse name: Not on file  ? Number of  children: 2  ? Years of education: College  ? Highest education level: Associate degree: academic program  ?Occupational History  ?  Employer: [employer]  ?Tobacco Use  ? Smoking status: Former  ? Smokeless tobacco: Never  ?Substance and Sexual Activity  ? Alcohol use: Yes  ?  Alcohol/week: 0.0 standard drinks  ?  Comment: occ  ? Drug use: No  ? Sexual activity: Yes  ?  Birth control/protection: None  ?Other Topics Concern  ? Not on file  ?Social History Narrative  ? Not on file  ? ?Social Determinants of Health  ? ?Financial Resource Strain: Low Risk   ? Difficulty of Paying Living Expenses: Not very hard  ?Food Insecurity: No Food Insecurity  ? Worried About Charity fundraiser in the Last Year: Never true  ? Ran Out of Food in the Last Year: Never true  ?Transportation Needs: No Transportation Needs  ? Lack of Transportation (Medical): No  ? Lack of Transportation (Non-Medical): No  ?Physical Activity: Sufficiently Active  ? Days of  Exercise per Week: 5 days  ? Minutes of Exercise per Session: 40 min  ?Stress: Stress Concern Present  ? Feeling of Stress : To some extent  ?Social Connections: Moderately Isolated  ? Frequency of Communication with Friends and Family: More than three times a week  ? Frequency of Social Gatherings with Friends and Family: Once a week  ? Attends Religious Services: More than 4 times per year  ? Active Member of Clubs or Organizations: No  ? Attends Archivist Meetings: Never  ? Marital Status: Divorced  ?Intimate Partner Violence: Not At Risk  ? Fear of Current or Ex-Partner: No  ? Emotionally Abused: No  ? Physically Abused: No  ? Sexually Abused: No  ? ? ?Outpatient Medications Prior to Visit  ?Medication Sig Dispense Refill  ? cetirizine (ZYRTEC) 10 MG tablet Take 1 tablet (10 mg total) by mouth daily. 30 tablet 11  ? Dulaglutide (TRULICITY) 3 GQ/9.1QX SOPN Inject 3 mg as directed once a week. 6 mL 3  ? estradiol (ESTRACE VAGINAL) 0.1 MG/GM vaginal cream Place 1 Applicatorful vaginally 3 (three) times a week. 42.5 g 12  ? gabapentin (NEURONTIN) 300 MG capsule Take 1 capsule (300 mg total) by mouth 3 (three) times daily. (NEEDS TO BE SEEN BEFORE NEXT REFILL) 90 capsule 0  ? Insulin Glargine (BASAGLAR KWIKPEN) 100 UNIT/ML Inject 32 Units into the skin at bedtime. (Patient taking differently: Inject 10 Units into the skin at bedtime.) 45 mL 3  ? insulin lispro (HUMALOG KWIKPEN) 100 UNIT/ML KwikPen Before each meal, 3x daily. For blood sugar of 140-199: 2 units, 200-250: 4 units, 251-299: 8 units, 300-349: 10 units, for 350 or above: 14 units. (Patient taking differently: 10 Units. Before each meal, 3x daily. For blood sugar of 140-199: 2 units, 200-250: 4 units, 251-299: 8 units, 300-349: 10 units, for 350 or above: 14 units.) 15 mL 1  ? losartan-hydrochlorothiazide (HYZAAR) 100-25 MG tablet TAKE 1 TABLET BY MOUTH DAILY 90 tablet 1  ? metFORMIN (GLUCOPHAGE-XR) 500 MG 24 hr tablet Take 2 tablets (1,000 mg  total) by mouth 2 (two) times daily with a meal. (NEEDS TO BE SEEN BEFORE NEXT REFILL) 120 tablet 0  ? Multiple Vitamins-Minerals (MULTIVITAMIN PO) Take 1 tablet by mouth daily.     ? rosuvastatin (CRESTOR) 10 MG tablet Take 1 tablet (10 mg total) by mouth daily. 90 tablet 3  ? UNABLE TO FIND Collagen Peptide -  1 scoop qd    ? Continuous Blood Gluc Sensor (DEXCOM G6 SENSOR) MISC Use to test blood sugar continuously as directed. DX: e11.65 9 each 4  ? Continuous Blood Gluc Transmit (DEXCOM G6 TRANSMITTER) MISC Use to test blood sugar continuously as directed. DX: e11.65 1 each 3  ? traZODone (DESYREL) 50 MG tablet TAKE 1/2 TO 1 TABLET(25 TO 50 MG) BY MOUTH AT BEDTIME AS NEEDED FOR SLEEP 60 tablet 0  ? ?No facility-administered medications prior to visit.  ? ? ?No Known Allergies ? ?ROS ?Review of Systems ?As per HPI.  ?  ?Objective:  ?  ?Physical Exam ?Vitals and nursing note reviewed.  ?Constitutional:   ?   General: She is not in acute distress. ?   Appearance: She is not ill-appearing, toxic-appearing or diaphoretic.  ?HENT:  ?   Nose: Nose normal.  ?   Mouth/Throat:  ?   Mouth: Mucous membranes are moist.  ?   Pharynx: Oropharynx is clear.  ?Cardiovascular:  ?   Rate and Rhythm: Normal rate and regular rhythm.  ?   Heart sounds: Normal heart sounds. No murmur heard. ?Pulmonary:  ?   Effort: Pulmonary effort is normal. No respiratory distress.  ?   Breath sounds: Normal breath sounds.  ?Abdominal:  ?   General: Bowel sounds are normal. There is no distension.  ?   Palpations: Abdomen is soft.  ?   Tenderness: There is no abdominal tenderness. There is no guarding or rebound.  ?Musculoskeletal:  ?   Right lower leg: No edema.  ?   Left lower leg: No edema.  ?Skin: ?   General: Skin is warm and dry.  ?Neurological:  ?   General: No focal deficit present.  ?   Mental Status: She is alert and oriented to person, place, and time.  ?Psychiatric:     ?   Mood and Affect: Mood normal.     ?   Behavior: Behavior normal.   ? ? ?BP 122/70   Pulse 83   Temp 98.3 ?F (36.8 ?C) (Temporal)   Ht '5\' 6"'$  (1.676 m)   Wt 190 lb 6 oz (86.4 kg)   BMI 30.73 kg/m?  ?Wt Readings from Last 3 Encounters:  ?12/29/21 190 lb 6 oz (86.4 kg)  ?12/14

## 2021-12-29 NOTE — Patient Instructions (Signed)

## 2021-12-30 LAB — CMP14+EGFR
ALT: 8 IU/L (ref 0–32)
AST: 12 IU/L (ref 0–40)
Albumin/Globulin Ratio: 1.6 (ref 1.2–2.2)
Albumin: 4 g/dL (ref 3.8–4.8)
Alkaline Phosphatase: 87 IU/L (ref 44–121)
BUN/Creatinine Ratio: 18 (ref 12–28)
BUN: 15 mg/dL (ref 8–27)
Bilirubin Total: 0.3 mg/dL (ref 0.0–1.2)
CO2: 23 mmol/L (ref 20–29)
Calcium: 9.5 mg/dL (ref 8.7–10.3)
Chloride: 103 mmol/L (ref 96–106)
Creatinine, Ser: 0.83 mg/dL (ref 0.57–1.00)
Globulin, Total: 2.5 g/dL (ref 1.5–4.5)
Glucose: 149 mg/dL — ABNORMAL HIGH (ref 70–99)
Potassium: 3.8 mmol/L (ref 3.5–5.2)
Sodium: 141 mmol/L (ref 134–144)
Total Protein: 6.5 g/dL (ref 6.0–8.5)
eGFR: 77 mL/min/{1.73_m2} (ref >59)

## 2021-12-30 LAB — CBC WITH DIFFERENTIAL/PLATELET
Basophils Absolute: 0.1 10*3/uL (ref 0.0–0.2)
Basos: 1 %
EOS (ABSOLUTE): 0.2 10*3/uL (ref 0.0–0.4)
Eos: 2 %
Hematocrit: 40.5 % (ref 34.0–46.6)
Hemoglobin: 13.4 g/dL (ref 11.1–15.9)
Immature Grans (Abs): 0 10*3/uL (ref 0.0–0.1)
Immature Granulocytes: 0 %
Lymphocytes Absolute: 2.4 10*3/uL (ref 0.7–3.1)
Lymphs: 24 %
MCH: 26.8 pg (ref 26.6–33.0)
MCHC: 33.1 g/dL (ref 31.5–35.7)
MCV: 81 fL (ref 79–97)
Monocytes Absolute: 0.7 10*3/uL (ref 0.1–0.9)
Monocytes: 7 %
Neutrophils Absolute: 6.6 10*3/uL (ref 1.4–7.0)
Neutrophils: 66 %
Platelets: 335 10*3/uL (ref 150–450)
RBC: 5 x10E6/uL (ref 3.77–5.28)
RDW: 13.6 % (ref 11.7–15.4)
WBC: 9.9 10*3/uL (ref 3.4–10.8)

## 2021-12-30 LAB — LIPID PANEL
Chol/HDL Ratio: 2.9 ratio (ref 0.0–4.4)
Cholesterol, Total: 109 mg/dL (ref 100–199)
HDL: 37 mg/dL — ABNORMAL LOW (ref >39)
LDL Chol Calc (NIH): 39 mg/dL (ref 0–99)
Triglycerides: 210 mg/dL — ABNORMAL HIGH (ref 0–149)
VLDL Cholesterol Cal: 33 mg/dL (ref 5–40)

## 2022-01-05 ENCOUNTER — Other Ambulatory Visit: Payer: Self-pay | Admitting: *Deleted

## 2022-01-05 MED ORDER — METFORMIN HCL ER 500 MG PO TB24: 1000.0000 mg | ORAL_TABLET | Freq: Two times a day (BID) | ORAL | 0 refills | Status: DC

## 2022-01-09 ENCOUNTER — Other Ambulatory Visit: Payer: Self-pay | Admitting: Family Medicine

## 2022-01-09 DIAGNOSIS — E0821 Diabetes mellitus due to underlying condition with diabetic nephropathy: Secondary | ICD-10-CM

## 2022-01-16 DIAGNOSIS — Z1211 Encounter for screening for malignant neoplasm of colon: Secondary | ICD-10-CM | POA: Diagnosis not present

## 2022-01-26 LAB — COLOGUARD: COLOGUARD: NEGATIVE

## 2022-02-08 DIAGNOSIS — Z6831 Body mass index (BMI) 31.0-31.9, adult: Secondary | ICD-10-CM | POA: Diagnosis not present

## 2022-02-08 DIAGNOSIS — R300 Dysuria: Secondary | ICD-10-CM | POA: Diagnosis not present

## 2022-02-08 DIAGNOSIS — Z01419 Encounter for gynecological examination (general) (routine) without abnormal findings: Secondary | ICD-10-CM | POA: Diagnosis not present

## 2022-02-08 DIAGNOSIS — L900 Lichen sclerosus et atrophicus: Secondary | ICD-10-CM | POA: Diagnosis not present

## 2022-02-08 DIAGNOSIS — N9089 Other specified noninflammatory disorders of vulva and perineum: Secondary | ICD-10-CM | POA: Diagnosis not present

## 2022-02-22 IMAGING — DX DG CHEST 1V PORT
1 series · 1 of 1 positions shown · non-contrast
Comparison: None.

CLINICAL DATA: Cough and shortness of breath.

EXAM:
PORTABLE CHEST 1 VIEW

[chest ap]
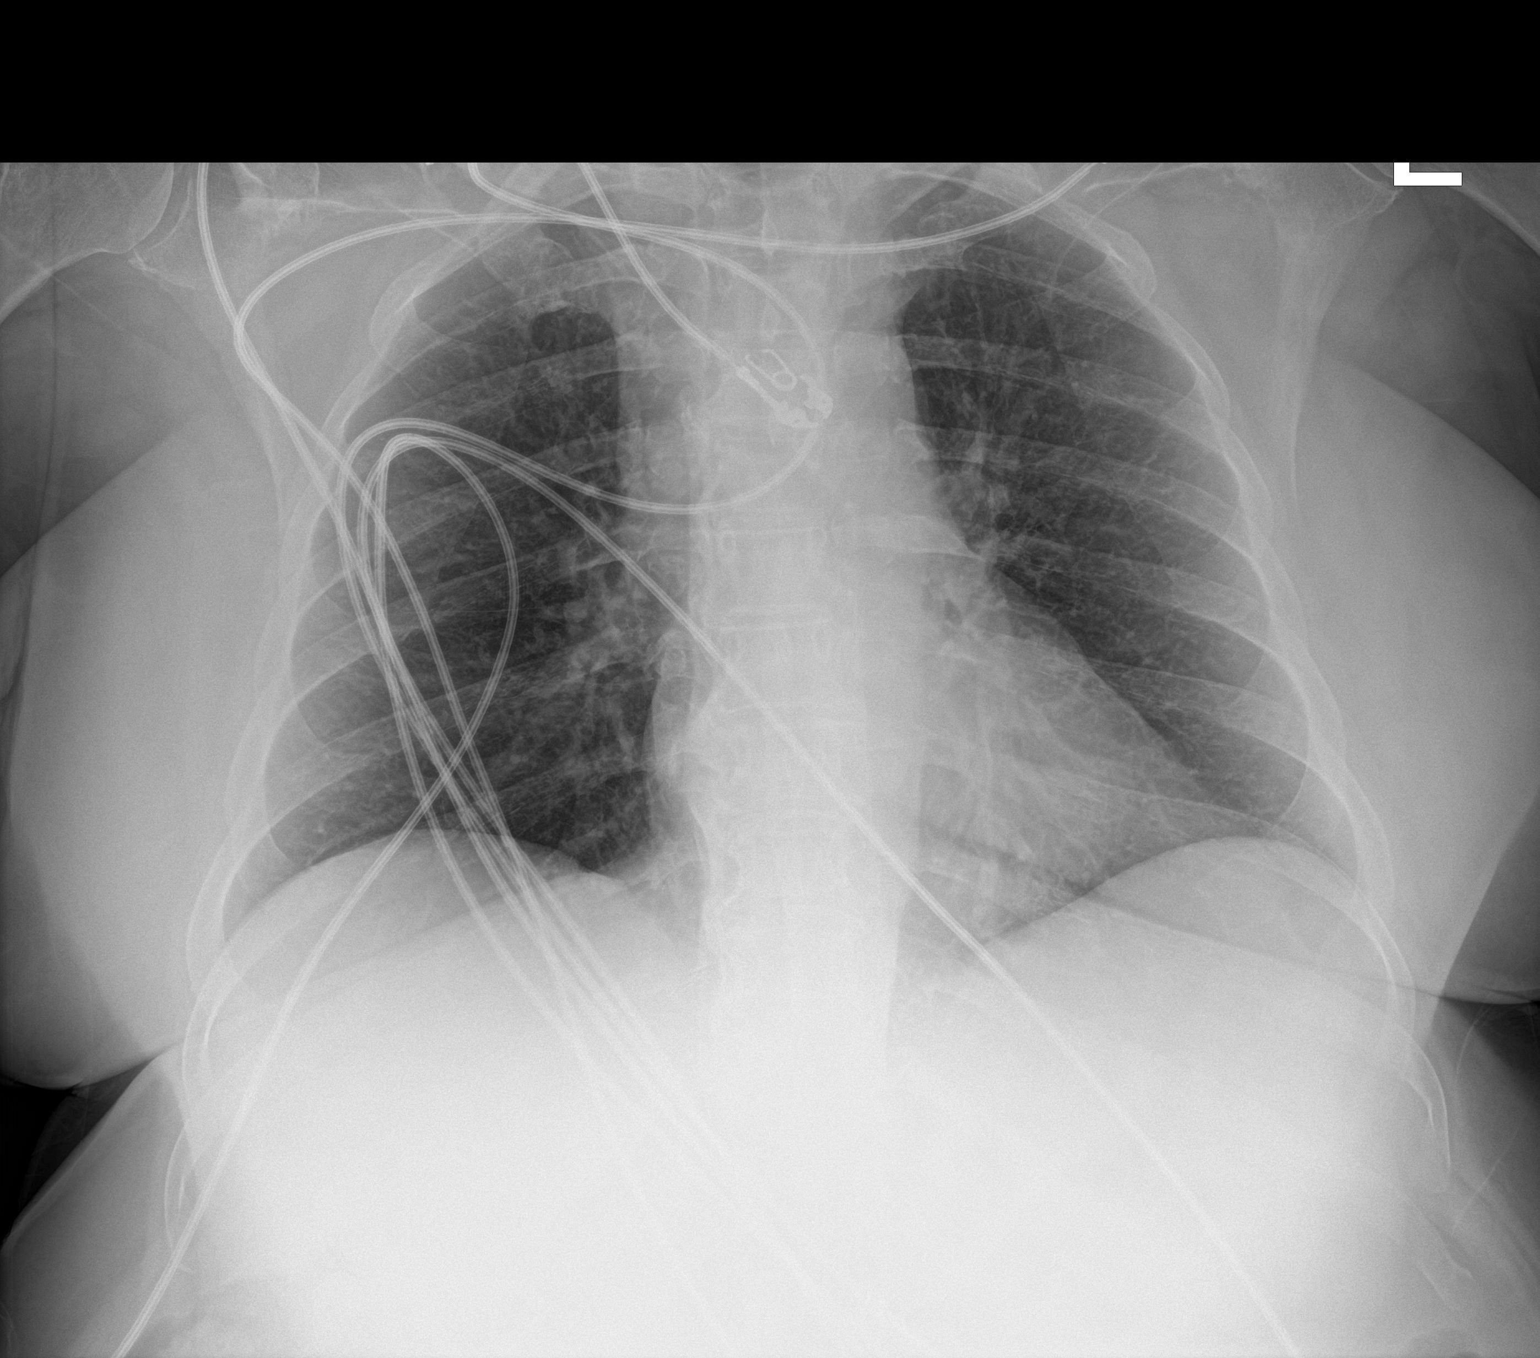

[1 of 1 positions shown; findings below may reference images not displayed]

FINDINGS: The heart size and mediastinal contours are within normal limits.
Both lungs are clear. The visualized skeletal structures are
unremarkable.
IMPRESSION: No active disease.

## 2022-03-04 DIAGNOSIS — N9089 Other specified noninflammatory disorders of vulva and perineum: Secondary | ICD-10-CM | POA: Diagnosis not present

## 2022-03-08 ENCOUNTER — Ambulatory Visit (INDEPENDENT_AMBULATORY_CARE_PROVIDER_SITE_OTHER): Payer: Medicare HMO | Admitting: Pharmacist

## 2022-03-08 DIAGNOSIS — Z794 Long term (current) use of insulin: Secondary | ICD-10-CM

## 2022-03-08 DIAGNOSIS — E0821 Diabetes mellitus due to underlying condition with diabetic nephropathy: Secondary | ICD-10-CM

## 2022-03-08 MED ORDER — TRULICITY 3 MG/0.5ML ~~LOC~~ SOAJ: 3.0000 mg | SUBCUTANEOUS | 3 refills | Status: DC

## 2022-03-08 MED ORDER — INSULIN LISPRO (1 UNIT DIAL) 100 UNIT/ML (KWIKPEN): 6.0000 [IU] | PEN_INJECTOR | Freq: Three times a day (TID) | SUBCUTANEOUS | 5 refills | Status: DC

## 2022-03-08 NOTE — Progress Notes (Signed)
Chronic Care Management Pharmacy Note  03/08/2022 Name:  Rebecca Cook MRN:  [id number] DOB:  October 24, 1955  Summary: Diabetes: Controlled--having higher FBG; current treatment: BASAGLAR 10 units daily, HUMALOG 5-6 units w/ meals; TRULICITY 3mg  (wednesdays), metformin BGs are trending down--USING LESS INSULIN Patient uses dexcom for CGM CONTINUE Trulicity 3mg  sq weekly RE-Enrolled in [place] cares patient assistance (until 09/25/22) meds ship to patient's house (trulicity, basaglar, humalog) Denies personal and family history of Medullary thyroid cancer (MTC) TOLERATING WELL (was having nausea but better) CAN INCREASE TO 4.5MG  SQ WEEKLY AS TOLERATED Try increasing Basaglar TO 12 UNITS nightly--to cover higher FBG CONTINUE Humalog 5-6 UNITS WITH MEALS  CONTINUE metformin WOULD LIKE TO TRANSITION PATIENT TO MOUNJARO, BUT WAITING UNTIL [employer] ADDS TO PATIENT ASSISTANCE C-peptide- level 3.0 post prandial-indicative of T2DM (BUT NOT A DEFINITE) Endocrine recommends labs to see if T1 Vs T2-->ZnT8, GAD, insulin antibodies--NOT COVERED ON INSURANCE, WILL POSTPONE THESE LABS AND ASSESS RESPONSE FROM GLP1 THERAPY New dexcom was given to patient by insurance--patient's malfunctioned  Current glucose readings: fasting glucose: 120-145s--some in the 200s, post prandial glucose: 160-170  Subjective: Rebecca Cook is an 66 y.o. year old female who is a primary patient of Gwenlyn Perking, FNP.  The CCM team was consulted for assistance with disease management and care coordination needs.    Engaged with patient by telephone for follow up visit in response to provider referral for pharmacy case management and/or care coordination services.   Consent to Services:  The patient was given information about Chronic Care Management services, agreed to services, and gave verbal consent prior to initiation of services.  Please see initial visit note for detailed documentation.   Patient Care  Team: Gwenlyn Perking, FNP as PCP - General (Family Medicine) Gala Romney Cristopher Estimable, MD as Consulting Physician (Gastroenterology) Lavera Guise, [facility] as Pharmacist (Family Medicine)  Objective:  Lab Results  Component Value Date   CREATININE 0.83 12/29/2021   CREATININE 0.97 06/30/2021   CREATININE 0.69 04/08/2021    Lab Results  Component Value Date   HGBA1C 6.5 (H) 12/29/2021   Last diabetic Eye exam: No results found for: "HMDIABEYEEXA"  Last diabetic Foot exam: No results found for: "HMDIABFOOTEX"      Component Value Date/Time   CHOL 109 12/29/2021 1601   TRIG 210 (H) 12/29/2021 1601   HDL 37 (L) 12/29/2021 1601   CHOLHDL 2.9 12/29/2021 1601   CHOLHDL 6 10/17/2017 1534   VLDL 67.0 (H) 10/17/2017 1534   LDLCALC 39 12/29/2021 1601   LDLDIRECT 195.0 10/17/2017 1534       Latest Ref Rng & Units 12/29/2021    4:01 PM 06/30/2021    4:03 PM 04/08/2021   11:29 AM  Hepatic Function  Total Protein 6.0 - 8.5 g/dL 6.5  7.0  7.0   Albumin 3.8 - 4.8 g/dL 4.0  4.3  4.3   AST 0 - 40 IU/L 12  24  11    ALT 0 - 32 IU/L 8  18  10    Alk Phosphatase 44 - 121 IU/L 87  73  80   Total Bilirubin 0.0 - 1.2 mg/dL 0.3  0.5  0.4     Lab Results  Component Value Date/Time   TSH 2.11 10/17/2017 03:34 PM   TSH 1.94 09/11/2014 07:44 AM       Latest Ref Rng & Units 12/29/2021    4:01 PM 06/30/2021    4:03 PM 04/08/2021   11:29 AM  CBC  WBC 3.4 - 10.8 x10E3/uL 9.9  8.6  9.0   Hemoglobin 11.1 - 15.9 g/dL 13.4  14.4  14.3   Hematocrit 34.0 - 46.6 % 40.5  43.2  43.5   Platelets 150 - 450 x10E3/uL 335  260  257     No results found for: "VD25OH"  Clinical ASCVD: No  The ASCVD Risk score (Arnett DK, et al., 2019) failed to calculate for the following reasons:   The valid total cholesterol range is 130 to 320 mg/dL    Other: (CHADS2VASc if Afib, PHQ9 if depression, MMRC or CAT for COPD, ACT, DEXA)  Social History   Tobacco Use  Smoking Status Former  Smokeless Tobacco Never   BP  Readings from Last 3 Encounters:  12/29/21 122/70  10/13/21 130/78  09/08/21 132/70   Pulse Readings from Last 3 Encounters:  12/29/21 83  09/08/21 (!) 105  06/30/21 99   Wt Readings from Last 3 Encounters:  12/29/21 190 lb 6 oz (86.4 kg)  09/08/21 203 lb (92.1 kg)  06/30/21 204 lb (92.5 kg)    Assessment: Review of patient past medical history, allergies, medications, health status, including review of consultants reports, laboratory and other test data, was performed as part of comprehensive evaluation and provision of chronic care management services.   SDOH:  (Social Determinants of Health) assessments and interventions performed:    CCM Care Plan  No Known Allergies  Medications Reviewed Today     Reviewed by Lavera Guise, [facility] (Pharmacist) on 03/08/22 at 1135  Med List Status: <None>   Medication Order Taking? Sig Documenting Provider Last Dose Status Informant  cetirizine (ZYRTEC) 10 MG tablet [id number] No Take 1 tablet (10 mg total) by mouth daily. Gwenlyn Perking, FNP Taking Active   Dulaglutide (TRULICITY) 3 WU/9.8JX SOPN [id number] No Inject 3 mg as directed once a week. Gwenlyn Perking, FNP Taking Active            Med Note Parthenia Ames Jul 22, 2021  8:57 AM) Via [employer] patient assistance program  estradiol (ESTRACE VAGINAL) 0.1 MG/GM vaginal cream [id number] No Place 1 Applicatorful vaginally 3 (three) times a week. Gwenlyn Perking, FNP Taking Active   gabapentin (NEURONTIN) 600 MG tablet [id number]  Take 0.5 tablets (300 mg total) by mouth 2 (two) times daily. Gwenlyn Perking, FNP  Active   Insulin Glargine [facility] KWIKPEN) 100 UNIT/ML [id number] No Inject 32 Units into the skin at bedtime.  Patient taking differently: Inject 10 Units into the skin at bedtime.   Gwenlyn Perking, FNP Taking Active            Med Note Parthenia Ames Mar 25, 2021  9:23 AM) Gets via [employer] patient assistance   insulin lispro (HUMALOG KWIKPEN)  100 UNIT/ML KwikPen [id number] No Before each meal, 3x daily. For blood sugar of 140-199: 2 units, 200-250: 4 units, 251-299: 8 units, 300-349: 10 units, for 350 or above: 14 units.  Patient taking differently: 10 Units. Before each meal, 3x daily. For blood sugar of 140-199: 2 units, 200-250: 4 units, 251-299: 8 units, 300-349: 10 units, for 350 or above: 14 units.   Gwenlyn Perking, FNP Taking Active            Med Note Parthenia Ames Mar 25, 2021  9:23 AM) Marguerite Olea via [employer] patient assistance  losartan-hydrochlorothiazide [facility]) 100-25 MG tablet [id number]  TAKE  1 TABLET BY MOUTH DAILY Gwenlyn Perking, FNP  Active   metFORMIN (GLUCOPHAGE-XR) 500 MG 24 hr tablet [id number]  Take 2 tablets (1,000 mg total) by mouth 2 (two) times daily with a meal. Gwenlyn Perking, FNP  Active   Multiple Vitamins-Minerals (MULTIVITAMIN PO) [id number] No Take 1 tablet by mouth daily.  [provider] Taking Active Pharmacy Records  rosuvastatin (CRESTOR) 10 MG tablet [id number]  Take 1 tablet (10 mg total) by mouth daily. 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 associated with diabetes mellitus due to underlying condition ([place]) 05/14/2021   Neuropathy 01/06/2021   Uterine leiomyoma 06/18/2020   DKA, type 2 ([place]) 02/16/2020   Hyperkalemia 02/16/2020   Hypercalcemia 38/06/1750   Acute metabolic encephalopathy 02/58/5277   AKI (acute kidney injury) ([place]) 02/16/2020   Shingles of eyelid 02/16/2020   Hypertensive urgency 02/16/2020   Hidradenitis axillaris 08/31/2016   Visit for screening mammogram  08/31/2016   Impingement syndrome of right shoulder 08/12/2016   Anxiety 11/19/2015   Hyperlipidemia associated with type 2 diabetes mellitus ([place]) 09/02/2014   Type 2 diabetes mellitus with hyperglycemia, with long-term current use of insulin ([place]) 11/22/2013   HTN (hypertension) 11/22/2013    Immunization History  Administered Date(s) Administered   Influenza,inj,Quad PF,6+ Mos 10/17/2017, 06/05/2019   Influenza-Unspecified 07/21/2014   Moderna Sars-Covid-2 Vaccination 12/09/2019, 12/29/2019, 08/29/2020   Pneumococcal Conjugate-13 03/16/2020   Pneumococcal Polysaccharide-23 05/27/2021   Tdap 03/16/2020    Conditions to be addressed/monitored: DMII  Care Plan : PHARMD MEDICATION MANAGEMENT  Updates made by Lavera Guise, [place] since 03/10/2022 12:00 AM     Problem: DISEASE PROGRESSION PREVENTION      Long-Range Goal: T2DM PharmD   Recent Progress: On track  Priority: High  Note:   Current Barriers:  Unable to independently afford treatment regimen--MEDICARE COVERAGE GAP, HIGH COPAYS Unable to achieve control of T2DM  Suboptimal therapeutic regimen for T2DM   Pharmacist Clinical Goal(s):  Over the next 90 days, patient will verbalize ability to afford treatment regimen achieve control of T2DM as evidenced by GOAL A1C<7%, CONTROLLED BLOOD SUGARS adhere to prescribed medication regimen as evidenced by IMPROVED GLYCEMIC CONTROL  through collaboration with PharmD and provider.    Interventions: 1:1 collaboration with Gwenlyn Perking, FNP regarding development and update of comprehensive plan of care as evidenced by provider attestation and co-signature Inter-disciplinary care team collaboration (see longitudinal plan of care) Comprehensive medication review performed; medication list updated in electronic medical record  Diabetes: Controlled--having higher FBG; current treatment: BASAGLAR 10 units daily, HUMALOG 5-6 units w/ meals; TRULICITY 3mg  (wednesdays),  metformin BGs are trending down--USING LESS INSULIN Patient uses dexcom for CGM CONTINUE Trulicity 3mg  sq weekly RE-Enrolled in [place] cares patient assistance (until 09/25/22) meds ship to patient's house (trulicity, basaglar, humalog) Denies personal and family history of Medullary thyroid cancer (MTC) TOLERATING WELL (was having nausea but better) CAN INCREASE TO 4.5MG  SQ WEEKLY AS TOLERATED Try increasing Basaglar TO 12 UNITS nightly--to cover higher FBG CONTINUE Humalog 5-6 UNITS WITH MEALS  CONTINUE metformin WOULD LIKE TO  TRANSITION PATIENT TO MOUNJARO, BUT WAITING UNTIL [employer] ADDS TO PATIENT ASSISTANCE C-peptide- level 3.0 post prandial-indicative of T2DM (BUT NOT A DEFINITE) Endocrine recommends labs to see if T1 Vs T2-->ZnT8, GAD, insulin antibodies--NOT COVERED ON INSURANCE, WILL POSTPONE THESE LABS AND ASSESS RESPONSE FROM GLP1 THERAPY New dexcom was given to patient by insurance--patient's malfunctioned  Current glucose readings: fasting glucose: 120-145s--some in the 200s, post prandial glucose: 160-170 reports hyperglycemic symptoms Discussed meal planning options and Plate method for healthy eating (30-45 cabs per meal, 2x15g snacks) Avoid sugary drinks and desserts Incorporate balanced protein, non starchy veggies, 1 serving of carbohydrate with each meal Increase water intake Increase physical activity as able Current exercise: n/a Educated on diet, medication adjustments Assessed patient finances. Enrolled patient in [employer] for basaglar and humalog insulin pens--patient approved for basaglar/humalog/trulicity until 12/52/71-[facility] ship to patient's home  Patient Goals/Self-Care Activities Over the next 90 days, patient will:  - take medications as prescribed check glucose CONTINUOUSLY USING CGM, document, and provide at future appointments engage in dietary modifications by CONTINUING TO FOLLOW HEALTHY PLATE METHOD  Follow Up Plan: Telephone  follow up appointment with care management team member scheduled for:  6 months      Follow Up:  Patient agrees to Care Plan and Follow-up.  Plan: Telephone follow up appointment with care management team member scheduled for:  07/2022   Regina Eck, PharmD, BCPS Clinical Pharmacist, [place]  II Phone [phone]

## 2022-03-09 ENCOUNTER — Telehealth: Payer: Self-pay | Admitting: Pharmacist

## 2022-03-09 IMAGING — DX DG CHEST 2V
2 series · 2 of 2 positions shown · non-contrast
Comparison: 02/16/2020

CLINICAL DATA: Atypical chest pain.  Diabetes and hypertension.

EXAM:
CHEST - 2 VIEW

[chest pa]
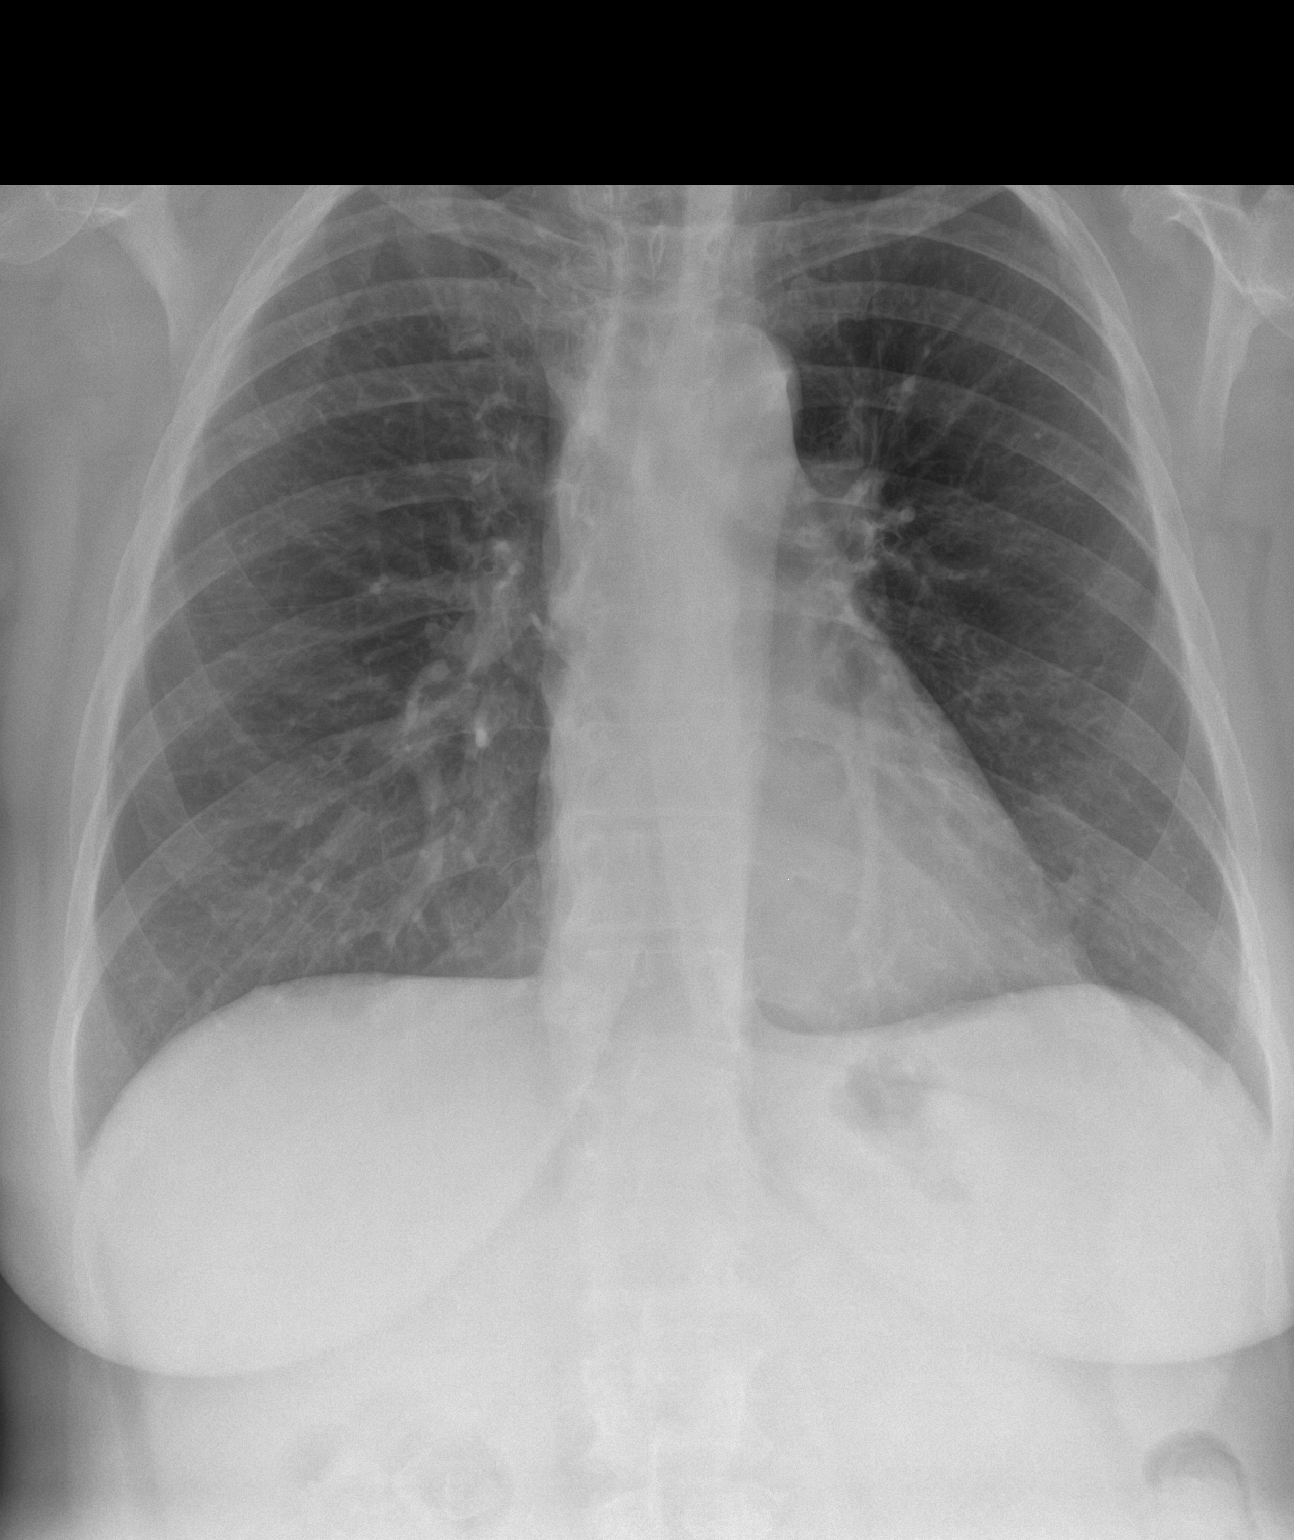

[chest lat]
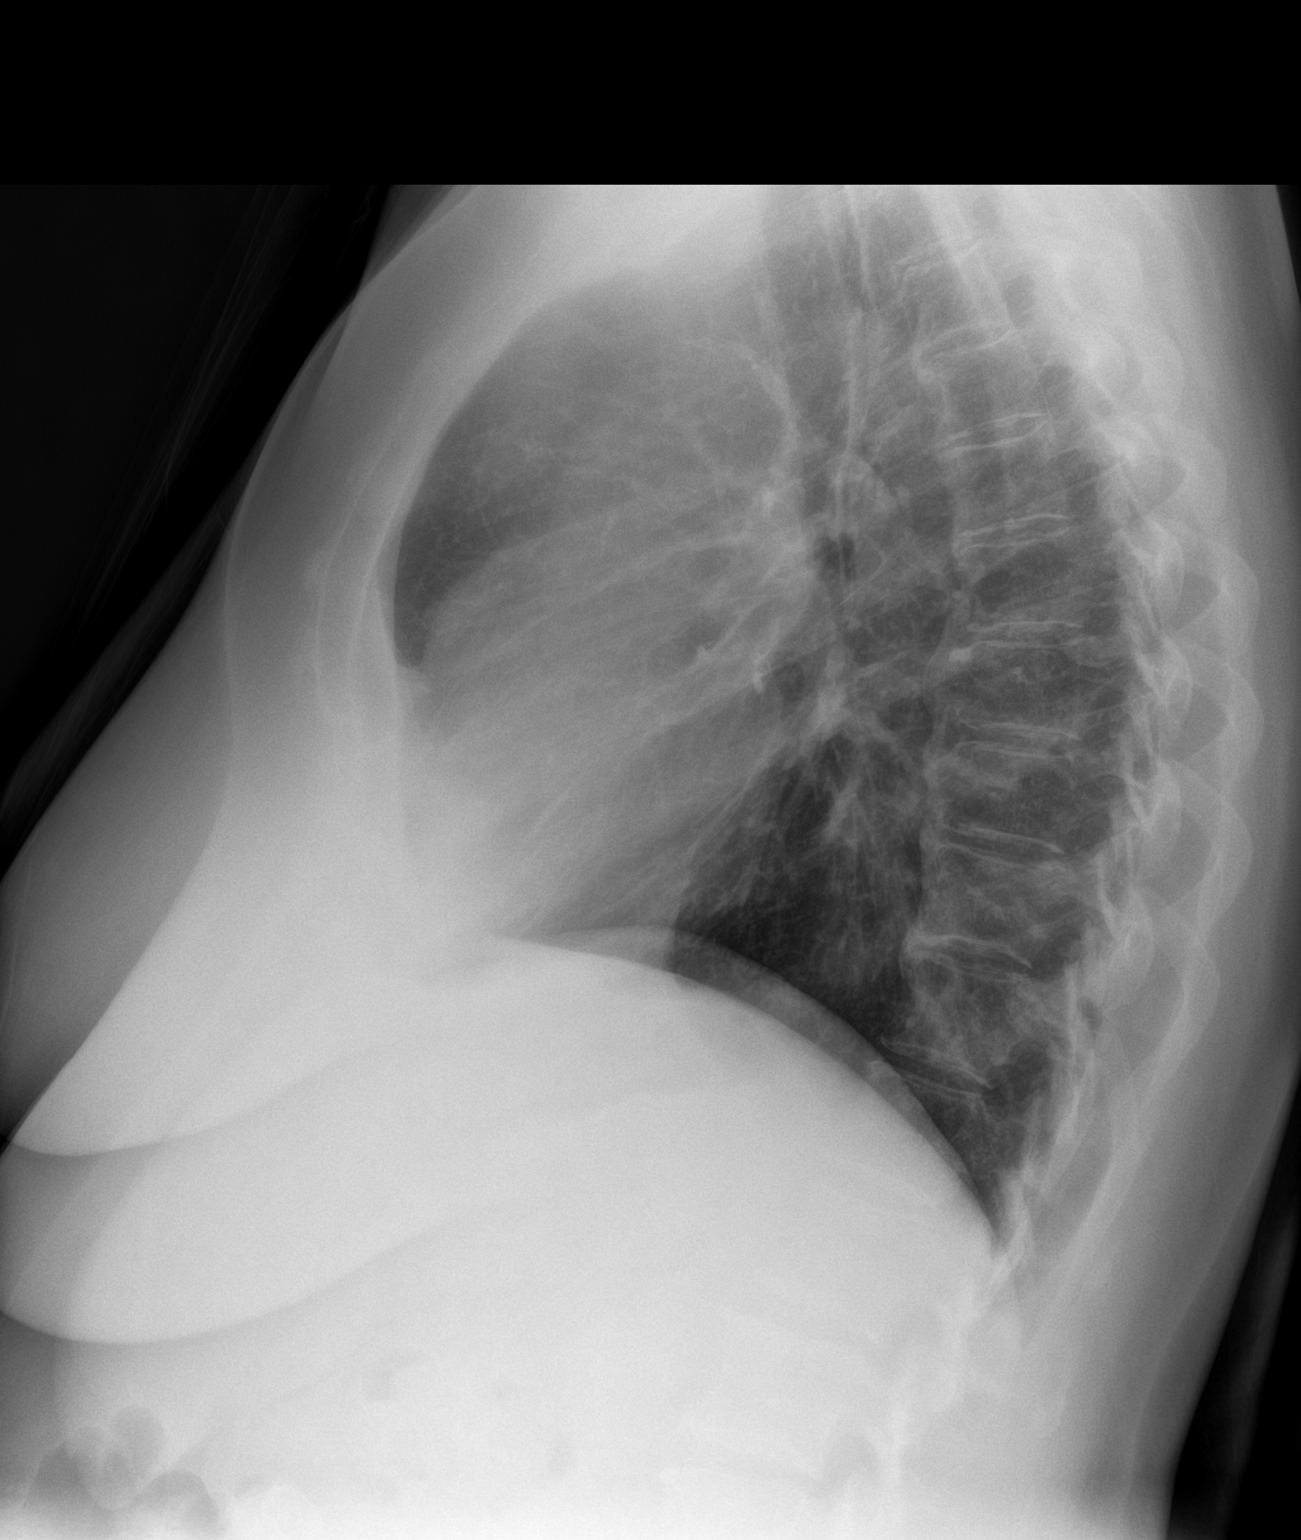

[2 of 2 positions shown; findings below may reference images not displayed]

FINDINGS: The heart size and mediastinal contours are within normal limits.
Both lungs are clear. The visualized skeletal structures are
unremarkable.
IMPRESSION: No active cardiopulmonary disease.

## 2022-03-09 NOTE — Telephone Encounter (Signed)
Patient is 2 yrs out from shingles event (2021) Wants PCP okay to get shingrix series Instructed patient she should be fine to get, but will route to PCP for approval

## 2022-03-09 NOTE — Telephone Encounter (Signed)
Ok to get shingles vaccine

## 2022-03-10 DIAGNOSIS — I10 Essential (primary) hypertension: Secondary | ICD-10-CM | POA: Diagnosis not present

## 2022-03-10 DIAGNOSIS — H5200 Hypermetropia, unspecified eye: Secondary | ICD-10-CM | POA: Diagnosis not present

## 2022-03-10 DIAGNOSIS — E119 Type 2 diabetes mellitus without complications: Secondary | ICD-10-CM | POA: Diagnosis not present

## 2022-03-10 DIAGNOSIS — Z0100 Encounter for examination of eyes and vision without abnormal findings: Secondary | ICD-10-CM | POA: Diagnosis not present

## 2022-03-10 DIAGNOSIS — H26492 Other secondary cataract, left eye: Secondary | ICD-10-CM | POA: Diagnosis not present

## 2022-03-10 NOTE — Patient Instructions (Signed)
Visit Information  Following are the goals we discussed today:  Current Barriers:  Unable to independently afford treatment regimen--MEDICARE COVERAGE GAP, HIGH COPAYS Unable to achieve control of T2DM  Suboptimal therapeutic regimen for T2DM   Pharmacist Clinical Goal(s):  Over the next 90 days, patient will verbalize ability to afford treatment regimen achieve control of T2DM as evidenced by GOAL A1C<7%, CONTROLLED BLOOD SUGARS adhere to prescribed medication regimen as evidenced by IMPROVED GLYCEMIC CONTROL through collaboration with PharmD and provider.    Interventions: 1:1 collaboration with Gwenlyn Perking, FNP regarding development and update of comprehensive plan of care as evidenced by provider attestation and co-signature Inter-disciplinary care team collaboration (see longitudinal plan of care) Comprehensive medication review performed; medication list updated in electronic medical record  Diabetes: Controlled--having higher FBG; current treatment: BASAGLAR 10 units daily, HUMALOG 5-6 units w/ meals; TRULICITY '3mg'$  (wednesdays), metformin BGs are trending down--USING LESS INSULIN Patient uses dexcom for CGM CONTINUE Trulicity '3mg'$  sq weekly RE-Enrolled in [place] [facility] patient assistance (until 09/25/22) meds ship to patient's house (trulicity, basaglar, humalog) Denies personal and family history of Medullary thyroid cancer (MTC) TOLERATING WELL (was having nausea but better) CAN INCREASE TO 4.'5MG'$  SQ WEEKLY AS TOLERATED Try increasing Basaglar TO 12 UNITS nightly--to cover higher FBG CONTINUE Humalog 5-6 UNITS WITH MEALS  CONTINUE metformin WOULD LIKE TO TRANSITION PATIENT TO [place], BUT WAITING UNTIL [facility] ADDS TO PATIENT ASSISTANCE C-peptide- level 3.0 post prandial-indicative of T2DM (BUT NOT A DEFINITE) Endocrine recommends labs to see if T1 Vs T2-->ZnT8, GAD, insulin antibodies--NOT COVERED ON INSURANCE, WILL POSTPONE THESE LABS AND ASSESS RESPONSE FROM GLP1  THERAPY New dexcom was given to patient by insurance--patient's malfunctioned  Current glucose readings: fasting glucose: 120-145s--some in the 200s, post prandial glucose: 160-170 reports hyperglycemic symptoms Discussed meal planning options and Plate method for healthy eating (30-45 cabs per meal, 2x15g snacks) Avoid sugary drinks and desserts Incorporate balanced protein, non starchy veggies, 1 serving of carbohydrate with each meal Increase water intake Increase physical activity as able Current exercise: n/a Educated on diet, medication adjustments Assessed patient finances. Enrolled patient in [place] [facility] for basaglar and humalog insulin pens--patient approved for basaglar/humalog/trulicity until 16/38/45-[facility] ship to patient's home  Patient Goals/Self-Care Activities Over the next 90 days, patient will:  - take medications as prescribed check glucose CONTINUOUSLY USING CGM, document, and provide at future appointments engage in dietary modifications by [place] METHOD  Follow Up Plan: Telephone follow up appointment with care management team member scheduled for:  6 months   Plan: Telephone follow up appointment with care management team member scheduled for:  07/2022  Signature Regina Eck, PharmD, BCPS Clinical Pharmacist, [place]  [facility] Phone [phone]   Please call the care guide team at [phone] if you need to cancel or reschedule your appointment.   The patient verbalized understanding of instructions, educational materials, and care plan provided today and DECLINED offer to receive copy of patient instructions, educational materials, and care plan.

## 2022-03-10 NOTE — Telephone Encounter (Signed)
PT R/C 

## 2022-03-10 NOTE — Telephone Encounter (Signed)
Pt aware ok to get Shingrix and will call back to schedule with the Triage Nurse.

## 2022-03-25 DIAGNOSIS — E1165 Type 2 diabetes mellitus with hyperglycemia: Secondary | ICD-10-CM

## 2022-03-25 DIAGNOSIS — E0821 Diabetes mellitus due to underlying condition with diabetic nephropathy: Secondary | ICD-10-CM

## 2022-03-25 DIAGNOSIS — Z794 Long term (current) use of insulin: Secondary | ICD-10-CM

## 2022-04-14 ENCOUNTER — Other Ambulatory Visit: Payer: Self-pay | Admitting: Family Medicine

## 2022-05-04 DIAGNOSIS — Z1231 Encounter for screening mammogram for malignant neoplasm of breast: Secondary | ICD-10-CM | POA: Diagnosis not present

## 2022-05-05 ENCOUNTER — Ambulatory Visit (INDEPENDENT_AMBULATORY_CARE_PROVIDER_SITE_OTHER): Payer: Medicare HMO

## 2022-05-05 ENCOUNTER — Encounter: Payer: Self-pay | Admitting: Family Medicine

## 2022-05-05 ENCOUNTER — Ambulatory Visit (INDEPENDENT_AMBULATORY_CARE_PROVIDER_SITE_OTHER): Payer: Medicare HMO | Admitting: Family Medicine

## 2022-05-05 VITALS — BP 110/80 | HR 96 | Temp 97.5°F | Ht 66.0 in | Wt 192.2 lb

## 2022-05-05 DIAGNOSIS — Z780 Asymptomatic menopausal state: Secondary | ICD-10-CM | POA: Diagnosis not present

## 2022-05-05 DIAGNOSIS — E1159 Type 2 diabetes mellitus with other circulatory complications: Secondary | ICD-10-CM | POA: Diagnosis not present

## 2022-05-05 DIAGNOSIS — E1169 Type 2 diabetes mellitus with other specified complication: Secondary | ICD-10-CM

## 2022-05-05 DIAGNOSIS — I152 Hypertension secondary to endocrine disorders: Secondary | ICD-10-CM | POA: Diagnosis not present

## 2022-05-05 DIAGNOSIS — Z794 Long term (current) use of insulin: Secondary | ICD-10-CM | POA: Diagnosis not present

## 2022-05-05 DIAGNOSIS — E1165 Type 2 diabetes mellitus with hyperglycemia: Secondary | ICD-10-CM | POA: Diagnosis not present

## 2022-05-05 DIAGNOSIS — M85852 Other specified disorders of bone density and structure, left thigh: Secondary | ICD-10-CM | POA: Diagnosis not present

## 2022-05-05 DIAGNOSIS — E785 Hyperlipidemia, unspecified: Secondary | ICD-10-CM

## 2022-05-05 DIAGNOSIS — Z23 Encounter for immunization: Secondary | ICD-10-CM

## 2022-05-05 LAB — CMP14+EGFR
ALT: 12 IU/L (ref 0–32)
AST: 13 IU/L (ref 0–40)
Albumin/Globulin Ratio: 1.6 (ref 1.2–2.2)
Albumin: 4.4 g/dL (ref 3.9–4.9)
Alkaline Phosphatase: 98 IU/L (ref 44–121)
BUN/Creatinine Ratio: 32 — ABNORMAL HIGH (ref 12–28)
BUN: 24 mg/dL (ref 8–27)
Bilirubin Total: 0.8 mg/dL (ref 0.0–1.2)
CO2: 22 mmol/L (ref 20–29)
Calcium: 9.8 mg/dL (ref 8.7–10.3)
Chloride: 98 mmol/L (ref 96–106)
Creatinine, Ser: 0.76 mg/dL (ref 0.57–1.00)
Globulin, Total: 2.8 g/dL (ref 1.5–4.5)
Glucose: 278 mg/dL — ABNORMAL HIGH (ref 70–99)
Potassium: 4.3 mmol/L (ref 3.5–5.2)
Sodium: 137 mmol/L (ref 134–144)
Total Protein: 7.2 g/dL (ref 6.0–8.5)
eGFR: 86 mL/min/{1.73_m2} (ref >59)

## 2022-05-05 LAB — CBC WITH DIFFERENTIAL/PLATELET
Basophils Absolute: 0.1 10*3/uL (ref 0.0–0.2)
Basos: 1 %
EOS (ABSOLUTE): 0.2 10*3/uL (ref 0.0–0.4)
Eos: 3 %
Hematocrit: 45.3 % (ref 34.0–46.6)
Hemoglobin: 14.9 g/dL (ref 11.1–15.9)
Immature Grans (Abs): 0 10*3/uL (ref 0.0–0.1)
Immature Granulocytes: 0 %
Lymphocytes Absolute: 2.5 10*3/uL (ref 0.7–3.1)
Lymphs: 32 %
MCH: 27 pg (ref 26.6–33.0)
MCHC: 32.9 g/dL (ref 31.5–35.7)
MCV: 82 fL (ref 79–97)
Monocytes Absolute: 0.6 10*3/uL (ref 0.1–0.9)
Monocytes: 8 %
Neutrophils Absolute: 4.4 10*3/uL (ref 1.4–7.0)
Neutrophils: 56 %
Platelets: 229 10*3/uL (ref 150–450)
RBC: 5.51 x10E6/uL — ABNORMAL HIGH (ref 3.77–5.28)
RDW: 13.4 % (ref 11.7–15.4)
WBC: 7.7 10*3/uL (ref 3.4–10.8)

## 2022-05-05 LAB — BAYER DCA HB A1C WAIVED: HB A1C (BAYER DCA - WAIVED): 8.4 % — ABNORMAL HIGH (ref 4.8–5.6)

## 2022-05-05 LAB — LIPID PANEL
Chol/HDL Ratio: 4.3 ratio (ref 0.0–4.4)
Cholesterol, Total: 158 mg/dL (ref 100–199)
HDL: 37 mg/dL — ABNORMAL LOW (ref >39)
LDL Chol Calc (NIH): 87 mg/dL (ref 0–99)
Triglycerides: 200 mg/dL — ABNORMAL HIGH (ref 0–149)
VLDL Cholesterol Cal: 34 mg/dL (ref 5–40)

## 2022-05-05 NOTE — Progress Notes (Signed)
Established Patient Office Visit  Subjective:  Patient ID: Rebecca Cook, female    DOB: 02-Nov-1955  Age: 66 y.o. MRN: [id number]  CC:  Chief Complaint  Patient presents with   Medical Management of Chronic Issues   Diabetes   Hyperlipidemia   Hypertension    HPI Rebecca Cook presents for chronic follow up.   DM Patient denies foot ulcerations, hypoglycemia , nausea, paresthesia of the feet, visual disturbances, vomiting, and weight loss.  Current diabetic medications include trulicity 3 mg, metformin 1000 mg BID, humalog 6 units 3x a day, and basaglar 10 mg at night Compliant with meds - Yes  Current monitoring regimen:  2x a day Home blood sugar records: fasting range: <200- this is typical for her, at dinnertime it is usually 110-120 Any episodes of hypoglycemia? rarely  Weight trend: stable Current diet: diabetic, reports the she had to eat out frequently for the last few weeks due to the process of moving and not having a kitchen to cook in. She admits that she did not adhere to her diet well at all during this time. She is going to the grocery store today after her visit to get back on track.   Has got a new puppy and has been going for walks  2. HTN Complaint with meds - Yes Current Medications - losartan-hctz Pertinent ROS:  Visual Disturbances - No Chest pain - No Dyspnea - No Palpitations - No LE edema - No  BP Readings from Last 3 Encounters:  05/05/22 110/80  12/29/21 122/70  10/13/21 130/78   Wt Readings from Last 3 Encounters:  05/05/22 192 lb 4 oz (87.2 kg)  12/29/21 190 lb 6 oz (86.4 kg)  09/08/21 203 lb (92.1 kg)    Past Medical History:  Diagnosis Date   Diabetes mellitus, type II (HCC)    Frequent headaches    GERD (gastroesophageal reflux disease)    Hypertension    Kidney stones    None current, last had kidney stones in 1986    Past Surgical History:  Procedure Laterality Date   TONSILLECTOMY AND ADENOIDECTOMY  1985   TUBAL  LIGATION  1985    Family History  Problem Relation Age of Onset   Cancer Mother        Lung   Heart disease Father    Hypertension Father    Heart disease Paternal Grandmother        Thyroid   Hypertension Paternal Grandmother    Cancer - Other Brother    Diabetes Maternal Grandfather     Social History   Socioeconomic History   Marital status: Married    Spouse name: Not on file   Number of children: 2   Years of education: College   Highest education level: Associate degree: academic program  Occupational History    Employer: [employer]  Tobacco Use   Smoking status: Former   Smokeless tobacco: Never  Substance and Sexual Activity   Alcohol use: Yes    Alcohol/week: 0.0 standard drinks of alcohol    Comment: occ   Drug use: No   Sexual activity: Yes    Birth control/protection: None  Other Topics Concern   Not on file  Social History Narrative   Not on file   Social Determinants of Health   Financial Resource Strain: Low Risk  (05/27/2021)   Overall Financial Resource Strain (CARDIA)    Difficulty of Paying Living Expenses: Not very hard  Food Insecurity: No  Food Insecurity (05/27/2021)   Hunger Vital Sign    Worried About Running Out of Food in the Last Year: Never true    Ran Out of Food in the Last Year: Never true  Transportation Needs: No Transportation Needs (05/27/2021)   PRAPARE - Hydrologist (Medical): No    Lack of Transportation (Non-Medical): No  Physical Activity: Sufficiently Active (05/27/2021)   Exercise Vital Sign    Days of Exercise per Week: 5 days    Minutes of Exercise per Session: 40 min  Stress: Stress Concern Present (05/27/2021)   [place]    Feeling of Stress : To some extent  Social Connections: Moderately Isolated (05/27/2021)   Social Connection and Isolation Panel [NHANES]    Frequency of Communication with Friends and Family: More  than three times a week    Frequency of Social Gatherings with Friends and Family: Once a week    Attends Religious Services: More than 4 times per year    Active Member of [employer] or Organizations: No    Attends Archivist Meetings: Never    Marital Status: Divorced  Human resources officer Violence: Not At Risk (05/27/2021)   Humiliation, Afraid, Rape, and Kick questionnaire    Fear of Current or Ex-Partner: No    Emotionally Abused: No    Physically Abused: No    Sexually Abused: No    Outpatient Medications Prior to Visit  Medication Sig Dispense Refill   cetirizine (ZYRTEC) 10 MG tablet Take 1 tablet (10 mg total) by mouth daily. 30 tablet 11   Dulaglutide (TRULICITY) 3 JS/3.1RX SOPN Inject 3 mg as directed once a week. 6 mL 3   estradiol (ESTRACE VAGINAL) 0.1 MG/GM vaginal cream Place 1 Applicatorful vaginally 3 (three) times a week. 42.5 g 12   gabapentin (NEURONTIN) 600 MG tablet Take 0.5 tablets (300 mg total) by mouth 2 (two) times daily. 45 tablet 3   Insulin Glargine (BASAGLAR KWIKPEN) 100 UNIT/ML Inject 32 Units into the skin at bedtime. (Patient taking differently: Inject 10 Units into the skin at bedtime.) 45 mL 3   insulin lispro (HUMALOG KWIKPEN) 100 UNIT/ML KwikPen Inject 6 Units into the skin 3 (three) times daily. Before each meal, 3x daily. For blood sugar of 140-199: 2 units, 200-250: 4 units, 251-299: 8 units, 300-349: 10 units, for 350 or above: 14 units. 45 mL 5   losartan-hydrochlorothiazide (HYZAAR) 100-25 MG tablet TAKE 1 TABLET BY MOUTH DAILY 90 tablet 1   metFORMIN (GLUCOPHAGE-XR) 500 MG 24 hr tablet TAKE 2 TABLETS (1,000 MG TOTAL) BY MOUTH 2 (TWO) TIMES DAILY WITH A MEAL. 360 tablet 0   Multiple Vitamins-Minerals (MULTIVITAMIN PO) Take 1 tablet by mouth daily.      rosuvastatin (CRESTOR) 10 MG tablet Take 1 tablet (10 mg total) by mouth daily. 90 tablet 3   UNABLE TO FIND Collagen Peptide - 1 scoop qd     No facility-administered medications prior to visit.     No Known Allergies  ROS Review of Systems As per HPI.    Objective:    Physical Exam Vitals and nursing note reviewed.  Constitutional:      General: She is not in acute distress.    Appearance: She is not ill-appearing, toxic-appearing or diaphoretic.  HENT:     Nose: Nose normal.     Mouth/Throat:     Mouth: Mucous membranes are moist.     Pharynx: Oropharynx  is clear.  Cardiovascular:     Rate and Rhythm: Normal rate and regular rhythm.     Heart sounds: Normal heart sounds. No murmur heard. Pulmonary:     Effort: Pulmonary effort is normal. No respiratory distress.     Breath sounds: Normal breath sounds.  Abdominal:     General: Bowel sounds are normal. There is no distension.     Palpations: Abdomen is soft.     Tenderness: There is no abdominal tenderness. There is no guarding or rebound.  Musculoskeletal:     Right lower leg: No edema.     Left lower leg: No edema.  Skin:    General: Skin is warm and dry.  Neurological:     General: No focal deficit present.     Mental Status: She is alert and oriented to person, place, and time.  Psychiatric:        Mood and Affect: Mood normal.        Behavior: Behavior normal.    Diabetic Foot Exam - Simple   Simple Foot Form Diabetic Foot exam was performed with the following findings: Yes 05/05/2022  8:15 AM  Visual Inspection No deformities, no ulcerations, no other skin breakdown bilaterally: Yes Sensation Testing Intact to touch and monofilament testing bilaterally: Yes Pulse Check Posterior Tibialis and Dorsalis pulse intact bilaterally: Yes Comments      BP 110/80 Comment: at home reading per pt  Pulse 96   Temp (!) 97.5 F (36.4 C) (Temporal)   Ht $R'5\' 6"'Cs$  (1.676 m)   Wt 192 lb 4 oz (87.2 kg)   SpO2 96%   BMI 31.03 kg/m  Wt Readings from Last 3 Encounters:  05/05/22 192 lb 4 oz (87.2 kg)  12/29/21 190 lb 6 oz (86.4 kg)  09/08/21 203 lb (92.1 kg)     Health Maintenance Due  Topic Date Due    Zoster Vaccines- Shingrix (1 of 2) Never done   FOOT EXAM  07/01/2021    There are no preventive care reminders to display for this patient.  Lab Results  Component Value Date   TSH 2.11 10/17/2017   Lab Results  Component Value Date   WBC 9.9 12/29/2021   HGB 13.4 12/29/2021   HCT 40.5 12/29/2021   MCV 81 12/29/2021   PLT 335 12/29/2021   Lab Results  Component Value Date   NA 141 12/29/2021   K 3.8 12/29/2021   CO2 23 12/29/2021   GLUCOSE 149 (H) 12/29/2021   BUN 15 12/29/2021   CREATININE 0.83 12/29/2021   BILITOT 0.3 12/29/2021   ALKPHOS 87 12/29/2021   AST 12 12/29/2021   ALT 8 12/29/2021   PROT 6.5 12/29/2021   ALBUMIN 4.0 12/29/2021   CALCIUM 9.5 12/29/2021   ANIONGAP 12 02/19/2020   EGFR 77 12/29/2021   GFR 94.70 03/02/2020   Lab Results  Component Value Date   CHOL 109 12/29/2021   Lab Results  Component Value Date   HDL 37 (L) 12/29/2021   Lab Results  Component Value Date   LDLCALC 39 12/29/2021   Lab Results  Component Value Date   TRIG 210 (H) 12/29/2021   Lab Results  Component Value Date   CHOLHDL 2.9 12/29/2021   Lab Results  Component Value Date   HGBA1C 6.5 (H) 12/29/2021      Assessment & Plan:   Jemma was seen today for medical management of chronic issues, diabetes, hyperlipidemia and hypertension.  Diagnoses and all orders for this visit:  Type 2 diabetes mellitus with hyperglycemia, with long-term current use of insulin (HCC) A1c is 8.4 today, not at goal of <7. Was previously well controlled. Discussed getting back on diabetic diet now that she has completed her move. Discussed titrating insulin up by 2-3 units if needed. Foot exam today. On statin and ARB.  -     CBC with Differential/Platelet -     CMP14+EGFR -     Lipid panel -     Bayer DCA Hb A1c Waived  Hypertension associated with diabetes ([place]) Well controlled on current regimen.  -     CBC with Differential/Platelet -     CMP14+EGFR -     Lipid  panel  Hyperlipidemia associated with type 2 diabetes mellitus ([place]) On statin. Labs pending.  -     Lipid panel  Postmenopausal -     DG [facility] DEXA; Future  Need for vaccination Shingrix today in office.  -     Zoster Recombinant (Shingrix )       Follow-up: No follow-ups on file.  The patient indicates understanding of these issues and agrees with the plan.    Gwenlyn Perking, FNP

## 2022-05-05 NOTE — Patient Instructions (Signed)
Diabetes Mellitus and Foot Care Foot care is an important part of your health, especially when you have diabetes. Diabetes may cause you to have problems because of poor blood flow (circulation) to your feet and legs, which can cause your skin to: Become thinner and drier. Break more easily. Heal more slowly. Peel and crack. You may also have nerve damage (neuropathy) in your legs and feet, causing decreased feeling in them. This means that you may not notice minor injuries to your feet that could lead to more serious problems. Noticing and addressing any potential problems early is the best way to prevent future foot problems. How to care for your feet Foot hygiene  Wash your feet daily with warm water and mild soap. Do not use hot water. Then, pat your feet and the areas between your toes until they are completely dry. Do not soak your feet as this can dry your skin. Trim your toenails straight across. Do not dig under them or around the cuticle. File the edges of your nails with an emery board or nail file. Apply a moisturizing lotion or petroleum jelly to the skin on your feet and to dry, brittle toenails. Use lotion that does not contain alcohol and is unscented. Do not apply lotion between your toes. Shoes and socks Wear clean socks or stockings every day. Make sure they are not too tight. Do not wear knee-high stockings since they may decrease blood flow to your legs. Wear shoes that fit properly and have enough cushioning. Always look in your shoes before you put them on to be sure there are no objects inside. To break in new shoes, wear them for just a few hours a day. This prevents injuries on your feet. Wounds, scrapes, corns, and calluses  Check your feet daily for blisters, cuts, bruises, sores, and redness. If you cannot see the bottom of your feet, use a mirror or ask someone for help. Do not cut corns or calluses or try to remove them with medicine. If you find a minor scrape,  cut, or break in the skin on your feet, keep it and the skin around it clean and dry. You may clean these areas with mild soap and water. Do not clean the area with peroxide, alcohol, or iodine. If you have a wound, scrape, corn, or callus on your foot, look at it several times a day to make sure it is healing and not infected. Check for: Redness, swelling, or pain. Fluid or blood. Warmth. Pus or a bad smell. General tips Do not cross your legs. This may decrease blood flow to your feet. Do not use heating pads or hot water bottles on your feet. They may burn your skin. If you have lost feeling in your feet or legs, you may not know this is happening until it is too late. Protect your feet from hot and cold by wearing shoes, such as at the beach or on hot pavement. Schedule a complete foot exam at least once a year (annually) or more often if you have foot problems. Report any cuts, sores, or bruises to your health care provider immediately. Where to find more information American Diabetes Association: www.diabetes.org Association of Diabetes Care & Education Specialists: www.diabeteseducator.org Contact a health care provider if: You have a medical condition that increases your risk of infection and you have any cuts, sores, or bruises on your feet. You have an injury that is not healing. You have redness on your legs or feet. You   feel burning or tingling in your legs or feet. You have pain or cramps in your legs and feet. Your legs or feet are numb. Your feet always feel cold. You have pain around any toenails. Get help right away if: You have a wound, scrape, corn, or callus on your foot and: You have pain, swelling, or redness that gets worse. You have fluid or blood coming from the wound, scrape, corn, or callus. Your wound, scrape, corn, or callus feels warm to the touch. You have pus or a bad smell coming from the wound, scrape, corn, or callus. You have a fever. You have a red  line going up your leg. Summary Check your feet every day for blisters, cuts, bruises, sores, and redness. Apply a moisturizing lotion or petroleum jelly to the skin on your feet and to dry, brittle toenails. Wear shoes that fit properly and have enough cushioning. If you have foot problems, report any cuts, sores, or bruises to your health care provider immediately. Schedule a complete foot exam at least once a year (annually) or more often if you have foot problems. This information is not intended to replace advice given to you by your health care provider. Make sure you discuss any questions you have with your health care provider. Document Revised: 04/02/2020 Document Reviewed: 04/02/2020 Elsevier Patient Education  2023 Elsevier Inc.  

## 2022-05-19 DIAGNOSIS — Z961 Presence of intraocular lens: Secondary | ICD-10-CM | POA: Diagnosis not present

## 2022-05-19 LAB — HM DIABETES EYE EXAM

## 2022-06-02 ENCOUNTER — Ambulatory Visit: Payer: Medicare Other

## 2022-06-22 ENCOUNTER — Telehealth: Payer: Self-pay | Admitting: Family Medicine

## 2022-06-22 DIAGNOSIS — Z794 Long term (current) use of insulin: Secondary | ICD-10-CM

## 2022-06-22 MED ORDER — TRULICITY 3 MG/0.5ML ~~LOC~~ SOAJ: 3.0000 mg | SUBCUTANEOUS | 3 refills | Status: DC

## 2022-06-22 NOTE — Telephone Encounter (Signed)
Patient can call [phone] to refill It's the same program that she's been in -- [employer]

## 2022-06-22 NOTE — Telephone Encounter (Signed)
Left message to call back  

## 2022-08-02 ENCOUNTER — Telehealth: Payer: Medicare HMO

## 2022-08-02 ENCOUNTER — Telehealth: Payer: Self-pay | Admitting: Pharmacist

## 2022-08-02 DIAGNOSIS — E1165 Type 2 diabetes mellitus with hyperglycemia: Secondary | ICD-10-CM

## 2022-08-02 NOTE — Telephone Encounter (Signed)
Rebecca Cook cares RE-ENROLLMENT CAN YOU SEND PATIENT PORTION Rebecca Cook cares AND SEND ME PROVIDER PORTION TO GET SIGNED: BASAGLAR 15 UNITS DAILY HUMALOG KWIKPEN 6 UNITS INTO SKIN 3 TIMES DAILY WITH MEALS (18 UNITS TOTAL MAX DOSE) TRULICITY '3MG'$  SQ WEEKLY   THANK YOU!

## 2022-08-02 NOTE — Telephone Encounter (Signed)
Hold on application  Patient moving and potential dose changes

## 2022-08-04 ENCOUNTER — Encounter: Payer: Self-pay | Admitting: Family Medicine

## 2022-08-04 ENCOUNTER — Ambulatory Visit (INDEPENDENT_AMBULATORY_CARE_PROVIDER_SITE_OTHER): Payer: Medicare HMO | Admitting: Family Medicine

## 2022-08-04 VITALS — BP 119/75 | HR 91 | Temp 97.5°F | Ht 66.0 in | Wt 187.8 lb

## 2022-08-04 DIAGNOSIS — E1165 Type 2 diabetes mellitus with hyperglycemia: Secondary | ICD-10-CM

## 2022-08-04 DIAGNOSIS — E1169 Type 2 diabetes mellitus with other specified complication: Secondary | ICD-10-CM

## 2022-08-04 DIAGNOSIS — Z23 Encounter for immunization: Secondary | ICD-10-CM | POA: Diagnosis not present

## 2022-08-04 DIAGNOSIS — E6609 Other obesity due to excess calories: Secondary | ICD-10-CM | POA: Diagnosis not present

## 2022-08-04 DIAGNOSIS — E1159 Type 2 diabetes mellitus with other circulatory complications: Secondary | ICD-10-CM | POA: Diagnosis not present

## 2022-08-04 DIAGNOSIS — Z6830 Body mass index (BMI) 30.0-30.9, adult: Secondary | ICD-10-CM | POA: Diagnosis not present

## 2022-08-04 DIAGNOSIS — Z794 Long term (current) use of insulin: Secondary | ICD-10-CM | POA: Diagnosis not present

## 2022-08-04 DIAGNOSIS — I152 Hypertension secondary to endocrine disorders: Secondary | ICD-10-CM | POA: Diagnosis not present

## 2022-08-04 DIAGNOSIS — E66811 Obesity, class 1: Secondary | ICD-10-CM | POA: Insufficient documentation

## 2022-08-04 DIAGNOSIS — E785 Hyperlipidemia, unspecified: Secondary | ICD-10-CM

## 2022-08-04 LAB — BAYER DCA HB A1C WAIVED: HB A1C (BAYER DCA - WAIVED): 9.6 % — ABNORMAL HIGH (ref 4.8–5.6)

## 2022-08-04 NOTE — Addendum Note (Signed)
Addended by: Geryl Rankins D on: 08/04/2022 05:17 PM   Modules accepted: Orders

## 2022-08-04 NOTE — Progress Notes (Signed)
Established Patient Office Visit  Subjective:  Patient ID: Rebecca Cook, female    DOB: [birth year]  Age: 66 y.o. MRN: [id number]  CC:  Chief Complaint  Patient presents with   Medical Management of Chronic Issues    HPI Rebecca Cook presents for chronic follow up.   DM Patient denies foot ulcerations, hypoglycemia , nausea, paresthesia of the feet, visual disturbances, vomiting, and weight loss.  Current diabetic medications include trulicity 3 mg, metformin 1000 mg BID, humalog 8 units 3x a day, and basaglar 10 units at night Compliant with meds - Yes  Current monitoring regimen:  not regularly , she is really hating checking her blood sugars lately due to sticking her finger Home blood sugar records: usually around 200 Any episodes of hypoglycemia? rarely  Weight trend: stable Current diet: has not been sticking to her diet well Has got a new puppy and has been going for walks  2. HTN Complaint with meds - Yes Current Medications - losartan-hctz Pertinent ROS:  Visual Disturbances - No Chest pain - No Dyspnea - No Palpitations - No LE edema - No  BP Readings from Last 3 Encounters:  08/04/22 119/75  05/05/22 110/80  12/29/21 122/70   Wt Readings from Last 3 Encounters:  08/04/22 187 lb 12.8 oz (85.2 kg)  05/05/22 192 lb 4 oz (87.2 kg)  12/29/21 190 lb 6 oz (86.4 kg)    Past Medical History:  Diagnosis Date   Diabetes mellitus, type II (HCC)    Frequent headaches    GERD (gastroesophageal reflux disease)    Hypertension    Kidney stones    None current, last had kidney stones in 1986    Past Surgical History:  Procedure Laterality Date   TONSILLECTOMY AND [place]    Family History  Problem Relation Age of Onset   Cancer Mother        Lung   Heart disease Father    Hypertension Father    Heart disease Paternal Grandmother        Thyroid   Hypertension Paternal Grandmother    Cancer - Other Brother     Diabetes Maternal Grandfather     Social History   Socioeconomic History   Marital status: Married    Spouse name: Not on file   Number of children: 2   Years of education: College   Highest education level: Associate degree: academic program  Occupational History    Employer: [employer]  Tobacco Use   Smoking status: Former   Smokeless tobacco: Never  Substance and Sexual Activity   Alcohol use: Yes    Alcohol/week: 0.0 standard drinks of alcohol    Comment: occ   Drug use: No   Sexual activity: Yes    Birth control/protection: None  Other Topics Concern   Not on file  Social History Narrative   Not on file   Social Determinants of Health   Financial Resource Strain: Low Risk  (05/27/2021)   Overall Financial Resource Strain (CARDIA)    Difficulty of Paying Living Expenses: Not very hard  Food Insecurity: No Food Insecurity (05/27/2021)   Hunger Vital Sign    Worried About Running Out of Food in the Last Year: Never true    Ran Out of Food in the Last Year: Never true  Transportation Needs: No Transportation Needs (05/27/2021)   PRAPARE - Hydrologist (Medical): No  Lack of Transportation (Non-Medical): No  Physical Activity: Sufficiently Active (05/27/2021)   Exercise Vital Sign    Days of Exercise per Week: 5 days    Minutes of Exercise per Session: 40 min  Stress: Stress Concern Present (05/27/2021)   [place]    Feeling of Stress : To some extent  Social Connections: Moderately Isolated (05/27/2021)   Social Connection and Isolation Panel [NHANES]    Frequency of Communication with Friends and Family: More than three times a week    Frequency of Social Gatherings with Friends and Family: Once a week    Attends Religious Services: More than 4 times per year    Active Member of [employer] or Organizations: No    Attends Archivist Meetings: Never    Marital  Status: Divorced  Human resources officer Violence: Not At Risk (05/27/2021)   Humiliation, Afraid, Rape, and Kick questionnaire    Fear of Current or Ex-Partner: No    Emotionally Abused: No    Physically Abused: No    Sexually Abused: No    Outpatient Medications Prior to Visit  Medication Sig Dispense Refill   cetirizine (ZYRTEC) 10 MG tablet Take 1 tablet (10 mg total) by mouth daily. 30 tablet 11   Dulaglutide (TRULICITY) 3 FW/2.6VZ SOPN Inject 3 mg as directed once a week. 6 mL 3   estradiol (ESTRACE VAGINAL) 0.1 MG/GM vaginal cream Place 1 Applicatorful vaginally 3 (three) times a week. 42.5 g 12   gabapentin (NEURONTIN) 600 MG tablet Take 0.5 tablets (300 mg total) by mouth 2 (two) times daily. 45 tablet 3   Insulin Glargine (BASAGLAR KWIKPEN) 100 UNIT/ML Inject 32 Units into the skin at bedtime. (Patient taking differently: Inject 10 Units into the skin at bedtime.) 45 mL 3   insulin lispro (HUMALOG KWIKPEN) 100 UNIT/ML KwikPen Inject 6 Units into the skin 3 (three) times daily. Before each meal, 3x daily. For blood sugar of 140-199: 2 units, 200-250: 4 units, 251-299: 8 units, 300-349: 10 units, for 350 or above: 14 units. 45 mL 5   losartan-hydrochlorothiazide (HYZAAR) 100-25 MG tablet TAKE 1 TABLET BY MOUTH DAILY 90 tablet 1   metFORMIN (GLUCOPHAGE-XR) 500 MG 24 hr tablet TAKE 2 TABLETS (1,000 MG TOTAL) BY MOUTH 2 (TWO) TIMES DAILY WITH A MEAL. 360 tablet 0   Multiple Vitamins-Minerals (MULTIVITAMIN PO) Take 1 tablet by mouth daily.      rosuvastatin (CRESTOR) 10 MG tablet Take 1 tablet (10 mg total) by mouth daily. 90 tablet 3   UNABLE TO FIND Collagen Peptide - 1 scoop qd     No facility-administered medications prior to visit.    No Known Allergies  ROS Review of Systems As per HPI.    Objective:    Physical Exam Vitals and nursing note reviewed.  Constitutional:      General: She is not in acute distress.    Appearance: She is not ill-appearing, toxic-appearing or  diaphoretic.  HENT:     Nose: Nose normal.     Mouth/Throat:     Mouth: Mucous membranes are moist.     Pharynx: Oropharynx is clear.  Cardiovascular:     Rate and Rhythm: Normal rate and regular rhythm.     Heart sounds: Normal heart sounds. No murmur heard. Pulmonary:     Effort: Pulmonary effort is normal. No respiratory distress.     Breath sounds: Normal breath sounds.  Abdominal:     General: Bowel sounds  are normal. There is no distension.     Palpations: Abdomen is soft.     Tenderness: There is no abdominal tenderness. There is no guarding or rebound.  Musculoskeletal:     Right lower leg: No edema.     Left lower leg: No edema.  Skin:    General: Skin is warm and dry.  Neurological:     General: No focal deficit present.     Mental Status: She is alert and oriented to person, place, and time.  Psychiatric:        Mood and Affect: Mood normal.        Behavior: Behavior normal.     BP 119/75   Pulse 91   Temp (!) 97.5 F (36.4 C)   Ht _0  (1.676 m)   Wt 187 lb 12.8 oz (85.2 kg)   SpO2 95%   BMI 30.31 kg/m  Wt Readings from Last 3 Encounters:  08/04/22 187 lb 12.8 oz (85.2 kg)  05/05/22 192 lb 4 oz (87.2 kg)  12/29/21 190 lb 6 oz (86.4 kg)     Health Maintenance Due  Topic Date Due   COLONOSCOPY (Pts 45-87yr Insurance coverage will need to be confirmed)  09/26/2020   Diabetic kidney evaluation - Urine ACR  04/08/2022   Medicare Annual Wellness (AWV)  05/27/2022   Zoster Vaccines- Shingrix (2 of 2) 06/30/2022    There are no preventive care reminders to display for this patient.  Lab Results  Component Value Date   TSH 2.11 10/17/2017   Lab Results  Component Value Date   WBC 7.7 05/05/2022   HGB 14.9 05/05/2022   HCT 45.3 05/05/2022   MCV 82 05/05/2022   PLT 229 05/05/2022   Lab Results  Component Value Date   NA 137 05/05/2022   K 4.3 05/05/2022   CO2 22 05/05/2022   GLUCOSE 278 (H) 05/05/2022   BUN 24 05/05/2022   CREATININE 0.76  05/05/2022   BILITOT 0.8 05/05/2022   ALKPHOS 98 05/05/2022   AST 13 05/05/2022   ALT 12 05/05/2022   PROT 7.2 05/05/2022   ALBUMIN 4.4 05/05/2022   CALCIUM 9.8 05/05/2022   ANIONGAP 12 02/19/2020   EGFR 86 05/05/2022   GFR 94.70 03/02/2020   Lab Results  Component Value Date   CHOL 158 05/05/2022   Lab Results  Component Value Date   HDL 37 (L) 05/05/2022   Lab Results  Component Value Date   LDLCALC 87 05/05/2022   Lab Results  Component Value Date   TRIG 200 (H) 05/05/2022   Lab Results  Component Value Date   CHOLHDL 4.3 05/05/2022   Lab Results  Component Value Date   HGBA1C 8.4 (H) 05/05/2022      Assessment & Plan:    [place] seen today for medical management of chronic issues.  Diagnoses and all orders for this visit:  Type 2 diabetes mellitus with hyperglycemia, with long-term current use of insulin ([place] Obesity Uncontrolled today with A1c of 9.6. Discussed increasing trulicity to 4.5 mg weekly. I will send this to JAlmyra Freeto have the Rx sent in to her assistance program. She is also interested in getting back on to a CGM but is unsure of the cost of $50 a month due to financial limitations. Discussed getting back on diabetic diet. Discussed titrating basal insulin up by 2-3 as well. On statin and ARB. Micro pending.  -     CMP14+EGFR -     CBC  with Differential/Platelet -     Bayer DCA Hb A1c Waived -     Microalbumin / creatinine urine ratio  Hyperlipidemia associated with type 2 diabetes mellitus ([place]) On statin. Labs pending.  -     Lipid panel  Hypertension associated with diabetes ([place]) Well controlled on current regimen.  -     TSH  Need for vaccination Shingrix today and flu in office today.    Follow-up: Return in about 3 months (around 11/04/2022) for chronic follow up. Follow up with Rebecca Cook for CGM and trulicity assistance program.   The patient indicates understanding of these issues and agrees with the plan.    Gwenlyn Perking, FNP

## 2022-08-05 LAB — CBC WITH DIFFERENTIAL/PLATELET
Basophils Absolute: 0.1 10*3/uL (ref 0.0–0.2)
Basos: 1 %
EOS (ABSOLUTE): 0.2 10*3/uL (ref 0.0–0.4)
Eos: 3 %
Hematocrit: 46.2 % (ref 34.0–46.6)
Hemoglobin: 15.1 g/dL (ref 11.1–15.9)
Immature Grans (Abs): 0 10*3/uL (ref 0.0–0.1)
Immature Granulocytes: 0 %
Lymphocytes Absolute: 2.1 10*3/uL (ref 0.7–3.1)
Lymphs: 30 %
MCH: 27.8 pg (ref 26.6–33.0)
MCHC: 32.7 g/dL (ref 31.5–35.7)
MCV: 85 fL (ref 79–97)
Monocytes Absolute: 0.5 10*3/uL (ref 0.1–0.9)
Monocytes: 7 %
Neutrophils Absolute: 4.1 10*3/uL (ref 1.4–7.0)
Neutrophils: 59 %
Platelets: 229 10*3/uL (ref 150–450)
RBC: 5.44 x10E6/uL — ABNORMAL HIGH (ref 3.77–5.28)
RDW: 13.1 % (ref 11.7–15.4)
WBC: 6.9 10*3/uL (ref 3.4–10.8)

## 2022-08-05 LAB — LIPID PANEL
Chol/HDL Ratio: 3.3 ratio (ref 0.0–4.4)
Cholesterol, Total: 136 mg/dL (ref 100–199)
HDL: 41 mg/dL (ref >39)
LDL Chol Calc (NIH): 67 mg/dL (ref 0–99)
Triglycerides: 166 mg/dL — ABNORMAL HIGH (ref 0–149)
VLDL Cholesterol Cal: 28 mg/dL (ref 5–40)

## 2022-08-05 LAB — CMP14+EGFR
ALT: 16 IU/L (ref 0–32)
AST: 13 IU/L (ref 0–40)
Albumin/Globulin Ratio: 1.5 (ref 1.2–2.2)
Albumin: 4.2 g/dL (ref 3.9–4.9)
Alkaline Phosphatase: 91 IU/L (ref 44–121)
BUN/Creatinine Ratio: 26 (ref 12–28)
BUN: 20 mg/dL (ref 8–27)
Bilirubin Total: 0.5 mg/dL (ref 0.0–1.2)
CO2: 22 mmol/L (ref 20–29)
Calcium: 9.9 mg/dL (ref 8.7–10.3)
Chloride: 96 mmol/L (ref 96–106)
Creatinine, Ser: 0.77 mg/dL (ref 0.57–1.00)
Globulin, Total: 2.8 g/dL (ref 1.5–4.5)
Glucose: 297 mg/dL — ABNORMAL HIGH (ref 70–99)
Potassium: 3.7 mmol/L (ref 3.5–5.2)
Sodium: 135 mmol/L (ref 134–144)
Total Protein: 7 g/dL (ref 6.0–8.5)
eGFR: 84 mL/min/{1.73_m2} (ref >59)

## 2022-08-05 LAB — MICROALBUMIN / CREATININE URINE RATIO
Creatinine, Urine: 81.3 mg/dL
Microalb/Creat Ratio: 9 mg/g creat (ref 0–29)
Microalbumin, Urine: 7.4 ug/mL

## 2022-08-05 LAB — TSH: TSH: 1.07 u[IU]/mL (ref 0.450–4.500)

## 2022-08-09 ENCOUNTER — Ambulatory Visit (INDEPENDENT_AMBULATORY_CARE_PROVIDER_SITE_OTHER): Payer: Medicare HMO

## 2022-08-09 VITALS — Ht 66.0 in | Wt 182.0 lb

## 2022-08-09 DIAGNOSIS — Z0000 Encounter for general adult medical examination without abnormal findings: Secondary | ICD-10-CM | POA: Diagnosis not present

## 2022-08-09 NOTE — Progress Notes (Signed)
Subjective:   Rebecca Cook is a 66 y.o. female who presents for Medicare Annual (Subsequent) preventive examination. I connected with  DEKAYLA PRESTRIDGE on 08/09/22 by a audio enabled telemedicine application and verified that I am speaking with the correct person using two identifiers.  Patient Location: Home  Provider Location: Home Office  I discussed the limitations of evaluation and management by telemedicine. The patient expressed understanding and agreed to proceed.  Review of Systems     Cardiac Risk Factors include: advanced age (>46mn, >>25women);diabetes mellitus     Objective:    Today's Vitals   08/09/22 0955  Weight: 182 lb (82.6 kg)  Height: '5\' 6"'$  (1.676 m)   Body mass index is 29.38 kg/m.     08/09/2022   10:01 AM 05/27/2021   10:44 AM 02/17/2020    4:10 AM  Advanced Directives  Does Patient Have a Medical Advance Directive? Yes No No  Type of [place]aramed[place]of [place] will    Copy of [place] Chart? No - copy requested    Would patient like information on creating a medical advance directive?  Yes (MAU/Ambulatory/Procedural Areas - Information given) No - Patient declined    Current Medications (verified) Outpatient Encounter Medications as of 08/09/2022  Medication Sig   cetirizine (ZYRTEC) 10 MG tablet Take 1 tablet (10 mg total) by mouth daily.   Dulaglutide (TRULICITY) 3 MTF/5.7DUSOPN Inject 3 mg as directed once a week.   estradiol (ESTRACE VAGINAL) 0.1 MG/GM vaginal cream Place 1 Applicatorful vaginally 3 (three) times a week.   gabapentin (NEURONTIN) 600 MG tablet Take 0.5 tablets (300 mg total) by mouth 2 (two) times daily.   Insulin Glargine (BASAGLAR KWIKPEN) 100 UNIT/ML Inject 32 Units into the skin at bedtime. (Patient taking differently: Inject 10 Units into the skin at bedtime.)   insulin lispro (HUMALOG KWIKPEN) 100 UNIT/ML KwikPen Inject 6 Units into the skin 3 (three) times daily. Before  each meal, 3x daily. For blood sugar of 140-199: 2 units, 200-250: 4 units, 251-299: 8 units, 300-349: 10 units, for 350 or above: 14 units.   losartan-hydrochlorothiazide (HYZAAR) 100-25 MG tablet TAKE 1 TABLET BY MOUTH DAILY   metFORMIN (GLUCOPHAGE-XR) 500 MG 24 hr tablet TAKE 2 TABLETS (1,000 MG TOTAL) BY MOUTH 2 (TWO) TIMES DAILY WITH A MEAL.   Multiple Vitamins-Minerals (MULTIVITAMIN PO) Take 1 tablet by mouth daily.    rosuvastatin (CRESTOR) 10 MG tablet Take 1 tablet (10 mg total) by mouth daily.   UNABLE TO FIND Collagen Peptide - 1 scoop qd   No facility-administered encounter medications on file as of 08/09/2022.    Allergies (verified) Patient has no known allergies.   History: Past Medical History:  Diagnosis Date   Diabetes mellitus, type II ([place]    Frequent headaches    GERD (gastroesophageal reflux disease)    Hypertension    Kidney stones    None current, last had kidney stones in 1986   Past Surgical History:  Procedure Laterality Date   TON[place]ILLECTOMY AND [place]  Family History  Problem Relation Age of Onset   Cancer Mother        Lung   Heart disease Father    Hypertension Father    Heart disease Paternal Grandmother        Thyroid   Hypertension Paternal Grandmother    Cancer - Other Brother    Diabetes Maternal  Grandfather    Social History   Socioeconomic History   Marital status: Married    Spouse name: Not on file   Number of children: 2   Years of education: College   Highest education level: Associate degree: academic program  Occupational History    Employer: [employer]  Tobacco Use   Smoking status: Former   Smokeless tobacco: Never  Substance and Sexual Activity   Alcohol use: Yes    Alcohol/week: 0.0 standard drinks of alcohol    Comment: occ   Drug use: No   Sexual activity: Yes    Birth control/protection: None  Other Topics Concern   Not on file  Social History Narrative   Not on  file   Social Determinants of Health   Financial Resource Strain: Low Risk  (08/09/2022)   Overall Financial Resource Strain (CARDIA)    Difficulty of Paying Living Expenses: Not hard at all  Food Insecurity: No Food Insecurity (08/09/2022)   Hunger Vital Sign    Worried About Running Out of Food in the Last Year: Never true    Ran Out of Food in the Last Year: Never true  Transportation Needs: No Transportation Needs (08/09/2022)   PRAPARE - Hydrologist (Medical): No    Lack of Transportation (Non-Medical): No  Physical Activity: Sufficiently Active (08/09/2022)   Exercise Vital Sign    Days of Exercise per Week: 5 days    Minutes of Exercise per Session: 30 min  Stress: No Stress Concern Present (08/09/2022)   [place]    Feeling of Stress : Not at all  Social Connections: [place] (08/09/2022)   Social Connection and Isolation Panel [NHANES]    Frequency of Communication with Friends and Family: More than three times a week    Frequency of Social Gatherings with Friends and Family: More than three times a week    Attends Religious Services: More than 4 times per year    Active Member of [employer] or Organizations: Yes    Attends Music therapist: More than 4 times per year    Marital Status: Married    Tobacco Counseling Counseling given: Not Answered   Clinical Intake:  Pre-visit preparation completed: Yes  Pain : No/denies pain     Nutritional Risks: None Diabetes: No  How often do you need to have someone help you when you read instructions, pamphlets, or other written materials from your doctor or pharmacy?: 1 - Never  Diabetic?yes  Nutrition Risk Assessment:  Has the patient had any N/V/D within the last 2 months?  Yes  Does the patient have any non-healing wounds?  No  Has the patient had any unintentional weight loss or weight gain?  No    Diabetes:  Is the patient diabetic?  Yes  If diabetic, was a CBG obtained today?  No  Did the patient bring in their glucometer from home?  No  How often do you monitor your CBG's? 2 x day .   Financial Strains and Diabetes Management:  Are you having any financial strains with the device, your supplies or your medication? No .  Does the patient want to be seen by Chronic Care Management for management of their diabetes?  No  Would the patient like to be referred to a Nutritionist or for Diabetic Management?  No   Diabetic Exams:  Diabetic Eye Exam: Completed 02/2022 Diabetic Foot Exam: Overdue, Pt  has been advised about the importance in completing this exam. Pt is scheduled for diabetic foot exam on next office visit .   Interpreter Needed?: No  Information entered by :: Jadene Pierini, LPN   Activities of Daily Living    08/09/2022   10:01 AM 08/08/2022   10:34 AM  In your present state of health, do you have any difficulty performing the following activities:  Hearing? 0 0  Vision? 0 0  Difficulty concentrating or making decisions? 0 0  Walking or climbing stairs? 0 1  Dressing or bathing? 0 0  Doing errands, shopping? 0 0  Preparing Food and eating ? N N  Using the Toilet? N N  In the past six months, have you accidently leaked urine? N N  Do you have problems with loss of bowel control? N N  Managing your Medications? N N  Managing your Finances? N N  Housekeeping or managing your Housekeeping? N N    Patient Care Team: Gwenlyn Perking, FNP as PCP - General (Family Medicine) Gala Romney Cristopher Estimable, MD as Consulting Physician (Gastroenterology) Lavera Guise, [facility] as Pharmacist (Family Medicine)  Indicate any recent Medical Services you may have received from other than [facility] providers in the past year (date may be approximate).     Assessment:   This is a routine wellness examination for [place].  Hearing/Vision screen Vision Screening - Comments:: Annual eye  exams wear glasses   Dietary issues and exercise activities discussed: Current Exercise Habits: Home exercise routine, Type of exercise: walking, Time (Minutes): 30, Frequency (Times/Week): 5, Weekly Exercise (Minutes/Week): 150, Intensity: Mild, Exercise limited by: None identified   Goals Addressed             This Visit's Progress    DIET - REDUCE SUGAR INTAKE   On track      Depression Screen    08/09/2022    9:59 AM 05/05/2022    8:06 AM 12/29/2021    3:59 PM 09/08/2021   11:50 AM 06/30/2021    4:15 PM 05/27/2021   10:45 AM 04/08/2021   11:01 AM  PHQ 2/9 Scores  PHQ - 2 Score 0 0 0 0 2 3 0  PHQ- 9 Score  0 '7 8 9 10 '$ 0    Fall Risk    08/09/2022    9:58 AM 08/08/2022   10:34 AM 05/05/2022    8:05 AM 12/29/2021    3:59 PM 09/08/2021   11:49 AM  Fall Risk   Falls in the past year? 0 0 1 0 0  Number falls in past yr: 0 0 1    Injury with Fall? 0 0 0    Risk for fall due to : No Fall Risks  History of fall(s)    Follow up Falls prevention discussed  Falls evaluation completed      FALL RISK PREVENTION PERTAINING TO THE HOME:  Any stairs in or around the home? Yes  If so, are there any without handrails? No  Home free of loose throw rugs in walkways, pet beds, electrical cords, etc? Yes  Adequate lighting in your home to reduce risk of falls? Yes   ASSISTIVE DEVICES UTILIZED TO PREVENT FALLS:  Life alert? No  Use of a cane, walker or w/c? No  Grab bars in the bathroom? Yes  Shower chair or bench in shower? No  Elevated toilet seat or a handicapped toilet? No       05/27/2021   10:51 AM  MMSE - Mini Mental State Exam  Orientation to time 5  Orientation to Place 5  Registration 3  Attention/ Calculation 5  Recall 2  Language- name 2 objects 2  Language- repeat 1  Language- follow 3 step command 3  Language- read & follow direction 1  Write a sentence 1  Copy design 1  Total score 29        08/09/2022   10:01 AM 05/27/2021   11:34 AM  6CIT Screen   What Year? 0 points 0 points  What month? 0 points 0 points  What time? 0 points 0 points  Count back from 20 0 points 0 points  Months in reverse 0 points 0 points  Repeat phrase 0 points 0 points  Total Score 0 points 0 points    Immunizations Immunization History  Administered Date(s) Administered   Fluad Quad(high Dose 65+) 08/04/2022   Influenza,inj,Quad PF,6+ Mos 10/17/2017, 06/05/2019   Influenza-Unspecified 07/21/2014, 06/17/2021   Moderna Sars-Covid-2 Vaccination 12/09/2019, 12/29/2019, 08/29/2020   Pneumococcal Conjugate-13 03/16/2020   Pneumococcal Polysaccharide-23 05/27/2021   Pneumococcal-Unspecified 03/17/2021   Tdap 03/16/2020   Unspecified SARS-COV-2 Vaccination 05/27/2021   Zoster Recombinat (Shingrix) 05/05/2022, 08/04/2022    TDAP status: Up to date  Flu Vaccine status: Up to date  Pneumococcal vaccine status: Up to date  Covid-19 vaccine status: Completed vaccines  Qualifies for Shingles Vaccine? Yes   Zostavax completed No   Shingrix Completed?: No.    Education has been provided regarding the importance of this vaccine. Patient has been advised to call insurance company to determine out of pocket expense if they have not yet received this vaccine. Advised may also receive vaccine at local pharmacy or Health Dept. Verbalized acceptance and understanding.  Screening Tests Health Maintenance  Topic Date Due   COLONOSCOPY (Pts 45-68yr Insurance coverage will need to be confirmed)  09/26/2020   COVID-19 Vaccine (5 - Moderna series) 05/22/2023 (Originally 07/22/2021)   HEMOGLOBIN A1C  02/02/2023   MAMMOGRAM  05/05/2023   FOOT EXAM  05/06/2023   OPHTHALMOLOGY EXAM  05/20/2023   Diabetic kidney evaluation - GFR measurement  08/05/2023   Diabetic kidney evaluation - Urine ACR  08/05/2023   Medicare Annual Wellness (AWV)  08/10/2023   TETANUS/TDAP  03/16/2030   Pneumonia Vaccine 68 Years old  Completed   INFLUENZA VACCINE  Completed   DEXA SCAN   Completed   Hepatitis C Screening  Completed   Zoster Vaccines- Shingrix  Completed   HPV VACCINES  Aged Out    Health Maintenance  Health Maintenance Due  Topic Date Due   COLONOSCOPY (Pts 45-464yrInsurance coverage will need to be confirmed)  09/26/2020    Colorectal cancer screening: Type of screening: Cologuard. Completed 01/16/2022. Repeat every 3 years  Mammogram status: Completed 05/04/2022. Repeat every year  Bone Density status: Completed 05/05/2022. Results reflect: Bone density results: OSTEOPENIA. Repeat every 5 years.  Lung Cancer Screening: (Low Dose CT Chest recommended if Age 68-80ears, 30 pack-year currently smoking OR have quit w/in 15years.) does not qualify.   Lung Cancer Screening Referral: n/a  Additional Screening:  Hepatitis C Screening: does not qualify; Completed n/a  Vision Screening: Recommended annual ophthalmology exams for early detection of glaucoma and other disorders of the eye. Is the patient up to date with their annual eye exam?  Yes  Who is the provider or what is the name of the office in which the patient attends annual eye exams? [facility]  If pt  is not established with a provider, would they like to be referred to a provider to establish care? No .   Dental Screening: Recommended annual dental exams for proper oral hygiene  Community Resource Referral / Chronic Care Management: CRR required this visit?  No   CCM required this visit?  No      Plan:     I have personally reviewed and noted the following in the patient's chart:   Medical and social history Use of alcohol, tobacco or illicit drugs  Current medications and supplements including opioid prescriptions. Patient is not currently taking opioid prescriptions. Functional ability and status Nutritional status Physical activity Advanced directives List of other physicians Hospitalizations, surgeries, and ER visits in previous 12 months Vitals Screenings to  include cognitive, depression, and falls Referrals and appointments  In addition, I have reviewed and discussed with patient certain preventive protocols, quality metrics, and best practice recommendations. A written personalized care plan for preventive services as well as general preventive health recommendations were provided to patient.     Daphane Shepherd, LPN   [id number]   Nurse Notes: none

## 2022-08-09 NOTE — Patient Instructions (Signed)
Rebecca Cook , Thank you for taking time to come for your Medicare Wellness Visit. I appreciate your ongoing commitment to your health goals. Please review the following plan we discussed and let me know if I can assist you in the future.   These are the goals we discussed:  Goals       DIET - REDUCE SUGAR INTAKE      T2DM (pt-stated)      Current Barriers:  Unable to independently afford treatment regimen--MEDICARE COVERAGE GAP, HIGH COPAYS Unable to achieve control of T2DM  Suboptimal therapeutic regimen for T2DM   Pharmacist Clinical Goal(s):  Over the next 90 days, patient will verbalize ability to afford treatment regimen achieve control of T2DM as evidenced by GOAL A1C<7%, CONTROLLED BLOOD SUGARS adhere to prescribed medication regimen as evidenced by IMPROVED GLYCEMIC CONTROL through collaboration with PharmD and provider.    Interventions: 1:1 collaboration with Gwenlyn Perking, FNP regarding development and update of comprehensive plan of care as evidenced by provider attestation and co-signature Inter-disciplinary care team collaboration (see longitudinal plan of care) Comprehensive medication review performed; medication list updated in electronic medical record  Diabetes: Controlled--having higher FBG; current treatment: BASAGLAR 10 units daily, HUMALOG 5-6 units w/ meals; TRULICITY '3mg'$  (wednesdays), metformin BGs are trending down--USING LESS INSULIN Patient uses dexcom for CGM CONTINUE Trulicity '3mg'$  sq weekly RE-Enrolled in [facility] patient assistance (until 09/25/22) meds ship to patient's house (trulicity, basaglar, humalog) Denies personal and family history of Medullary thyroid cancer (MTC) TOLERATING WELL (was having nausea but better) CAN INCREASE TO 4.'5MG'$  SQ WEEKLY AS TOLERATED Try increasing Basaglar TO 12 UNITS nightly--to cover higher FBG CONTINUE Humalog 5-6 UNITS WITH MEALS  CONTINUE metformin WOULD LIKE TO TRANSITION PATIENT TO [facility], BUT WAITING  UNTIL [facility] ADDS TO PATIENT ASSISTANCE C-peptide- level 3.0 post prandial-indicative of T2DM (BUT NOT A DEFINITE) Endocrine recommends labs to see if T1 Vs T2-->ZnT8, GAD, insulin antibodies--NOT COVERED ON INSURANCE, WILL POSTPONE THESE LABS AND ASSESS RESPONSE FROM GLP1 THERAPY New dexcom was given to patient by insurance--patient's malfunctioned  Current glucose readings: fasting glucose: 120-145s--some in the 200s, post prandial glucose: 160-170 reports hyperglycemic symptoms Discussed meal planning options and Plate method for healthy eating (30-45 cabs per meal, 2x15g snacks) Avoid sugary drinks and desserts Incorporate balanced protein, non starchy veggies, 1 serving of carbohydrate with each meal Increase water intake Increase physical activity as able Current exercise: n/a Educated on diet, medication adjustments Assessed patient finances. Enrolled patient in [facility] [employer] for basaglar and humalog insulin pens--patient approved for basaglar/humalog/trulicity until 19/41/74-YCXK ship to patient's home  Patient Goals/Self-Care Activities Over the next 90 days, patient will:  - take medications as prescribed check glucose CONTINUOUSLY USING CGM, document, and provide at future appointments engage in dietary modifications by [place] METHOD  Follow Up Plan: Telephone follow up appointment with care management team member scheduled for:  6 months         This is a list of the screening recommended for you and due dates:  Health Maintenance  Topic Date Due   Colon Cancer Screening  09/26/2020   COVID-19 Vaccine (5 - Moderna series) 05/22/2023*   Hemoglobin A1C  02/02/2023   Mammogram  05/05/2023   Complete foot exam   05/06/2023   Eye exam for diabetics  05/20/2023   Yearly kidney function blood test for diabetes  08/05/2023   Yearly kidney health urinalysis for diabetes  08/05/2023   Medicare Annual Wellness Visit  08/10/2023    Tetanus Vaccine  03/16/2030   Pneumonia Vaccine  Completed   Flu Shot  Completed   DEXA scan (bone density measurement)  Completed   Hepatitis C Screening: USPSTF Recommendation to screen - Ages 66-79 yo.  Completed   Zoster (Shingles) Vaccine  Completed   HPV Vaccine  Aged Out  *Topic was postponed. The date shown is not the original due date.    Advanced directives: Please bring a copy of your health care power of attorney and living will to the office to be added to your chart at your convenience.   Conditions/risks identified: Aim for 30 minutes of exercise or brisk walking, 6-8 glasses of water, and 5 servings of fruits and vegetables each day.   Next appointment: Follow up in one year for your annual wellness visit    Preventive Care 65 Years and Older, Female Preventive care refers to lifestyle choices and visits with your health care provider that can promote health and wellness. What does preventive care include? A yearly physical exam. This is also called an annual well check. Dental exams once or twice a year. Routine eye exams. Ask your health care provider how often you should have your eyes checked. Personal lifestyle choices, including: Daily care of your teeth and gums. Regular physical activity. Eating a healthy diet. Avoiding tobacco and drug use. Limiting alcohol use. Practicing safe sex. Taking low-dose aspirin every day. Taking vitamin and mineral supplements as recommended by your health care provider. What happens during an annual well check? The services and screenings done by your health care provider during your annual well check will depend on your age, overall health, lifestyle risk factors, and family history of disease. Counseling  Your health care provider may ask you questions about your: Alcohol use. Tobacco use. Drug use. Emotional well-being. Home and relationship well-being. Sexual activity. Eating habits. History of falls. Memory and  ability to understand (cognition). Work and work Statistician. Reproductive health. Screening  You may have the following tests or measurements: Height, weight, and BMI. Blood pressure. Lipid and cholesterol levels. These may be checked every 5 years, or more frequently if you are over 67 years old. Skin check. Lung cancer screening. You may have this screening every year starting at age 41 if you have a 30-pack-year history of smoking and currently smoke or have quit within the past 15 years. Fecal occult blood test (FOBT) of the stool. You may have this test every year starting at age 9. Flexible sigmoidoscopy or colonoscopy. You may have a sigmoidoscopy every 5 years or a colonoscopy every 10 years starting at age 26. Hepatitis C blood test. Hepatitis B blood test. Sexually transmitted disease (STD) testing. Diabetes screening. This is done by checking your blood sugar (glucose) after you have not eaten for a while (fasting). You may have this done every 1-3 years. Bone density scan. This is done to screen for osteoporosis. You may have this done starting at age 53. Mammogram. This may be done every 1-2 years. Talk to your health care provider about how often you should have regular mammograms. Talk with your health care provider about your test results, treatment options, and if necessary, the need for more tests. Vaccines  Your health care provider may recommend certain vaccines, such as: Influenza vaccine. This is recommended every year. Tetanus, diphtheria, and acellular pertussis (Tdap, Td) vaccine. You may need a Td booster every 10 years. Zoster vaccine. You may need this after age 36. Pneumococcal 13-valent  conjugate (PCV13) vaccine. One dose is recommended after age 89. Pneumococcal polysaccharide (PPSV23) vaccine. One dose is recommended after age 43. Talk to your health care provider about which screenings and vaccines you need and how often you need them. This information is  not intended to replace advice given to you by your health care provider. Make sure you discuss any questions you have with your health care provider. Document Released: 10/09/2015 Document Revised: 06/01/2016 Document Reviewed: 07/14/2015 Elsevier Interactive Patient Education  2017 [employer] Prevention in the Home Falls can cause injuries. They can happen to people of all ages. There are many things you can do to make your home safe and to help prevent falls. What can I do on the outside of my home? Regularly fix the edges of walkways and driveways and fix any cracks. Remove anything that might make you trip as you walk through a door, such as a raised step or threshold. Trim any bushes or trees on the path to your home. Use bright outdoor lighting. Clear any walking paths of anything that might make someone trip, such as rocks or tools. Regularly check to see if handrails are loose or broken. Make sure that both sides of any steps have handrails. Any raised decks and porches should have guardrails on the edges. Have any leaves, snow, or ice cleared regularly. Use sand or salt on walking paths during winter. Clean up any spills in your garage right away. This includes oil or grease spills. What can I do in the bathroom? Use night lights. Install grab bars by the toilet and in the tub and shower. Do not use towel bars as grab bars. Use non-skid mats or decals in the tub or shower. If you need to sit down in the shower, use a plastic, non-slip stool. Keep the floor dry. Clean up any water that spills on the floor as soon as it happens. Remove soap buildup in the tub or shower regularly. Attach bath mats securely with double-sided non-slip rug tape. Do not have throw rugs and other things on the floor that can make you trip. What can I do in the bedroom? Use night lights. Make sure that you have a light by your bed that is easy to reach. Do not use any sheets or blankets that  are too big for your bed. They should not hang down onto the floor. Have a firm chair that has side arms. You can use this for support while you get dressed. Do not have throw rugs and other things on the floor that can make you trip. What can I do in the kitchen? Clean up any spills right away. Avoid walking on wet floors. Keep items that you use a lot in easy-to-reach places. If you need to reach something above you, use a strong step stool that has a grab bar. Keep electrical cords out of the way. Do not use floor polish or wax that makes floors slippery. If you must use wax, use non-skid floor wax. Do not have throw rugs and other things on the floor that can make you trip. What can I do with my stairs? Do not leave any items on the stairs. Make sure that there are handrails on both sides of the stairs and use them. Fix handrails that are broken or loose. Make sure that handrails are as long as the stairways. Check any carpeting to make sure that it is firmly attached to the stairs. Fix any carpet that  is loose or worn. Avoid having throw rugs at the top or bottom of the stairs. If you do have throw rugs, attach them to the floor with carpet tape. Make sure that you have a light switch at the top of the stairs and the bottom of the stairs. If you do not have them, ask someone to add them for you. What else can I do to help prevent falls? Wear shoes that: Do not have high heels. Have rubber bottoms. Are comfortable and fit you well. Are closed at the toe. Do not wear sandals. If you use a stepladder: Make sure that it is fully opened. Do not climb a closed stepladder. Make sure that both sides of the stepladder are locked into place. Ask someone to hold it for you, if possible. Clearly mark and make sure that you can see: Any grab bars or handrails. First and last steps. Where the edge of each step is. Use tools that help you move around (mobility aids) if they are needed. These  include: Canes. Walkers. Scooters. Crutches. Turn on the lights when you go into a dark area. Replace any light bulbs as soon as they burn out. Set up your furniture so you have a clear path. Avoid moving your furniture around. If any of your floors are uneven, fix them. If there are any pets around you, be aware of where they are. Review your medicines with your doctor. Some medicines can make you feel dizzy. This can increase your chance of falling. Ask your doctor what other things that you can do to help prevent falls. This information is not intended to replace advice given to you by your health care provider. Make sure you discuss any questions you have with your health care provider. Document Released: 07/09/2009 Document Revised: 02/18/2016 Document Reviewed: 10/17/2014 Elsevier Interactive Patient Education  2017 [employer].

## 2022-08-12 ENCOUNTER — Ambulatory Visit: Payer: Medicare Other

## 2022-08-15 NOTE — Telephone Encounter (Signed)
Needs to talk to Almyra Free about [employer] application.

## 2022-08-16 MED ORDER — TRULICITY 3 MG/0.5ML ~~LOC~~ SOAJ: 4.5000 mg | SUBCUTANEOUS | 3 refills | Status: DC

## 2022-08-16 NOTE — Telephone Encounter (Signed)
Call returned Patient to increase trulicity to 4.'5mg'$  sq weekly  Can we send [facility] re-enrollment for: Basaglar 32 units qhs Humalog kwikpen 10 units 3 times daily Trulicity 4.'5mg'$   Thank you!

## 2022-08-25 NOTE — Telephone Encounter (Signed)
Mailed application to pts home

## 2022-08-26 ENCOUNTER — Other Ambulatory Visit: Payer: Self-pay | Admitting: Family Medicine

## 2022-08-26 DIAGNOSIS — E0821 Diabetes mellitus due to underlying condition with diabetic nephropathy: Secondary | ICD-10-CM

## 2022-10-05 DIAGNOSIS — U071 COVID-19: Secondary | ICD-10-CM | POA: Diagnosis not present

## 2022-10-05 DIAGNOSIS — R0981 Nasal congestion: Secondary | ICD-10-CM | POA: Diagnosis not present

## 2022-10-06 ENCOUNTER — Telehealth: Payer: Self-pay | Admitting: Pharmacist

## 2022-10-06 DIAGNOSIS — Z794 Long term (current) use of insulin: Secondary | ICD-10-CM

## 2022-10-06 MED ORDER — TRULICITY 4.5 MG/0.5ML ~~LOC~~ SOAJ: 4.5000 mg | SUBCUTANEOUS | 5 refills | Status: DC

## 2022-10-06 NOTE — Telephone Encounter (Signed)
NEW RX FOR TRULICITY 4.'5MG'$  SENT TO [facility] ([facility] PATIENT [facility]IST[facility])

## 2022-10-11 NOTE — Telephone Encounter (Signed)
REC'D RETURNED APP FROM PATIENT. FAXED PCP PAGES TO OFFICE.

## 2022-10-25 ENCOUNTER — Telehealth: Payer: Self-pay | Admitting: Family Medicine

## 2022-10-25 ENCOUNTER — Other Ambulatory Visit (HOSPITAL_COMMUNITY): Payer: Self-pay

## 2022-10-25 NOTE — Telephone Encounter (Signed)
Patient called requesting to speak with Rosendo Gros regarding patient assistance paperwork that she is being told got lost.

## 2022-10-25 NOTE — Telephone Encounter (Signed)
Spoke with patient regarding Scientist, forensic. She was worried her application was lost due to her sending the re-enrollment forms (mailed from [facility]) back in November, without the pcp pages done. Company told her application wasn't seen. Let her know I sent her completed application for re-enrollment recently and we all should hear back asap. Patient was glad an expressed appreciation.

## 2022-11-03 NOTE — Telephone Encounter (Signed)
Received notification from [employer] regarding approval for Rebecca Cook, TRULICITY 4.'5MG'$ , & [place]. Patient assistance approved from 10/25/22 to 09/26/23.  Phone: [phone]

## 2022-11-10 ENCOUNTER — Ambulatory Visit: Payer: Medicare HMO | Admitting: Family Medicine

## 2022-12-08 ENCOUNTER — Ambulatory Visit: Payer: Medicare HMO | Admitting: Family Medicine

## 2022-12-15 ENCOUNTER — Other Ambulatory Visit: Payer: Self-pay | Admitting: Family Medicine

## 2022-12-22 ENCOUNTER — Ambulatory Visit (INDEPENDENT_AMBULATORY_CARE_PROVIDER_SITE_OTHER): Payer: Medicare HMO | Admitting: Family Medicine

## 2022-12-22 VITALS — BP 138/81 | HR 79 | Ht 66.0 in | Wt 188.0 lb

## 2022-12-22 DIAGNOSIS — E1159 Type 2 diabetes mellitus with other circulatory complications: Secondary | ICD-10-CM

## 2022-12-22 DIAGNOSIS — E1169 Type 2 diabetes mellitus with other specified complication: Secondary | ICD-10-CM

## 2022-12-22 DIAGNOSIS — E785 Hyperlipidemia, unspecified: Secondary | ICD-10-CM | POA: Diagnosis not present

## 2022-12-22 DIAGNOSIS — Z794 Long term (current) use of insulin: Secondary | ICD-10-CM

## 2022-12-22 DIAGNOSIS — E1165 Type 2 diabetes mellitus with hyperglycemia: Secondary | ICD-10-CM

## 2022-12-22 DIAGNOSIS — I152 Hypertension secondary to endocrine disorders: Secondary | ICD-10-CM | POA: Diagnosis not present

## 2022-12-22 DIAGNOSIS — Z7985 Long-term (current) use of injectable non-insulin antidiabetic drugs: Secondary | ICD-10-CM

## 2022-12-22 LAB — BAYER DCA HB A1C WAIVED: HB A1C (BAYER DCA - WAIVED): 8.4 % — ABNORMAL HIGH (ref 4.8–5.6)

## 2022-12-22 NOTE — Progress Notes (Signed)
Established Patient Office Visit  Subjective:  Patient ID: Rebecca Cook, female    DOB: 1955-10-10  Age: 67 y.o. MRN: [id number]  CC:  Chief Complaint  Patient presents with   Medical Management of Chronic Issues   Diabetes   Hyperlipidemia   Hypertension    HPI Rebecca Cook presents for chronic follow up.   DM Patient denies foot ulcerations, hypoglycemia , nausea, paresthesia of the feet, visual disturbances, vomiting, and weight loss.  Current diabetic medications include trulicity 4.5 mg x 1 month, metformin 1000 mg BID, humalog 8 units 3x a day, and basaglar 10 units at night Compliant with meds - Yes  Current monitoring regimen:  daily Home blood sugar records: last few days <125 Any episodes of hypoglycemia? rarely  Weight trend: stable Current diet: Got off track for a few weeks when her father passed and she lost her job. She has been back on track for the last 2 weeks and her blood sugar readings have been well controlled since getting back on track with her diet.   2. HTN Complaint with meds - Yes Current Medications - losartan-hctz Pertinent ROS:  Visual Disturbances - No Chest pain - No Dyspnea - No Palpitations - No LE edema - No      12/22/2022   12:57 PM 08/09/2022    9:59 AM 05/05/2022    8:06 AM  Depression screen PHQ 2/9  Decreased Interest 0 0 0  Down, Depressed, Hopeless 0 0 0  PHQ - 2 Score 0 0 0  Altered sleeping 3  0  Tired, decreased energy 3  0  Change in appetite 0  0  Feeling bad or failure about yourself  0  0  Trouble concentrating 0  0  Moving slowly or fidgety/restless 0  0  Suicidal thoughts 0  0  PHQ-9 Score 6  0  Difficult doing work/chores Somewhat difficult  Not difficult at all      12/22/2022   12:57 PM 05/05/2022    8:18 AM 12/29/2021    4:00 PM 09/08/2021   11:51 AM  GAD 7 : Generalized Anxiety Score  Nervous, Anxious, on Edge 0 0 0 1  Control/stop worrying 1 0 0 1  Worry too much - different things 1 0 0 1   Trouble relaxing 1 1 0 1  Restless 0 0 0 1  Easily annoyed or irritable 1 0 0 1  Afraid - awful might happen 0 0 0 0  Total GAD 7 Score 4 1 0 6  Anxiety Difficulty Somewhat difficult Somewhat difficult Not difficult at all Somewhat difficult      Past Medical History:  Diagnosis Date   Diabetes mellitus, type II (HCC)    Frequent headaches    GERD (gastroesophageal reflux disease)    Hypertension    Kidney stones    None current, last had kidney stones in 1986    Past Surgical History:  Procedure Laterality Date   TONSILLECTOMY AND ADENOIDECTOMY  1985   TUBAL LIGATION  1985    Family History  Problem Relation Age of Onset   Cancer Mother        Lung   Heart disease Father    Hypertension Father    Heart disease Paternal Grandmother        Thyroid   Hypertension Paternal Grandmother    Cancer - Other Brother    Diabetes Maternal Grandfather     Social History   Socioeconomic History  Marital status: Married    Spouse name: Not on file   Number of children: 2   Years of education: College   Highest education level: Associate degree: occupational, Scientist, product/process developmenttechnical, or vocational program  Occupational History    Employer: [employer]  Tobacco Use   Smoking status: Former   Smokeless tobacco: Never  Substance and Sexual Activity   Alcohol use: Yes    Alcohol/week: 0.0 standard drinks of alcohol    Comment: occ   Drug use: No   Sexual activity: Yes    Birth control/protection: None  Other Topics Concern   Not on file  Social History Narrative   Not on file   Social Determinants of Health   Financial Resource Strain: Medium Risk (12/19/2022)   Overall Financial Resource Strain (CARDIA)    Difficulty of Paying Living Expenses: Somewhat hard  Food Insecurity: No Food Insecurity (12/19/2022)   Hunger Vital Sign    Worried About Running Out of Food in the Last Year: Never true    Ran Out of Food in the Last Year: Never true  Transportation Needs: No  Transportation Needs (12/19/2022)   PRAPARE - Administrator, Civil ServiceTransportation    Lack of Transportation (Medical): No    Lack of Transportation (Non-Medical): No  Physical Activity: Insufficiently Active (12/19/2022)   Exercise Vital Sign    Days of Exercise per Week: 4 days    Minutes of Exercise per Session: 30 min  Stress: Stress Concern Present (12/19/2022)   [employer] - Occupational Stress Questionnaire    Feeling of Stress : To some extent  Social Connections: Socially Integrated (12/19/2022)   Social Connection and Isolation Panel [NHANES]    Frequency of Communication with Friends and Family: More than three times a week    Frequency of Social Gatherings with Friends and Family: Once a week    Attends Religious Services: More than 4 times per year    Active Member of [employer] FinancialClubs or Organizations: Yes    Attends Engineer, structuralClub or Organization Meetings: More than 4 times per year    Marital Status: Married  Catering managerntimate Partner Violence: Not At Risk (08/09/2022)   Humiliation, Afraid, Rape, and Kick questionnaire    Fear of Current or Ex-Partner: No    Emotionally Abused: No    Physically Abused: No    Sexually Abused: No    Outpatient Medications Prior to Visit  Medication Sig Dispense Refill   cetirizine (ZYRTEC) 10 MG tablet Take 1 tablet (10 mg total) by mouth daily. 30 tablet 11   Dulaglutide (TRULICITY) 4.5 MG/0.5ML SOPN Inject 4.5 mg as directed once a week. 6 mL 5   estradiol (ESTRACE VAGINAL) 0.1 MG/GM vaginal cream Place 1 Applicatorful vaginally 3 (three) times a week. 42.5 g 12   gabapentin (NEURONTIN) 600 MG tablet Take 0.5 tablets (300 mg total) by mouth 2 (two) times daily. 45 tablet 3   Insulin Glargine (BASAGLAR KWIKPEN) 100 UNIT/ML Inject 32 Units into the skin at bedtime. (Patient taking differently: Inject 10 Units into the skin at bedtime.) 45 mL 3   insulin lispro (HUMALOG KWIKPEN) 100 UNIT/ML KwikPen Inject 6 Units into the skin 3 (three) times daily. Before each  meal, 3x daily. For blood sugar of 140-199: 2 units, 200-250: 4 units, 251-299: 8 units, 300-349: 10 units, for 350 or above: 14 units. 45 mL 5   losartan-hydrochlorothiazide (HYZAAR) 100-25 MG tablet TAKE 1 TABLET BY MOUTH EVERY DAY 90 tablet 1   metFORMIN (GLUCOPHAGE-XR) 500 MG 24  hr tablet TAKE 2 TABLETS (1,000 MG TOTAL) BY MOUTH 2 (TWO) TIMES DAILY WITH A MEAL. 360 tablet 0   Multiple Vitamins-Minerals (MULTIVITAMIN PO) Take 1 tablet by mouth daily.      rosuvastatin (CRESTOR) 10 MG tablet Take 1 tablet (10 mg total) by mouth daily. 90 tablet 3   UNABLE TO FIND Collagen Peptide - 1 scoop qd     No facility-administered medications prior to visit.    No Known Allergies  ROS Review of Systems As per HPI.    Objective:    Physical Exam Vitals and nursing note reviewed.  Constitutional:      General: She is not in acute distress.    Appearance: She is not ill-appearing, toxic-appearing or diaphoretic.  HENT:     Nose: Nose normal.     Mouth/Throat:     Mouth: Mucous membranes are moist.     Pharynx: Oropharynx is clear.  Cardiovascular:     Rate and Rhythm: Normal rate and regular rhythm.     Heart sounds: Normal heart sounds. No murmur heard. Pulmonary:     Effort: Pulmonary effort is normal. No respiratory distress.     Breath sounds: Normal breath sounds.  Abdominal:     General: Bowel sounds are normal. There is no distension.     Palpations: Abdomen is soft.     Tenderness: There is no abdominal tenderness. There is no guarding or rebound.  Musculoskeletal:     Right lower leg: No edema.     Left lower leg: No edema.  Skin:    General: Skin is warm and dry.  Neurological:     General: No focal deficit present.     Mental Status: She is alert and oriented to person, place, and time.  Psychiatric:        Mood and Affect: Mood normal.        Behavior: Behavior normal.     Ht 5\' 6"  (1.676 m)   BMI 29.38 kg/m  Wt Readings from Last 3 Encounters:  08/09/22 182  lb (82.6 kg)  08/04/22 187 lb 12.8 oz (85.2 kg)  05/05/22 192 lb 4 oz (87.2 kg)     Health Maintenance Due  Topic Date Due   COLONOSCOPY (Pts 45-5780yrs Insurance coverage will need to be confirmed)  09/26/2020   COVID-19 Vaccine (5 - 2023-24 season) 05/27/2022    There are no preventive care reminders to display for this patient.  Lab Results  Component Value Date   TSH 1.070 08/04/2022   Lab Results  Component Value Date   WBC 6.9 08/04/2022   HGB 15.1 08/04/2022   HCT 46.2 08/04/2022   MCV 85 08/04/2022   PLT 229 08/04/2022   Lab Results  Component Value Date   NA 135 08/04/2022   K 3.7 08/04/2022   CO2 22 08/04/2022   GLUCOSE 297 (H) 08/04/2022   BUN 20 08/04/2022   CREATININE 0.77 08/04/2022   BILITOT 0.5 08/04/2022   ALKPHOS 91 08/04/2022   AST 13 08/04/2022   ALT 16 08/04/2022   PROT 7.0 08/04/2022   ALBUMIN 4.2 08/04/2022   CALCIUM 9.9 08/04/2022   ANIONGAP 12 02/19/2020   EGFR 84 08/04/2022   GFR 94.70 03/02/2020   Lab Results  Component Value Date   CHOL 136 08/04/2022   Lab Results  Component Value Date   HDL 41 08/04/2022   Lab Results  Component Value Date   LDLCALC 67 08/04/2022   Lab Results  Component Value  Date   TRIG 166 (H) 08/04/2022   Lab Results  Component Value Date   CHOLHDL 3.3 08/04/2022   Lab Results  Component Value Date   HGBA1C 9.6 (H) 08/04/2022      Assessment & Plan:   Rebecca Cook was seen today for medical management of chronic issues, diabetes, hyperlipidemia and hypertension.  Diagnoses and all orders for this visit:  Type 2 diabetes mellitus with hyperglycemia, with long-term current use of insulin (HCC) A1c 8.4 today, not at goal of <7. Medication changes today: none, continue current regimen. We discussed compliance with diet to control blood sugars, she declined changes in medication regime today. She is on an ACE/ARB and statin. Eye exam: UTD. Foot exam: UTD. Urine micro: UTD. Diet and exercise.  -      Bayer DCA Hb A1c Waived  Hyperlipidemia associated with type 2 diabetes mellitus (HCC) Last LDL at 67. Diet and exercise.   Hypertension associated with diabetes (HCC) Well controlled on current regimen.    Follow-up: Return in about 3 months (around 03/24/2023).  The patient indicates understanding of these issues and agrees with the plan.    Gabriel Earing, FNP

## 2023-01-03 ENCOUNTER — Encounter: Payer: Self-pay | Admitting: Family Medicine

## 2023-01-21 ENCOUNTER — Other Ambulatory Visit: Payer: Self-pay | Admitting: Family Medicine

## 2023-01-21 DIAGNOSIS — E1140 Type 2 diabetes mellitus with diabetic neuropathy, unspecified: Secondary | ICD-10-CM

## 2023-02-09 DIAGNOSIS — Z961 Presence of intraocular lens: Secondary | ICD-10-CM | POA: Diagnosis not present

## 2023-02-09 DIAGNOSIS — H52223 Regular astigmatism, bilateral: Secondary | ICD-10-CM | POA: Diagnosis not present

## 2023-02-09 DIAGNOSIS — E083292 Diabetes mellitus due to underlying condition with mild nonproliferative diabetic retinopathy without macular edema, left eye: Secondary | ICD-10-CM | POA: Diagnosis not present

## 2023-02-09 DIAGNOSIS — H5203 Hypermetropia, bilateral: Secondary | ICD-10-CM | POA: Diagnosis not present

## 2023-02-09 DIAGNOSIS — H524 Presbyopia: Secondary | ICD-10-CM | POA: Diagnosis not present

## 2023-02-09 LAB — HM DIABETES EYE EXAM

## 2023-03-01 DIAGNOSIS — Z0100 Encounter for examination of eyes and vision without abnormal findings: Secondary | ICD-10-CM | POA: Diagnosis not present

## 2023-03-04 ENCOUNTER — Other Ambulatory Visit: Payer: Self-pay | Admitting: Family Medicine

## 2023-03-04 DIAGNOSIS — E1169 Type 2 diabetes mellitus with other specified complication: Secondary | ICD-10-CM

## 2023-03-04 DIAGNOSIS — E0821 Diabetes mellitus due to underlying condition with diabetic nephropathy: Secondary | ICD-10-CM

## 2023-03-04 DIAGNOSIS — E1140 Type 2 diabetes mellitus with diabetic neuropathy, unspecified: Secondary | ICD-10-CM

## 2023-03-24 ENCOUNTER — Ambulatory Visit: Payer: Medicare HMO | Admitting: Family Medicine

## 2023-04-05 ENCOUNTER — Encounter: Payer: Self-pay | Admitting: Family Medicine

## 2023-04-05 ENCOUNTER — Ambulatory Visit (INDEPENDENT_AMBULATORY_CARE_PROVIDER_SITE_OTHER): Payer: Medicare HMO | Admitting: Family Medicine

## 2023-04-05 VITALS — BP 110/90 | HR 91 | Temp 97.5°F | Resp 20 | Ht 66.0 in | Wt 187.1 lb

## 2023-04-05 DIAGNOSIS — E1159 Type 2 diabetes mellitus with other circulatory complications: Secondary | ICD-10-CM | POA: Diagnosis not present

## 2023-04-05 DIAGNOSIS — E6609 Other obesity due to excess calories: Secondary | ICD-10-CM | POA: Diagnosis not present

## 2023-04-05 DIAGNOSIS — E1169 Type 2 diabetes mellitus with other specified complication: Secondary | ICD-10-CM | POA: Diagnosis not present

## 2023-04-05 DIAGNOSIS — F320 Major depressive disorder, single episode, mild: Secondary | ICD-10-CM | POA: Diagnosis not present

## 2023-04-05 DIAGNOSIS — E1165 Type 2 diabetes mellitus with hyperglycemia: Secondary | ICD-10-CM | POA: Diagnosis not present

## 2023-04-05 DIAGNOSIS — Z794 Long term (current) use of insulin: Secondary | ICD-10-CM | POA: Diagnosis not present

## 2023-04-05 DIAGNOSIS — G4701 Insomnia due to medical condition: Secondary | ICD-10-CM | POA: Diagnosis not present

## 2023-04-05 DIAGNOSIS — Z6830 Body mass index (BMI) 30.0-30.9, adult: Secondary | ICD-10-CM | POA: Diagnosis not present

## 2023-04-05 DIAGNOSIS — I152 Hypertension secondary to endocrine disorders: Secondary | ICD-10-CM

## 2023-04-05 DIAGNOSIS — E785 Hyperlipidemia, unspecified: Secondary | ICD-10-CM | POA: Diagnosis not present

## 2023-04-05 LAB — BAYER DCA HB A1C WAIVED: HB A1C (BAYER DCA - WAIVED): 8.8 % — ABNORMAL HIGH (ref 4.8–5.6)

## 2023-04-05 MED ORDER — FREESTYLE LIBRE 3 SENSOR MISC
1.0000 | 3 refills | Status: DC
Start: 2023-04-05 — End: 2024-03-19

## 2023-04-05 NOTE — Progress Notes (Signed)
Established Patient Office Visit  Subjective:  Patient ID: Rebecca Cook, female    DOB: April 07, 1956  Age: 67 y.o. MRN: [id number]  CC:  Chief Complaint  Patient presents with   Medical Management of Chronic Issues    HPI [name] presents for chronic follow up.   DM Patient denies foot ulcerations, hypoglycemia , nausea, paresthesia of the feet, visual disturbances, vomiting, and weight loss.  Current diabetic medications include trulicity 4.5 mg, metformin 1000 mg BID, humalog 8 units 3x a day, and basaglar 10 units at night Compliant with meds - Yes  Current monitoring regimen:  daily Home blood sugar records: fasting: around 90-100. <200 in the evening Any episodes of hypoglycemia? Once a week  Weight trend: stable Current diet: better with diet but recently was on vacation where she did indulge. She did try to increase her insulin more during this time.   She has started going to [employer] daily.   2. HTN Complaint with meds - Yes Current Medications - losartan-hydrochlorothiazide BP at home- 110/90 Pertinent ROS:  Visual Disturbances - No Chest pain - No Dyspnea - No Palpitations - No LE edema - No  3. Insomnia She has tried trazodone for sleep. This has helped her tremendously since she tried it again. She feels much better.      04/05/2023    2:24 PM 12/22/2022   12:57 PM 08/09/2022    9:59 AM  Depression screen PHQ 2/9  Decreased Interest 0 0 0  Down, Depressed, Hopeless 0 0 0  PHQ - 2 Score 0 0 0  Altered sleeping 0 3   Tired, decreased energy 0 3   Change in appetite 0 0   Feeling bad or failure about yourself  0 0   Trouble concentrating 0 0   Moving slowly or fidgety/restless 0 0   Suicidal thoughts 0 0   PHQ-9 Score 0 6   Difficult doing work/chores  Somewhat difficult       04/05/2023    2:25 PM 12/22/2022   12:57 PM 05/05/2022    8:18 AM 12/29/2021    4:00 PM  GAD 7 : Generalized Anxiety Score  Nervous, Anxious, on Edge 0 0 0 0   Control/stop worrying 0 1 0 0  Worry too much - different things 0 1 0 0  Trouble relaxing 0 1 1 0  Restless 0 0 0 0  Easily annoyed or irritable 0 1 0 0  Afraid - awful might happen 0 0 0 0  Total GAD 7 Score 0 4 1 0  Anxiety Difficulty  Somewhat difficult Somewhat difficult Not difficult at all      Past Medical History:  Diagnosis Date   Diabetes mellitus, type II (HCC)    Frequent headaches    GERD (gastroesophageal reflux disease)    Hypertension    Kidney stones    None current, last had kidney stones in 1986    Past Surgical History:  Procedure Laterality Date   TONSILLECTOMY AND ADENOIDECTOMY  1985   TUBAL LIGATION  1985    Family History  Problem Relation Age of Onset   Cancer Mother        Lung   Heart disease Father    Hypertension Father    Heart disease Paternal Grandmother        Thyroid   Hypertension Paternal Grandmother    Cancer - Other Brother    Diabetes Maternal Grandfather     Social History  Socioeconomic History   Marital status: Married    Spouse name: Not on file   Number of children: 2   Years of education: College   Highest education level: Associate degree: occupational, Scientist, product/process development, or vocational program  Occupational History    Employer: [employer]  Tobacco Use   Smoking status: Former   Smokeless tobacco: Never  Substance and Sexual Activity   Alcohol use: Yes    Alcohol/week: 0.0 standard drinks of alcohol    Comment: occ   Drug use: No   Sexual activity: Yes    Birth control/protection: None  Other Topics Concern   Not on file  Social History Narrative   Not on file   Social Determinants of Health   Financial Resource Strain: Medium Risk (12/19/2022)   Overall Financial Resource Strain (CARDIA)    Difficulty of Paying Living Expenses: Somewhat hard  Food Insecurity: No Food Insecurity (12/19/2022)   Hunger Vital Sign    Worried About Running Out of Food in the Last Year: Never true    Ran Out of Food in  the Last Year: Never true  Transportation Needs: No Transportation Needs (12/19/2022)   PRAPARE - Administrator, Civil Service (Medical): No    Lack of Transportation (Non-Medical): No  Physical Activity: Insufficiently Active (12/19/2022)   Exercise Vital Sign    Days of Exercise per Week: 4 days    Minutes of Exercise per Session: 30 min  Stress: Stress Concern Present (12/19/2022)   [employer] of Occupational Health - Occupational Stress Questionnaire    Feeling of Stress : To some extent  Social Connections: Socially Integrated (12/19/2022)   Social Connection and Isolation Panel [NHANES]    Frequency of Communication with Friends and Family: More than three times a week    Frequency of Social Gatherings with Friends and Family: Once a week    Attends Religious Services: More than 4 times per year    Active Member of [employer] or Organizations: Yes    Attends Engineer, structural: More than 4 times per year    Marital Status: Married  Catering manager Violence: Not At Risk (08/09/2022)   Humiliation, Afraid, Rape, and Kick questionnaire    Fear of Current or Ex-Partner: No    Emotionally Abused: No    Physically Abused: No    Sexually Abused: No    Outpatient Medications Prior to Visit  Medication Sig Dispense Refill   Dulaglutide (TRULICITY) 4.5 MG/0.5ML SOPN Inject 4.5 mg as directed once a week. 6 mL 5   estradiol (ESTRACE VAGINAL) 0.1 MG/GM vaginal cream Place 1 Applicatorful vaginally 3 (three) times a week. 42.5 g 12   gabapentin (NEURONTIN) 600 MG tablet TAKE 0.5 TABLET (300 MG TOTAL) BY MOUTH 2 (TWO) TIMES DAILY. 45 tablet 0   Insulin Glargine (BASAGLAR KWIKPEN) 100 UNIT/ML Inject 32 Units into the skin at bedtime. (Patient taking differently: Inject 10 Units into the skin at bedtime.) 45 mL 3   insulin lispro (HUMALOG KWIKPEN) 100 UNIT/ML KwikPen Inject 6 Units into the skin 3 (three) times daily. Before each meal, 3x daily. For blood sugar of  140-199: 2 units, 200-250: 4 units, 251-299: 8 units, 300-349: 10 units, for 350 or above: 14 units. (Patient taking differently: Inject 8 Units into the skin 3 (three) times daily. Before each meal, 3x daily. For blood sugar of 140-199: 2 units, 200-250: 4 units, 251-299: 8 units, 300-349: 10 units, for 350 or above: 14 units.) 45 mL  5   loratadine (CLARITIN) 10 MG tablet Take 10 mg by mouth daily.     losartan-hydrochlorothiazide (HYZAAR) 100-25 MG tablet TAKE 1 TABLET BY MOUTH EVERY DAY 90 tablet 0   metFORMIN (GLUCOPHAGE-XR) 500 MG 24 hr tablet TAKE 2 TABLETS (1,000 MG TOTAL) BY MOUTH 2 (TWO) TIMES DAILY WITH A MEAL. (Patient taking differently: Take 500 mg by mouth 2 (two) times daily with a meal.) 360 tablet 0   Multiple Vitamins-Minerals (MULTIVITAMIN PO) Take 1 tablet by mouth daily.      rosuvastatin (CRESTOR) 10 MG tablet TAKE 1 TABLET BY MOUTH EVERY DAY 90 tablet 0   TRAZODONE & DIET MANAGE PROD PO      UNABLE TO FIND Collagen Peptide - 1 scoop qd     cetirizine (ZYRTEC) 10 MG tablet Take 1 tablet (10 mg total) by mouth daily. 30 tablet 11   No facility-administered medications prior to visit.    No Known Allergies  ROS Review of Systems As per HPI.    Objective:    Physical Exam Vitals and nursing note reviewed.  Constitutional:      General: She is not in acute distress.    Appearance: She is not ill-appearing, toxic-appearing or diaphoretic.  HENT:     Nose: Nose normal.     Mouth/Throat:     Mouth: Mucous membranes are moist.     Pharynx: Oropharynx is clear.  Cardiovascular:     Rate and Rhythm: Normal rate and regular rhythm.     Heart sounds: Normal heart sounds. No murmur heard. Pulmonary:     Effort: Pulmonary effort is normal. No respiratory distress.     Breath sounds: Normal breath sounds.  Abdominal:     General: Bowel sounds are normal. There is no distension.     Palpations: Abdomen is soft.     Tenderness: There is no abdominal tenderness. There is  no guarding or rebound.  Musculoskeletal:     Right lower leg: No edema.     Left lower leg: No edema.  Skin:    General: Skin is warm and dry.  Neurological:     General: No focal deficit present.     Mental Status: She is alert and oriented to person, place, and time.  Psychiatric:        Mood and Affect: Mood normal.        Behavior: Behavior normal.     BP (!) 141/72   Pulse 91   Temp (!) 97.5 F (36.4 C) (Oral)   Resp 20   Ht 5\' 6"  (1.676 m)   Wt 187 lb 2 oz (84.9 kg)   SpO2 93%   BMI 30.20 kg/m  BP Readings from Last 3 Encounters:  04/05/23 (!) 141/72  12/22/22 138/81  08/04/22 119/75      Health Maintenance Due  Topic Date Due   COVID-19 Vaccine (5 - 2023-24 season) 05/27/2022    There are no preventive care reminders to display for this patient.  Lab Results  Component Value Date   TSH 1.070 08/04/2022   Lab Results  Component Value Date   WBC 6.9 08/04/2022   HGB 15.1 08/04/2022   HCT 46.2 08/04/2022   MCV 85 08/04/2022   PLT 229 08/04/2022   Lab Results  Component Value Date   NA 135 08/04/2022   K 3.7 08/04/2022   CO2 22 08/04/2022   GLUCOSE 297 (H) 08/04/2022   BUN 20 08/04/2022   CREATININE 0.77 08/04/2022   BILITOT  0.5 08/04/2022   ALKPHOS 91 08/04/2022   AST 13 08/04/2022   ALT 16 08/04/2022   PROT 7.0 08/04/2022   ALBUMIN 4.2 08/04/2022   CALCIUM 9.9 08/04/2022   ANIONGAP 12 02/19/2020   EGFR 84 08/04/2022   GFR 94.70 03/02/2020   Lab Results  Component Value Date   CHOL 136 08/04/2022   Lab Results  Component Value Date   HDL 41 08/04/2022   Lab Results  Component Value Date   LDLCALC 67 08/04/2022   Lab Results  Component Value Date   TRIG 166 (H) 08/04/2022   Lab Results  Component Value Date   CHOLHDL 3.3 08/04/2022   Lab Results  Component Value Date   HGBA1C 8.4 (H) 12/22/2022      Assessment & Plan:   Jaice was seen today for medical management of chronic issues.  Diagnoses and all orders for  this visit:  Type 2 diabetes mellitus with hyperglycemia, with long-term current use of insulin (HCC) A1c 8.8 today, not at goal of <7. Medication changes today: increase basal insulin to 12 units nightly. CGM ordered as she avoids checking her blood sugar due to fingerstick. She in on an ACE/ARB and statin. Eye exam: UTD. Foot exam: UTD. Urine micro: UTD. Diet and exercise.  -     Bayer DCA Hb A1c Waived; Future -     Bayer DCA Hb A1c Waived -     Continuous Glucose Sensor (FREESTYLE LIBRE 3 SENSOR) MISC; 1 each by Does not apply route every 14 (fourteen) days.  Hyperlipidemia associated with type 2 diabetes mellitus (HCC) Last LDL at 67. On statin.   Hypertension associated with diabetes (HCC) BP not at goal today, but well controlled at home. Monitor BP at home and notify for elevated readings.  Class 1 obesity due to excess calories with serious comorbidity and body mass index (BMI) of 30.0 to 30.9 in adult She has started exercising 6x a week.   Insomnia due to medical condition Well controlled with trazodone.   Depression Resolved.   Follow-up: Return in about 3 months (around 07/06/2023) for chronic follow up.  The patient indicates understanding of these issues and agrees with the plan.    Gabriel Earing, FNP

## 2023-04-20 DIAGNOSIS — Z01419 Encounter for gynecological examination (general) (routine) without abnormal findings: Secondary | ICD-10-CM | POA: Diagnosis not present

## 2023-04-20 DIAGNOSIS — Z6831 Body mass index (BMI) 31.0-31.9, adult: Secondary | ICD-10-CM | POA: Diagnosis not present

## 2023-04-20 DIAGNOSIS — L900 Lichen sclerosus et atrophicus: Secondary | ICD-10-CM | POA: Diagnosis not present

## 2023-05-06 ENCOUNTER — Other Ambulatory Visit: Payer: Self-pay | Admitting: Family Medicine

## 2023-05-06 DIAGNOSIS — E1140 Type 2 diabetes mellitus with diabetic neuropathy, unspecified: Secondary | ICD-10-CM

## 2023-05-11 DIAGNOSIS — Z1231 Encounter for screening mammogram for malignant neoplasm of breast: Secondary | ICD-10-CM | POA: Diagnosis not present

## 2023-05-12 DIAGNOSIS — R87612 Low grade squamous intraepithelial lesion on cytologic smear of cervix (LGSIL): Secondary | ICD-10-CM | POA: Diagnosis not present

## 2023-05-15 DIAGNOSIS — E119 Type 2 diabetes mellitus without complications: Secondary | ICD-10-CM | POA: Diagnosis not present

## 2023-05-15 DIAGNOSIS — M8588 Other specified disorders of bone density and structure, other site: Secondary | ICD-10-CM | POA: Diagnosis not present

## 2023-05-16 ENCOUNTER — Encounter: Payer: Self-pay | Admitting: Family Medicine

## 2023-05-24 ENCOUNTER — Telehealth: Payer: Self-pay | Admitting: Family Medicine

## 2023-05-24 NOTE — Telephone Encounter (Signed)
Pt called requesting PCP send her in a Rx for Trazadone. Pt uses [facility] in [place]. Says she has already discussed this with PCP so she doesn't need an appt.

## 2023-05-25 ENCOUNTER — Other Ambulatory Visit: Payer: Self-pay | Admitting: Family Medicine

## 2023-05-25 DIAGNOSIS — G4701 Insomnia due to medical condition: Secondary | ICD-10-CM

## 2023-05-25 MED ORDER — TRAZODONE HCL 50 MG PO TABS
25.0000 mg | ORAL_TABLET | Freq: Every evening | ORAL | 3 refills | Status: DC | PRN
Start: 2023-05-25 — End: 2023-06-19

## 2023-05-25 NOTE — Telephone Encounter (Signed)
Rx sent 

## 2023-05-25 NOTE — Telephone Encounter (Signed)
Pt aware and verbalized understanding.  

## 2023-05-26 ENCOUNTER — Encounter: Payer: Self-pay | Admitting: Family Medicine

## 2023-06-10 ENCOUNTER — Other Ambulatory Visit: Payer: Self-pay | Admitting: Family Medicine

## 2023-06-14 MED ORDER — METFORMIN HCL ER 500 MG PO TB24: 1000.0000 mg | ORAL_TABLET | Freq: Two times a day (BID) | ORAL | 0 refills | Status: DC

## 2023-06-17 ENCOUNTER — Other Ambulatory Visit: Payer: Self-pay | Admitting: Family Medicine

## 2023-06-17 DIAGNOSIS — G4701 Insomnia due to medical condition: Secondary | ICD-10-CM

## 2023-07-06 ENCOUNTER — Ambulatory Visit: Payer: Medicare HMO | Admitting: Family Medicine

## 2023-07-06 ENCOUNTER — Encounter: Payer: Self-pay | Admitting: Family Medicine

## 2023-07-06 VITALS — BP 122/73 | HR 87 | Temp 98.6°F | Ht 66.0 in | Wt 191.4 lb

## 2023-07-06 DIAGNOSIS — E1165 Type 2 diabetes mellitus with hyperglycemia: Secondary | ICD-10-CM

## 2023-07-06 DIAGNOSIS — Z23 Encounter for immunization: Secondary | ICD-10-CM | POA: Diagnosis not present

## 2023-07-06 DIAGNOSIS — Z794 Long term (current) use of insulin: Secondary | ICD-10-CM

## 2023-07-06 DIAGNOSIS — E6609 Other obesity due to excess calories: Secondary | ICD-10-CM | POA: Diagnosis not present

## 2023-07-06 DIAGNOSIS — I152 Hypertension secondary to endocrine disorders: Secondary | ICD-10-CM | POA: Diagnosis not present

## 2023-07-06 DIAGNOSIS — Z6830 Body mass index (BMI) 30.0-30.9, adult: Secondary | ICD-10-CM

## 2023-07-06 DIAGNOSIS — E66811 Obesity, class 1: Secondary | ICD-10-CM

## 2023-07-06 DIAGNOSIS — G4701 Insomnia due to medical condition: Secondary | ICD-10-CM

## 2023-07-06 DIAGNOSIS — E1169 Type 2 diabetes mellitus with other specified complication: Secondary | ICD-10-CM | POA: Diagnosis not present

## 2023-07-06 DIAGNOSIS — R0982 Postnasal drip: Secondary | ICD-10-CM

## 2023-07-06 DIAGNOSIS — E1159 Type 2 diabetes mellitus with other circulatory complications: Secondary | ICD-10-CM | POA: Diagnosis not present

## 2023-07-06 DIAGNOSIS — G629 Polyneuropathy, unspecified: Secondary | ICD-10-CM | POA: Diagnosis not present

## 2023-07-06 DIAGNOSIS — E785 Hyperlipidemia, unspecified: Secondary | ICD-10-CM

## 2023-07-06 LAB — BAYER DCA HB A1C WAIVED: HB A1C (BAYER DCA - WAIVED): 7.4 % — ABNORMAL HIGH (ref 4.8–5.6)

## 2023-07-06 MED ORDER — FLUTICASONE PROPIONATE 50 MCG/ACT NA SUSP
2.0000 | Freq: Every day | NASAL | 6 refills | Status: AC
Start: 2023-07-06 — End: ?

## 2023-07-06 NOTE — Progress Notes (Signed)
Established Patient Office Visit  Subjective:  Patient ID: ILER LO, female    DOB: [birth year]  Age: 67 y.o. MRN: [id number]  CC:  Chief Complaint  Patient presents with   Medical Management of Chronic Issues   Diabetes    HPI Rebecca Cook presents for chronic follow up.   DM Patient denies foot ulcerations, hypoglycemia , nausea, paresthesia of the feet, visual disturbances, vomiting, and weight loss.   Current diabetic medications include trulicity 4.5 mg, metformin 500 mg BID,  (doesn't tolerate higher dosages) humalog 8 units 3x a day, and basaglar 12 units at night Compliant with meds - Yes  Current monitoring regimen: CGM Home blood sugar records: Time in target 71% Any episodes of hypoglycemia? Sometimes after exercise  Weight trend: stable Current diet:   She has started going to [employer] daily. Hx of neuropathy. She takes gabapentin 300 mg BID with complete relief. She does feel tired during the day however.   2. HTN Complaint with meds - Yes Current Medications - losartan-hydrochlorothiazide BP at home- 110/80s Pertinent ROS:  Visual Disturbances - No Chest pain - No Dyspnea - No Palpitations - No LE edema - No  3. Insomnia She has tried trazodone for sleep. She reports well controlled without side effects.      07/06/2023    3:45 PM 04/05/2023    2:24 PM 12/22/2022   12:57 PM  Depression screen PHQ 2/9  Decreased Interest 0 0 0  Down, Depressed, Hopeless 0 0 0  PHQ - 2 Score 0 0 0  Altered sleeping 0 0 3  Tired, decreased energy 0 0 3  Change in appetite 0 0 0  Feeling bad or failure about yourself  0 0 0  Trouble concentrating 0 0 0  Moving slowly or fidgety/restless 0 0 0  Suicidal thoughts 0 0 0  PHQ-9 Score 0 0 6  Difficult doing work/chores Not difficult at all  Somewhat difficult      07/06/2023    3:46 PM 04/05/2023    2:25 PM 12/22/2022   12:57 PM 05/05/2022    8:18 AM  GAD 7 : Generalized Anxiety Score  Nervous,  Anxious, on Edge 0 0 0 0  Control/stop worrying 0 0 1 0  Worry too much - different things 0 0 1 0  Trouble relaxing 0 0 1 1  Restless 0 0 0 0  Easily annoyed or irritable 0 0 1 0  Afraid - awful might happen 0 0 0 0  Total GAD 7 Score 0 0 4 1  Anxiety Difficulty Not difficult at all  Somewhat difficult Somewhat difficult      Past Medical History:  Diagnosis Date   Diabetes mellitus, type II (HCC)    Frequent headaches    GERD (gastroesophageal reflux disease)    Hypertension    Kidney stones    None current, last had kidney stones in 1986    Past Surgical History:  Procedure Laterality Date   TONSILLECTOMY AND ADENOIDECTOMY  1985   TUBAL LIGATION  1985    Family History  Problem Relation Age of Onset   Cancer Mother        Lung   Heart disease Father    Hypertension Father    Heart disease Paternal Grandmother        Thyroid   Hypertension Paternal Grandmother    Cancer - Other Brother    Diabetes Maternal Grandfather     Social History  Socioeconomic History   Marital status: Married    Spouse name: Not on file   Number of children: 2   Years of education: College   Highest education level: Associate degree: occupational, Scientist, product/process development, or vocational program  Occupational History    Employer: [employer]  Tobacco Use   Smoking status: Former   Smokeless tobacco: Never  Substance and Sexual Activity   Alcohol use: Yes    Alcohol/week: 0.0 standard drinks of alcohol    Comment: occ   Drug use: No   Sexual activity: Yes    Birth control/protection: None  Other Topics Concern   Not on file  Social History Narrative   Not on file   Social Determinants of Health   Financial Resource Strain: Medium Risk (12/19/2022)   Overall Financial Resource Strain (CARDIA)    Difficulty of Paying Living Expenses: Somewhat hard  Food Insecurity: No Food Insecurity (12/19/2022)   Hunger Vital Sign    Worried About Running Out of Food in the Last Year: Never true     Ran Out of Food in the Last Year: Never true  Transportation Needs: No Transportation Needs (12/19/2022)   PRAPARE - Administrator, Civil Service (Medical): No    Lack of Transportation (Non-Medical): No  Physical Activity: Insufficiently Active (12/19/2022)   Exercise Vital Sign    Days of Exercise per Week: 4 days    Minutes of Exercise per Session: 30 min  Stress: Stress Concern Present (12/19/2022)   [employer] of Occupational Health - Occupational Stress Questionnaire    Feeling of Stress : To some extent  Social Connections: Socially Integrated (12/19/2022)   Social Connection and Isolation Panel [NHANES]    Frequency of Communication with Friends and Family: More than three times a week    Frequency of Social Gatherings with Friends and Family: Once a week    Attends Religious Services: More than 4 times per year    Active Member of [employer] or Organizations: Yes    Attends Engineer, structural: More than 4 times per year    Marital Status: Married  Catering manager Violence: Not At Risk (08/09/2022)   Humiliation, Afraid, Rape, and Kick questionnaire    Fear of Current or Ex-Partner: No    Emotionally Abused: No    Physically Abused: No    Sexually Abused: No    Outpatient Medications Prior to Visit  Medication Sig Dispense Refill   Continuous Glucose Sensor (FREESTYLE LIBRE 3 SENSOR) MISC 1 each by Does not apply route every 14 (fourteen) days. 6 each 3   Dulaglutide (TRULICITY) 4.5 MG/0.5ML SOPN Inject 4.5 mg as directed once a week. 6 mL 5   gabapentin (NEURONTIN) 600 MG tablet TAKE 0.5 TABLET (300 MG TOTAL) BY MOUTH 2 (TWO) TIMES DAILY. 45 tablet 0   Insulin Glargine (BASAGLAR KWIKPEN) 100 UNIT/ML Inject 32 Units into the skin at bedtime. (Patient taking differently: Inject 10 Units into the skin at bedtime.) 45 mL 3   insulin lispro (HUMALOG KWIKPEN) 100 UNIT/ML KwikPen Inject 6 Units into the skin 3 (three) times daily. Before each meal, 3x  daily. For blood sugar of 140-199: 2 units, 200-250: 4 units, 251-299: 8 units, 300-349: 10 units, for 350 or above: 14 units. (Patient taking differently: Inject 8 Units into the skin 3 (three) times daily. Before each meal, 3x daily. For blood sugar of 140-199: 2 units, 200-250: 4 units, 251-299: 8 units, 300-349: 10 units, for 350 or above: 14  units.) 45 mL 5   loratadine (CLARITIN) 10 MG tablet Take 10 mg by mouth daily.     losartan-hydrochlorothiazide (HYZAAR) 100-25 MG tablet TAKE 1 TABLET BY MOUTH EVERY DAY 90 tablet 0   metFORMIN (GLUCOPHAGE-XR) 500 MG 24 hr tablet Take 2 tablets (1,000 mg total) by mouth 2 (two) times daily with a meal. 360 tablet 0   Multiple Vitamins-Minerals (MULTIVITAMIN PO) Take 1 tablet by mouth daily.      rosuvastatin (CRESTOR) 10 MG tablet TAKE 1 TABLET BY MOUTH EVERY DAY 90 tablet 0   traZODone (DESYREL) 50 MG tablet Take 0.5-1 tablets (25-50 mg total) by mouth at bedtime as needed for sleep. 90 tablet 3   estradiol (ESTRACE VAGINAL) 0.1 MG/GM vaginal cream Place 1 Applicatorful vaginally 3 (three) times a week. 42.5 g 12   TRAZODONE & DIET MANAGE PROD PO      UNABLE TO FIND Collagen Peptide - 1 scoop qd     No facility-administered medications prior to visit.    No Known Allergies  ROS Review of Systems As per HPI.    Objective:    Physical Exam Vitals and nursing note reviewed.  Constitutional:      General: She is not in acute distress.    Appearance: She is not ill-appearing, toxic-appearing or diaphoretic.  HENT:     Nose: Nose normal.     Mouth/Throat:     Mouth: Mucous membranes are moist.     Pharynx: Oropharynx is clear.  Cardiovascular:     Rate and Rhythm: Normal rate and regular rhythm.     Heart sounds: Normal heart sounds. No murmur heard. Pulmonary:     Effort: Pulmonary effort is normal. No respiratory distress.     Breath sounds: Normal breath sounds.  Abdominal:     General: Bowel sounds are normal. There is no distension.      Palpations: Abdomen is soft.     Tenderness: There is no abdominal tenderness. There is no guarding or rebound.  Musculoskeletal:     Right lower leg: No edema.     Left lower leg: No edema.  Skin:    General: Skin is warm and dry.  Neurological:     General: No focal deficit present.     Mental Status: She is alert and oriented to person, place, and time.  Psychiatric:        Mood and Affect: Mood normal.        Behavior: Behavior normal.     Ht 5\' 6"  (1.676 m)   Wt 191 lb 6 oz (86.8 kg)   BMI 30.89 kg/m  BP Readings from Last 3 Encounters:  04/05/23 (!) 110/90  12/22/22 138/81  08/04/22 119/75      Health Maintenance Due  Topic Date Due   INFLUENZA VACCINE  04/27/2023   FOOT EXAM  05/06/2023   OPHTHALMOLOGY EXAM  05/20/2023   COVID-19 Vaccine (5 - 2023-24 season) 05/28/2023   Diabetic kidney evaluation - eGFR measurement  08/05/2023   Diabetic kidney evaluation - Urine ACR  08/05/2023   Medicare Annual Wellness (AWV)  08/10/2023    There are no preventive care reminders to display for this patient.  Lab Results  Component Value Date   TSH 1.070 08/04/2022   Lab Results  Component Value Date   WBC 6.9 08/04/2022   HGB 15.1 08/04/2022   HCT 46.2 08/04/2022   MCV 85 08/04/2022   PLT 229 08/04/2022   Lab Results  Component Value Date  NA 135 08/04/2022   K 3.7 08/04/2022   CO2 22 08/04/2022   GLUCOSE 297 (H) 08/04/2022   BUN 20 08/04/2022   CREATININE 0.77 08/04/2022   BILITOT 0.5 08/04/2022   ALKPHOS 91 08/04/2022   AST 13 08/04/2022   ALT 16 08/04/2022   PROT 7.0 08/04/2022   ALBUMIN 4.2 08/04/2022   CALCIUM 9.9 08/04/2022   ANIONGAP 12 02/19/2020   EGFR 84 08/04/2022   GFR 94.70 03/02/2020   Lab Results  Component Value Date   CHOL 136 08/04/2022   Lab Results  Component Value Date   HDL 41 08/04/2022   Lab Results  Component Value Date   LDLCALC 67 08/04/2022   Lab Results  Component Value Date   TRIG 166 (H) 08/04/2022    Lab Results  Component Value Date   CHOLHDL 3.3 08/04/2022   Lab Results  Component Value Date   HGBA1C 8.8 (H) 04/05/2023      Assessment & Plan:   Atherine was seen today for medical management of chronic issues and diabetes.  Diagnoses and all orders for this visit:  Type 2 diabetes mellitus with hyperglycemia, with long-term current use of insulin (HCC) A1c 7.4 today, not at goal of <7, but improved from previous at 8.8. Medication changes today: increase basal insulin to 14 units at bedtime. Continue trulicity, metformin 500 mg BID, and humalog 8 units TID with meals. She is on an ACE/ARB and statin. Eye exam: UTD- records requested. Foot exam: today. Urine micro: UTD. Diet and exercise.  -     Bayer DCA Hb A1c Waived -     Microalbumin / creatinine urine ratio -     Vitamin B12  Hyperlipidemia associated with type 2 diabetes mellitus (HCC) LDL at 67. On statin. -     Lipid panel  Hypertension associated with diabetes (HCC) Well controlled on current regimen.  -     CBC with Differential/Platelet -     CMP14+EGFR  Class 1 obesity due to excess calories with serious comorbidity and body mass index (BMI) of 30.0 to 30.9 in adult  Neuropathy Will have her try gabapentin just at bedtime to see if fatigue improves during the day.   Insomnia due to medical condition Well controlled with trazodone.   Postnasal drip -     fluticasone (FLONASE) 50 MCG/ACT nasal spray; Place 2 sprays into both nostrils daily.  Need for immunization against influenza Flu vaccine today.    Follow-up: Return in about 3 months (around 10/06/2023) for CPE.  The patient indicates understanding of these issues and agrees with the plan.    Gabriel Earing, FNP

## 2023-07-07 LAB — LIPID PANEL
Chol/HDL Ratio: 3 {ratio} (ref 0.0–4.4)
Cholesterol, Total: 125 mg/dL (ref 100–199)
HDL: 42 mg/dL (ref >39)
LDL Chol Calc (NIH): 57 mg/dL (ref 0–99)
Triglycerides: 154 mg/dL — ABNORMAL HIGH (ref 0–149)
VLDL Cholesterol Cal: 26 mg/dL (ref 5–40)

## 2023-07-07 LAB — CMP14+EGFR
ALT: 9 [IU]/L (ref 0–32)
AST: 13 [IU]/L (ref 0–40)
Albumin: 4.5 g/dL (ref 3.9–4.9)
Alkaline Phosphatase: 74 [IU]/L (ref 44–121)
BUN/Creatinine Ratio: 23 (ref 12–28)
BUN: 19 mg/dL (ref 8–27)
Bilirubin Total: 0.3 mg/dL (ref 0.0–1.2)
CO2: 24 mmol/L (ref 20–29)
Calcium: 9.5 mg/dL (ref 8.7–10.3)
Chloride: 101 mmol/L (ref 96–106)
Creatinine, Ser: 0.82 mg/dL (ref 0.57–1.00)
Globulin, Total: 2.5 g/dL (ref 1.5–4.5)
Glucose: 103 mg/dL — ABNORMAL HIGH (ref 70–99)
Potassium: 3.9 mmol/L (ref 3.5–5.2)
Sodium: 142 mmol/L (ref 134–144)
Total Protein: 7 g/dL (ref 6.0–8.5)
eGFR: 78 mL/min/{1.73_m2} (ref >59)

## 2023-07-07 LAB — CBC WITH DIFFERENTIAL/PLATELET
Basophils Absolute: 0.1 10*3/uL (ref 0.0–0.2)
Basos: 1 %
EOS (ABSOLUTE): 0.2 10*3/uL (ref 0.0–0.4)
Eos: 3 %
Hematocrit: 43.1 % (ref 34.0–46.6)
Hemoglobin: 14.3 g/dL (ref 11.1–15.9)
Immature Grans (Abs): 0 10*3/uL (ref 0.0–0.1)
Immature Granulocytes: 0 %
Lymphocytes Absolute: 2.5 10*3/uL (ref 0.7–3.1)
Lymphs: 27 %
MCH: 28 pg (ref 26.6–33.0)
MCHC: 33.2 g/dL (ref 31.5–35.7)
MCV: 85 fL (ref 79–97)
Monocytes Absolute: 0.8 10*3/uL (ref 0.1–0.9)
Monocytes: 9 %
Neutrophils Absolute: 5.5 10*3/uL (ref 1.4–7.0)
Neutrophils: 60 %
Platelets: 229 10*3/uL (ref 150–450)
RBC: 5.1 x10E6/uL (ref 3.77–5.28)
RDW: 13.3 % (ref 11.7–15.4)
WBC: 9.2 10*3/uL (ref 3.4–10.8)

## 2023-07-07 LAB — MICROALBUMIN / CREATININE URINE RATIO
Creatinine, Urine: 132.6 mg/dL
Microalb/Creat Ratio: 7 mg/g{creat} (ref 0–29)
Microalbumin, Urine: 9.8 ug/mL

## 2023-07-07 LAB — VITAMIN B12: Vitamin B-12: 474 pg/mL (ref 232–1245)

## 2023-07-24 ENCOUNTER — Other Ambulatory Visit: Payer: Self-pay | Admitting: Family Medicine

## 2023-07-24 DIAGNOSIS — E1169 Type 2 diabetes mellitus with other specified complication: Secondary | ICD-10-CM

## 2023-07-24 DIAGNOSIS — E0821 Diabetes mellitus due to underlying condition with diabetic nephropathy: Secondary | ICD-10-CM

## 2023-07-24 DIAGNOSIS — E1140 Type 2 diabetes mellitus with diabetic neuropathy, unspecified: Secondary | ICD-10-CM

## 2023-08-11 ENCOUNTER — Ambulatory Visit (INDEPENDENT_AMBULATORY_CARE_PROVIDER_SITE_OTHER): Payer: Medicare HMO

## 2023-08-11 VITALS — Ht 66.0 in | Wt 191.0 lb

## 2023-08-11 DIAGNOSIS — Z0000 Encounter for general adult medical examination without abnormal findings: Secondary | ICD-10-CM

## 2023-08-11 NOTE — Progress Notes (Signed)
Subjective:   Rebecca Cook is a 67 y.o. female who presents for Medicare Annual (Subsequent) preventive examination.  Visit Complete: Virtual I connected with  Rebecca Cook on 08/11/23 by a audio enabled telemedicine application and verified that I am speaking with the correct person using two identifiers.  Patient Location: Home  Provider Location: Home Office  I discussed the limitations of evaluation and management by telemedicine. The patient expressed understanding and agreed to proceed.  Vital Signs: Because this visit was a virtual/telehealth visit, some criteria may be missing or patient reported. Any vitals not documented were not able to be obtained and vitals that have been documented are patient reported.  Patient Medicare AWV questionnaire was completed by the patient on 08/11/2023; I have confirmed that all information answered by patient is correct and no changes since this date.  Cardiac Risk Factors include: advanced age (>66men, >49 women);diabetes mellitus;dyslipidemia;hypertension     Objective:    Today's Vitals   08/11/23 0905  Weight: 191 lb (86.6 kg)  Height: 5\' 6"  (1.676 m)   Body mass index is 30.83 kg/m.     08/11/2023    9:15 AM 08/09/2022   10:01 AM 05/27/2021   10:44 AM 02/17/2020    4:10 AM  Advanced Directives  Does Patient Have a Medical Advance Directive? No Yes No No  Type of Special educational needs teacher of [place];Living will    Copy of Healthcare Power of Attorney in Chart?  No - copy requested    Would patient like information on creating a medical advance directive? Yes (MAU/Ambulatory/Procedural Areas - Information given)  Yes (MAU/Ambulatory/Procedural Areas - Information given) No - Patient declined    Current Medications (verified) Outpatient Encounter Medications as of 08/11/2023  Medication Sig   Continuous Glucose Sensor (FREESTYLE LIBRE 3 SENSOR) MISC 1 each by Does not apply route every 14 (fourteen) days.    Dulaglutide (TRULICITY) 4.5 MG/0.5ML SOPN Inject 4.5 mg as directed once a week.   fluticasone (FLONASE) 50 MCG/ACT nasal spray Place 2 sprays into both nostrils daily.   gabapentin (NEURONTIN) 600 MG tablet TAKE 0.5 TABLET (300 MG TOTAL) BY MOUTH 2 (TWO) TIMES DAILY.   Insulin Glargine (BASAGLAR KWIKPEN) 100 UNIT/ML Inject 32 Units into the skin at bedtime. (Patient taking differently: Inject 10 Units into the skin at bedtime.)   insulin lispro (HUMALOG KWIKPEN) 100 UNIT/ML KwikPen Inject 6 Units into the skin 3 (three) times daily. Before each meal, 3x daily. For blood sugar of 140-199: 2 units, 200-250: 4 units, 251-299: 8 units, 300-349: 10 units, for 350 or above: 14 units. (Patient taking differently: Inject 8 Units into the skin 3 (three) times daily. Before each meal, 3x daily. For blood sugar of 140-199: 2 units, 200-250: 4 units, 251-299: 8 units, 300-349: 10 units, for 350 or above: 14 units.)   loratadine (CLARITIN) 10 MG tablet Take 10 mg by mouth daily.   losartan-hydrochlorothiazide (HYZAAR) 100-25 MG tablet TAKE 1 TABLET BY MOUTH EVERY DAY   metFORMIN (GLUCOPHAGE-XR) 500 MG 24 hr tablet Take 2 tablets (1,000 mg total) by mouth 2 (two) times daily with a meal.   Multiple Vitamins-Minerals (MULTIVITAMIN PO) Take 1 tablet by mouth daily.    rosuvastatin (CRESTOR) 10 MG tablet TAKE 1 TABLET BY MOUTH EVERY DAY   traZODone (DESYREL) 50 MG tablet Take 0.5-1 tablets (25-50 mg total) by mouth at bedtime as needed for sleep.   No facility-administered encounter medications on file as of 08/11/2023.  Allergies (verified) Patient has no known allergies.   History: Past Medical History:  Diagnosis Date   Allergy    Many years   Anxiety Recently   Looking into employer's EAP orogram for help   Arthritis This Spring   Right hand. Using a cream for pain relief   Cataract February 2022   Had cataracts removed from both eyes summer of 2022   Depression    Something I've experienced in  my life but not often   Diabetes mellitus, type II (HCC)    Frequent headaches    GERD (gastroesophageal reflux disease)    Hypertension    Kidney stones    None current, last had kidney stones in 1986   Neuromuscular disorder (HCC)    Taking Gabapentin   Past Surgical History:  Procedure Laterality Date   EYE SURGERY  July 2022   Cataract removal in both eyes   TONSILLECTOMY AND ADENOIDECTOMY  09/27/1983   TUBAL LIGATION  09/27/1983   Family History  Problem Relation Age of Onset   Cancer Mother        Lung   Heart disease Father    Hypertension Father    Heart disease Paternal Grandmother        Thyroid   Hypertension Paternal Grandmother    Cancer - Other Brother    Diabetes Maternal Grandfather    Social History   Socioeconomic History   Marital status: Married    Spouse name: Not on file   Number of children: 2   Years of education: College   Highest education level: Associate degree: occupational, Scientist, product/process development, or vocational program  Occupational History    Employer: [employer]  Tobacco Use   Smoking status: Former   Smokeless tobacco: Never   Tobacco comments:    Smoked a few cigarettes a day when i was young. Quit many years ago.  Substance and Sexual Activity   Alcohol use: Yes    Alcohol/week: 0.0 standard drinks of alcohol    Comment: occ   Drug use: No   Sexual activity: Not Currently    Birth control/protection: Post-menopausal, None  Other Topics Concern   Not on file  Social History Narrative   Not on file   Social Determinants of Health   Financial Resource Strain: Low Risk  (08/11/2023)   Overall Financial Resource Strain (CARDIA)    Difficulty of Paying Living Expenses: Not hard at all  Food Insecurity: No Food Insecurity (08/11/2023)   Hunger Vital Sign    Worried About Running Out of Food in the Last Year: Never true    Ran Out of Food in the Last Year: Never true  Transportation Needs: No Transportation Needs (08/11/2023)    PRAPARE - Administrator, Civil Service (Medical): No    Lack of Transportation (Non-Medical): No  Physical Activity: Insufficiently Active (08/11/2023)   Exercise Vital Sign    Days of Exercise per Week: 3 days    Minutes of Exercise per Session: 30 min  Stress: No Stress Concern Present (08/11/2023)   [employer] of Occupational Health - Occupational Stress Questionnaire    Feeling of Stress : Not at all  Social Connections: Moderately Integrated (08/11/2023)   Social Connection and Isolation Panel [NHANES]    Frequency of Communication with Friends and Family: More than three times a week    Frequency of Social Gatherings with Friends and Family: More than three times a week    Attends Religious Services: 1  to 4 times per year    Active Member of Clubs or Organizations: No    Attends Banker Meetings: Never    Marital Status: Married    Tobacco Counseling Counseling given: Not Answered Tobacco comments: Smoked a few cigarettes a day when i was young. Quit many years ago.   Clinical Intake:  Pre-visit preparation completed: Yes  Pain : No/denies pain     Nutritional Risks: None Diabetes: Yes CBG done?: No Did pt. bring in CBG monitor from home?: No  How often do you need to have someone help you when you read instructions, pamphlets, or other written materials from your doctor or pharmacy?: 1 - Never  Interpreter Needed?: No  Information entered by :: Renie Ora, LPN   Activities of Daily Living    08/11/2023    9:15 AM 08/05/2023    4:05 AM  In your present state of health, do you have any difficulty performing the following activities:  Hearing? 0 0  Vision? 0 0  Difficulty concentrating or making decisions? 0 1  Walking or climbing stairs? 0 0  Dressing or bathing? 0 0  Doing errands, shopping? 0 0  Preparing Food and eating ? N N  Using the Toilet? N N  In the past six months, have you accidently leaked urine? N Y  Do  you have problems with loss of bowel control? N N  Managing your Medications? N N  Managing your Finances? N N  Housekeeping or managing your Housekeeping? N N    Patient Care Team: Gabriel Earing, FNP as PCP - General (Family Medicine) Jena Gauss Gerrit Friends, MD as Consulting Physician (Gastroenterology) Danella Maiers, [facility] as Pharmacist (Family Medicine)  Indicate any recent Medical Services you may have received from other than [facility] providers in the past year (date may be approximate).     Assessment:   This is a routine wellness examination for [place].  Hearing/Vision screen Vision Screening - Comments:: Wears rx glasses - up to date with routine eye exams with  [facility]    Goals Addressed             This Visit's Progress    DIET - REDUCE SUGAR INTAKE   On track      Depression Screen    08/11/2023    9:14 AM 07/06/2023    3:45 PM 04/05/2023    2:24 PM 12/22/2022   12:57 PM 08/09/2022    9:59 AM 05/05/2022    8:06 AM 12/29/2021    3:59 PM  PHQ 2/9 Scores  PHQ - 2 Score 0 0 0 0 0 0 0  PHQ- 9 Score 0 0 0 6  0 7    Fall Risk    08/11/2023    9:12 AM 08/05/2023    4:05 AM 07/06/2023    3:45 PM 04/05/2023    2:24 PM 12/22/2022   12:57 PM  Fall Risk   Falls in the past year? 0 0 0 0 0  Number falls in past yr: 0      Injury with Fall? 0      Risk for fall due to : No Fall Risks      Follow up Falls prevention discussed   Falls evaluation completed     MEDICARE RISK AT HOME: Medicare Risk at Home Any stairs in or around the home?: Yes If so, are there any without handrails?: No Home free of loose throw rugs in walkways, pet beds,  electrical cords, etc?: Yes Adequate lighting in your home to reduce risk of falls?: Yes Life alert?: No Use of a cane, walker or w/c?: No Grab bars in the bathroom?: Yes Shower chair or bench in shower?: No Elevated toilet seat or a handicapped toilet?: No  TIMED UP AND GO:  Was the test performed?  No     Cognitive Function:    05/27/2021   10:51 AM  MMSE - Mini Mental State Exam  Orientation to time 5  Orientation to Place 5  Registration 3  Attention/ Calculation 5  Recall 2  Language- name 2 objects 2  Language- repeat 1  Language- follow 3 step command 3  Language- read & follow direction 1  Write a sentence 1  Copy design 1  Total score 29        08/11/2023    9:16 AM 08/09/2022   10:01 AM 05/27/2021   11:34 AM  6CIT Screen  What Year? 0 points 0 points 0 points  What month? 0 points 0 points 0 points  What time? 0 points 0 points 0 points  Count back from 20 0 points 0 points 0 points  Months in reverse 0 points 0 points 0 points  Repeat phrase 0 points 0 points 0 points  Total Score 0 points 0 points 0 points    Immunizations Immunization History  Administered Date(s) Administered   Fluad Quad(high Dose 65+) 08/04/2022   Fluad Trivalent(High Dose 65+) 07/06/2023   Influenza,inj,Quad PF,6+ Mos 10/17/2017, 06/05/2019   Influenza-Unspecified 07/21/2014, 06/17/2021   Moderna Sars-Covid-2 Vaccination 12/09/2019, 12/29/2019, 08/29/2020   Pneumococcal Conjugate-13 03/16/2020   Pneumococcal Polysaccharide-23 05/27/2021   Pneumococcal-Unspecified 03/17/2021   Tdap 03/16/2020   Unspecified SARS-COV-2 Vaccination 05/27/2021   Zoster Recombinant(Shingrix) 05/05/2022, 08/04/2022    TDAP status: Up to date  Flu Vaccine status: Up to date  Pneumococcal vaccine status: Up to date  Covid-19 vaccine status: Completed vaccines  Qualifies for Shingles Vaccine? Yes   Zostavax completed Yes   Shingrix Completed?: Yes  Screening Tests Health Maintenance  Topic Date Due   COVID-19 Vaccine (5 - 2023-24 season) 05/28/2023   HEMOGLOBIN A1C  01/04/2024   OPHTHALMOLOGY EXAM  03/09/2024   MAMMOGRAM  05/10/2024   Diabetic kidney evaluation - eGFR measurement  07/05/2024   Diabetic kidney evaluation - Urine ACR  07/05/2024   FOOT EXAM  07/05/2024   Medicare Annual  Wellness (AWV)  08/10/2024   Fecal DNA (Cologuard)  01/16/2025   DTaP/Tdap/Td (2 - Td or Tdap) 03/16/2030   Pneumonia Vaccine 68+ Years old  Completed   INFLUENZA VACCINE  Completed   DEXA SCAN  Completed   Hepatitis C Screening  Completed   Zoster Vaccines- Shingrix  Completed   HPV VACCINES  Aged Out   Colonoscopy  Discontinued    Health Maintenance  Health Maintenance Due  Topic Date Due   COVID-19 Vaccine (5 - 2023-24 season) 05/28/2023    Colorectal cancer screening: Type of screening: Cologuard. Completed 01/16/2022. Repeat every 3 years  Mammogram status: Completed 05/11/2023. Repeat every year  Bone Density status: Completed 05/15/2023. Results reflect: Bone density results: OSTEOPENIA. Repeat every 5 years.  Lung Cancer Screening: (Low Dose CT Chest recommended if Age 69-80 years, 20 pack-year currently smoking OR have quit w/in 15years.) does not qualify.   Lung Cancer Screening Referral: n/a  Additional Screening:  Hepatitis C Screening: does not qualify; Completed 01/06/2021  Vision Screening: Recommended annual ophthalmology exams for early detection of glaucoma and other  disorders of the eye. Is the patient up to date with their annual eye exam?  Yes  Who is the provider or what is the name of the office in which the patient attends annual eye exams? [facility]  If pt is not established with a provider, would they like to be referred to a provider to establish care? No .   Dental Screening: Recommended annual dental exams for proper oral hygiene  Diabetic Foot Exam: Diabetic Foot Exam: Overdue, Pt has been advised about the importance in completing this exam. Pt is scheduled for diabetic foot exam on next office visit .  Community Resource Referral / Chronic Care Management: CRR required this visit?  No   CCM required this visit?  No     Plan:     I have personally reviewed and noted the following in the patient's chart:   Medical  and social history Use of alcohol, tobacco or illicit drugs  Current medications and supplements including opioid prescriptions. Patient is not currently taking opioid prescriptions. Functional ability and status Nutritional status Physical activity Advanced directives List of other physicians Hospitalizations, surgeries, and ER visits in previous 12 months Vitals Screenings to include cognitive, depression, and falls Referrals and appointments  In addition, I have reviewed and discussed with patient certain preventive protocols, quality metrics, and best practice recommendations. A written personalized care plan for preventive services as well as general preventive health recommendations were provided to patient.     Lorrene Reid, LPN   [id number]   After Visit Summary: (MyChart) Due to this being a telephonic visit, the after visit summary with patients personalized plan was offered to patient via MyChart   Nurse Notes: none

## 2023-08-11 NOTE — Patient Instructions (Signed)
Rebecca Cook , Thank you for taking time to come for your Medicare Wellness Visit. I appreciate your ongoing commitment to your health goals. Please review the following plan we discussed and let me know if I can assist you in the future.   Referrals/Orders/Follow-Ups/Clinician Recommendations: Aim for 30 minutes of exercise or brisk walking, 6-8 glasses of water, and 5 servings of fruits and vegetables each day.   This is a list of the screening recommended for you and due dates:  Health Maintenance  Topic Date Due   COVID-19 Vaccine (5 - 2023-24 season) 05/28/2023   Hemoglobin A1C  01/04/2024   Eye exam for diabetics  03/09/2024   Mammogram  05/10/2024   Yearly kidney function blood test for diabetes  07/05/2024   Yearly kidney health urinalysis for diabetes  07/05/2024   Complete foot exam   07/05/2024   Medicare Annual Wellness Visit  08/10/2024   Cologuard (Stool DNA test)  01/16/2025   DTaP/Tdap/Td vaccine (2 - Td or Tdap) 03/16/2030   Pneumonia Vaccine  Completed   Flu Shot  Completed   DEXA scan (bone density measurement)  Completed   Hepatitis C Screening  Completed   Zoster (Shingles) Vaccine  Completed   HPV Vaccine  Aged Out   Colon Cancer Screening  Discontinued    Advanced directives: (Provided) Advance directive discussed with you today. I have provided a copy for you to complete at home and have notarized. Once this is complete, please bring a copy in to our office so we can scan it into your chart. Information on Advanced Care Planning can be found at [facility] Advance Health Care Directives Advance Health Care Directives ([email or web address]    Next Medicare Annual Wellness Visit scheduled for next year: Yes  Insert Preventive Care attachment Insert FALL PREVENTION attachment if needed

## 2023-08-14 ENCOUNTER — Encounter: Payer: Self-pay | Admitting: Family Medicine

## 2023-08-14 ENCOUNTER — Telehealth: Payer: Self-pay | Admitting: Family Medicine

## 2023-08-14 NOTE — Telephone Encounter (Signed)
Sent request for eye exam  Copied from CRM [id number]. Topic: General - Other >> Aug 11, 2023  1:25 PM Rebecca Cook wrote: Reason for CRM: PT wants her doctor to know and staff to update her chart that her last eye appointment was 02/09/23 at [facility] care center [facility] [address] in [place] with DR. Perital Bufford Buttner

## 2023-09-05 ENCOUNTER — Other Ambulatory Visit: Payer: Self-pay | Admitting: Family Medicine

## 2023-09-05 DIAGNOSIS — E1140 Type 2 diabetes mellitus with diabetic neuropathy, unspecified: Secondary | ICD-10-CM

## 2023-09-08 ENCOUNTER — Other Ambulatory Visit: Payer: Self-pay | Admitting: Family Medicine

## 2023-09-15 NOTE — Telephone Encounter (Signed)
 Results abstracted and Care Team updated

## 2023-09-28 ENCOUNTER — Telehealth: Payer: Self-pay

## 2023-09-28 NOTE — Progress Notes (Signed)
 Pharmacy Medication Assistance Program Note    11/16/2023  Patient ID: Rebecca Cook, female   DOB: November 01, 1955, 68 y.o.   MRN: [id number]     09/28/2023  Outreach Medication One  Manufacturer Medication One [employer] Basaglar   Type of Assistance Manufacturer Assistance  Date Application Sent to Prescriber 09/28/2023  Date Application Received From Patient 09/28/2023  Date Application Submitted to Manufacturer 10/09/2023  Method Application Sent to Manufacturer Fax  Patient Assistance Determination Approved  Approval End Date 09/25/2024         09/28/2023  Outreach Medication Two  Manufacturer Medication Two Retail [employer] Humalog   Type of Radiographer, Therapeutic Assistance  Date Application Sent to Prescriber 09/28/2023  Date Application Received From Patient 09/28/2023  Method Application Sent to Manufacturer Fax  Date Application Submitted to Manufacturer 10/09/2023  Patient Assistance Determination Approved         09/28/2023  Outreach Medication Three  Manufacturer Medication Three Lilly  [employer] Trulicity   Dose of Trulicity  4.5MG   Type of Radiographer, Therapeutic Assistance  Date Application Sent to Prescriber 09/28/2023  Date Application Received From Patient 09/28/2023  Date Application Submitted to Manufacturer 10/09/2023  Method Application Sent to Manufacturer Fax  Patient Assistance Determination Approved  Approval Start Date 10/10/2023  Approval End Date 09/25/2024

## 2023-10-12 ENCOUNTER — Other Ambulatory Visit: Payer: Self-pay | Admitting: Family Medicine

## 2023-10-12 ENCOUNTER — Encounter: Payer: Self-pay | Admitting: Family Medicine

## 2023-10-12 ENCOUNTER — Ambulatory Visit: Payer: Medicare HMO | Admitting: Family Medicine

## 2023-10-12 ENCOUNTER — Ambulatory Visit (INDEPENDENT_AMBULATORY_CARE_PROVIDER_SITE_OTHER): Payer: Medicare HMO

## 2023-10-12 VITALS — BP 121/78 | HR 86 | Temp 97.5°F | Ht 66.0 in | Wt 192.0 lb

## 2023-10-12 DIAGNOSIS — I152 Hypertension secondary to endocrine disorders: Secondary | ICD-10-CM | POA: Diagnosis not present

## 2023-10-12 DIAGNOSIS — Z0001 Encounter for general adult medical examination with abnormal findings: Secondary | ICD-10-CM

## 2023-10-12 DIAGNOSIS — E1159 Type 2 diabetes mellitus with other circulatory complications: Secondary | ICD-10-CM | POA: Diagnosis not present

## 2023-10-12 DIAGNOSIS — E785 Hyperlipidemia, unspecified: Secondary | ICD-10-CM | POA: Diagnosis not present

## 2023-10-12 DIAGNOSIS — M25571 Pain in right ankle and joints of right foot: Secondary | ICD-10-CM | POA: Diagnosis not present

## 2023-10-12 DIAGNOSIS — E1140 Type 2 diabetes mellitus with diabetic neuropathy, unspecified: Secondary | ICD-10-CM | POA: Diagnosis not present

## 2023-10-12 DIAGNOSIS — M79605 Pain in left leg: Secondary | ICD-10-CM

## 2023-10-12 DIAGNOSIS — Z6830 Body mass index (BMI) 30.0-30.9, adult: Secondary | ICD-10-CM | POA: Diagnosis not present

## 2023-10-12 DIAGNOSIS — Z794 Long term (current) use of insulin: Secondary | ICD-10-CM | POA: Diagnosis not present

## 2023-10-12 DIAGNOSIS — G4701 Insomnia due to medical condition: Secondary | ICD-10-CM

## 2023-10-12 DIAGNOSIS — E66811 Obesity, class 1: Secondary | ICD-10-CM | POA: Diagnosis not present

## 2023-10-12 DIAGNOSIS — Z0000 Encounter for general adult medical examination without abnormal findings: Secondary | ICD-10-CM

## 2023-10-12 DIAGNOSIS — E1169 Type 2 diabetes mellitus with other specified complication: Secondary | ICD-10-CM

## 2023-10-12 DIAGNOSIS — M4316 Spondylolisthesis, lumbar region: Secondary | ICD-10-CM | POA: Diagnosis not present

## 2023-10-12 DIAGNOSIS — E1165 Type 2 diabetes mellitus with hyperglycemia: Secondary | ICD-10-CM | POA: Diagnosis not present

## 2023-10-12 DIAGNOSIS — Z1283 Encounter for screening for malignant neoplasm of skin: Secondary | ICD-10-CM

## 2023-10-12 DIAGNOSIS — M419 Scoliosis, unspecified: Secondary | ICD-10-CM | POA: Diagnosis not present

## 2023-10-12 DIAGNOSIS — M4726 Other spondylosis with radiculopathy, lumbar region: Secondary | ICD-10-CM | POA: Diagnosis not present

## 2023-10-12 LAB — BAYER DCA HB A1C WAIVED: HB A1C (BAYER DCA - WAIVED): 6.7 % — ABNORMAL HIGH (ref 4.8–5.6)

## 2023-10-12 MED ORDER — METFORMIN HCL ER 500 MG PO TB24: ORAL_TABLET | ORAL | 3 refills | Status: AC

## 2023-10-12 MED ORDER — TRULICITY 4.5 MG/0.5ML ~~LOC~~ SOAJ: 4.5000 mg | SUBCUTANEOUS | 5 refills | Status: DC

## 2023-10-12 MED ORDER — INSULIN LISPRO (1 UNIT DIAL) 100 UNIT/ML (KWIKPEN): 8.0000 [IU] | PEN_INJECTOR | Freq: Three times a day (TID) | SUBCUTANEOUS | 3 refills | Status: DC

## 2023-10-12 MED ORDER — GABAPENTIN 600 MG PO TABS: 300.0000 mg | ORAL_TABLET | Freq: Three times a day (TID) | ORAL | 3 refills | Status: AC

## 2023-10-12 MED ORDER — ROSUVASTATIN CALCIUM 10 MG PO TABS: 10.0000 mg | ORAL_TABLET | Freq: Every day | ORAL | 3 refills | Status: DC

## 2023-10-12 MED ORDER — LOSARTAN POTASSIUM-HCTZ 100-25 MG PO TABS: 1.0000 | ORAL_TABLET | Freq: Every day | ORAL | 3 refills | Status: DC

## 2023-10-12 NOTE — Telephone Encounter (Signed)
ADMELOG SOLOSTAR 100 UNIT/ML KwikPen   Pharmacy comment: Alternative Requested:THE PRESCRIBED MEDICATION IS NOT COVERED BY INSURANCE. PLEASE CONSIDER CHANGING TO ONE OF THE SUGGESTED COVERED ALTERNATIVES.   All Pharmacy Suggested Alternatives:  insulin lispro (ADMELOG SOLOSTAR) 100 UNIT/ML KwikPen insulin lispro (ADMELOG) 100 UNIT/ML injection insulin aspart (FIASP FLEXTOUCH) 100 UNIT/ML FlexTouch Pen Insulin Aspart, w/Niacinamide, (FIASP) 100 UNIT/ML SOLN Insulin Aspart, w/Niacinamide, (FIASP PENFILL) 100 UNIT/ML SOCT

## 2023-10-12 NOTE — Assessment & Plan Note (Signed)
 Well-controlled with trazodone

## 2023-10-12 NOTE — Telephone Encounter (Signed)
ADMELOG 100 UNIT/ML injection        Changed from: insulin lispro (HUMALOG KWIKPEN) 100 UNIT/ML KwikPen   Pharmacy comment: Alternative Requested:NEEDS NOVO INSULIN FOR FORMULARY COVERAGE.   All Pharmacy Suggested Alternatives:  insulin lispro (ADMELOG) 100 UNIT/ML injection insulin lispro (ADMELOG SOLOSTAR) 100 UNIT/ML KwikPen insulin aspart (FIASP FLEXTOUCH) 100 UNIT/ML FlexTouch Pen Insulin Aspart, w/Niacinamide, (FIASP) 100 UNIT/ML SOLN Insulin Aspart, w/Niacinamide, (FIASP PENFILL) 100 UNIT/ML SOCT

## 2023-10-12 NOTE — Telephone Encounter (Signed)
  ADMELOG SOLOSTAR 100 UNIT/ML KwikPen        Changed from: insulin lispro (HUMALOG KWIKPEN) 100 UNIT/ML KwikPen     Pharmacy comment: Alternative Requested:THE PRESCRIBED MEDICATION IS NOT COVERED BY INSURANCE. PLEASE CONSIDER CHANGING TO ONE OF THE SUGGESTED COVERED ALTERNATIVES.   All Pharmacy Suggested Alternatives:  insulin lispro (ADMELOG SOLOSTAR) 100 UNIT/ML KwikPen insulin lispro (ADMELOG) 100 UNIT/ML injection insulin aspart (FIASP FLEXTOUCH) 100 UNIT/ML FlexTouch Pen Insulin Aspart, w/Niacinamide, (FIASP) 100 UNIT/ML SOLN Insulin Aspart, w/Niacinamide, (FIASP PENFILL) 100 UNIT/ML SOCT

## 2023-10-12 NOTE — Progress Notes (Signed)
Complete physical exam  Patient: Rebecca Cook   DOB: 02-20-1956   68 y.o. Female  MRN: [id number]  Subjective:    Chief Complaint  Patient presents with   Annual Exam    Rebecca Cook is a 68 y.o. female who presents today for a complete physical exam. She reports consuming a  well balanced  diet. The patient does not participate in regular exercise at present. She has tried to stay active throughout the day. She has started working full time again. She generally feels well. She reports sleeping well. She does not have additional problems to discuss today.   Burning, tingling pain on lateral side of her left thigh for a few months. Can be intense at times. Leg feels cold at times. Aggravating by standing. No back pain, changes in bowel or bladder control, saddle anesthesia. No back pain. Takes gabapentin BID for hx of neuropathy.   Also having achy pain in back of right heel. Wears supportive shoes.    Would like referral to dermatology for screening. Hx of UV exposure, sun burns.   Most recent fall risk assessment:    10/12/2023    8:06 AM  Fall Risk   Falls in the past year? 0     Most recent depression screenings:    08/11/2023    9:14 AM 07/06/2023    3:45 PM  PHQ 2/9 Scores  PHQ - 2 Score 0 0  PHQ- 9 Score 0 0    Vision:Within last year and Dental: No current dental problems and Receives regular dental care  Past Medical History:  Diagnosis Date   Allergy    Many years   Anxiety Recently   Looking into employer's EAP orogram for help   Arthritis This Spring   Right hand. Using a cream for pain relief   Cataract February 2022   Had cataracts removed from both eyes summer of 2022   Depression    Something I've experienced in my life but not often   Diabetes mellitus, type II (HCC)    Frequent headaches    GERD (gastroesophageal reflux disease)    Hypertension    Kidney stones    None current, last had kidney stones in 1986   Neuromuscular disorder (HCC)     Taking Gabapentin      Patient Care Team: Gabriel Earing, FNP as PCP - General (Family Medicine) Jena Gauss, Gerrit Friends, MD as Consulting Physician (Gastroenterology) Danella Maiers, [facility] as Pharmacist (Family Medicine) [facility]   Outpatient Medications Prior to Visit  Medication Sig   Continuous Glucose Sensor (FREESTYLE LIBRE 3 SENSOR) MISC 1 each by Does not apply route every 14 (fourteen) days.   Dulaglutide (TRULICITY) 4.5 MG/0.5ML SOPN Inject 4.5 mg as directed once a week.   fluticasone (FLONASE) 50 MCG/ACT nasal spray Place 2 sprays into both nostrils daily.   gabapentin (NEURONTIN) 600 MG tablet TAKE 0.5 TABLET (300 MG TOTAL) BY MOUTH 2 (TWO) TIMES DAILY.   Insulin Glargine (BASAGLAR KWIKPEN) 100 UNIT/ML Inject 32 Units into the skin at bedtime. (Patient taking differently: Inject 10 Units into the skin at bedtime.)   insulin lispro (HUMALOG KWIKPEN) 100 UNIT/ML KwikPen Inject 6 Units into the skin 3 (three) times daily. Before each meal, 3x daily. For blood sugar of 140-199: 2 units, 200-250: 4 units, 251-299: 8 units, 300-349: 10 units, for 350 or above: 14 units. (Patient taking differently: Inject 8 Units into the skin 3 (three) times daily. Before each  meal, 3x daily. For blood sugar of 140-199: 2 units, 200-250: 4 units, 251-299: 8 units, 300-349: 10 units, for 350 or above: 14 units.)   loratadine (CLARITIN) 10 MG tablet Take 10 mg by mouth daily.   losartan-hydrochlorothiazide (HYZAAR) 100-25 MG tablet TAKE 1 TABLET BY MOUTH EVERY DAY   metFORMIN (GLUCOPHAGE-XR) 500 MG 24 hr tablet TAKE 2 TABLETS BY MOUTH TWICE A DAY WITH MEALS   Multiple Vitamins-Minerals (MULTIVITAMIN PO) Take 1 tablet by mouth daily.    rosuvastatin (CRESTOR) 10 MG tablet TAKE 1 TABLET BY MOUTH EVERY DAY   traZODone (DESYREL) 50 MG tablet Take 0.5-1 tablets (25-50 mg total) by mouth at bedtime as needed for sleep.   No facility-administered medications prior to visit.    ROS Negative unless  specially indicated above in HPI.       Objective:     BP 121/78   Pulse 86   Temp (!) 97.5 F (36.4 C) (Temporal)   Ht 5\' 6"  (1.676 m)   Wt 192 lb (87.1 kg)   SpO2 98%   BMI 30.99 kg/m    Physical Exam Vitals and nursing note reviewed.  Constitutional:      General: She is not in acute distress.    Appearance: Normal appearance. She is not ill-appearing.  HENT:     Head: Normocephalic.     Right Ear: Tympanic membrane, ear canal and external ear normal.     Left Ear: Tympanic membrane, ear canal and external ear normal.     Nose: Nose normal.     Mouth/Throat:     Mouth: Mucous membranes are dry.     Pharynx: Oropharynx is clear.  Eyes:     Extraocular Movements: Extraocular movements intact.     Conjunctiva/sclera: Conjunctivae normal.     Pupils: Pupils are equal, round, and reactive to light.  Cardiovascular:     Rate and Rhythm: Normal rate and regular rhythm.     Pulses: Normal pulses.     Heart sounds: Normal heart sounds. No murmur heard.    No friction rub. No gallop.  Pulmonary:     Effort: Pulmonary effort is normal.     Breath sounds: Normal breath sounds.  Abdominal:     General: Bowel sounds are normal. There is no distension.     Palpations: Abdomen is soft. There is no mass.     Tenderness: There is no abdominal tenderness. There is no guarding.  Musculoskeletal:     Cervical back: Normal range of motion and neck supple. No tenderness.     Lumbar back: Normal.     Left upper leg: No swelling, edema, deformity, lacerations, tenderness or bony tenderness.     Right lower leg: No edema.     Left lower leg: No edema.     Right ankle: No swelling or ecchymosis. No tenderness. Normal range of motion.     Right Achilles Tendon: Tenderness present. No defects. Thompson's test negative.  Skin:    General: Skin is warm and dry.     Capillary Refill: Capillary refill takes less than 2 seconds.     Findings: No lesion or rash.  Neurological:      General: No focal deficit present.     Mental Status: She is alert and oriented to person, place, and time.     Cranial Nerves: No cranial nerve deficit.     Motor: No weakness.     Gait: Gait normal.  Psychiatric:  Mood and Affect: Mood normal.        Behavior: Behavior normal.        Thought Content: Thought content normal.        Judgment: Judgment normal.      No results found for any visits on 10/12/23.     Assessment & Plan:    Routine Health Maintenance and Physical Exam  Routine general medical examination at a health care facility  Type 2 diabetes mellitus with diabetic neuropathy, with long-term current use of insulin (HCC) Assessment & Plan: A1c 6.7 today, at goal of <7. Medication changes today: continue current regimen. She is on ACE/ARB and statin. Diet and exercise. Will increase gabapentin to TID to help with neuropathy.    Orders: -     Bayer DCA Hb A1c Waived -     Trulicity; Inject 4.5 mg as directed once a week.  Dispense: 6 mL; Refill: 5 -     Gabapentin; Take 0.5 tablets (300 mg total) by mouth 3 (three) times daily. TAKE 0.5 TABLET (300 MG TOTAL) BY MOUTH 3 TIMES DAILY.  Dispense: 135 tablet; Refill: 3 -     Insulin Lispro (1 Unit Dial); Inject 8 Units into the skin 3 (three) times daily. Before each meal, 3x daily. For blood sugar of 140-199: 2 units, 200-250: 4 units, 251-299: 8 units, 300-349: 10 units, for 350 or above: 14 units.  Dispense: 45 mL; Refill: 3 -     Losartan Potassium-HCTZ; Take 1 tablet by mouth daily.  Dispense: 90 tablet; Refill: 3 -     metFORMIN HCl ER; TAKE 2 TABLETS BY MOUTH TWICE A DAY WITH MEALS  Dispense: 360 tablet; Refill: 3 -     Rosuvastatin Calcium; Take 1 tablet (10 mg total) by mouth daily.  Dispense: 90 tablet; Refill: 3  Hypertension associated with diabetes (HCC) Assessment & Plan: Well controlled with losartan-hydrochlorothiazide.   Orders: -     TSH  Hyperlipidemia associated with type 2 diabetes mellitus  (HCC) Assessment & Plan: On statin. Diet, exercise, weight loss.   Orders: -     Rosuvastatin Calcium; Take 1 tablet (10 mg total) by mouth daily.  Dispense: 90 tablet; Refill: 3  Class 1 obesity due to excess calories with serious comorbidity and body mass index (BMI) of 30.0 to 30.9 in adult Assessment & Plan: Diet, exercise, weight loss.    Insomnia due to medical condition Assessment & Plan: Well controlled with trazodone.    Skin cancer screening -     Ambulatory referral to Dermatology  Left leg pain ? Lumbar radiculopathy vs neuropathy. Xray today in office. Report pending. Increase gabapentin to TID.  -     DG Lumbar Spine 2-3 Views; Future  Acute right ankle pain Discussed supportive shoes, ankle brace. Return to office for new or worsening symptoms, or if symptoms persist.     Immunization History  Administered Date(s) Administered   Fluad Quad(high Dose 65+) 08/04/2022   Fluad Trivalent(High Dose 65+) 07/06/2023   Influenza,inj,Quad PF,6+ Mos 10/17/2017, 06/05/2019   Influenza-Unspecified 07/21/2014, 06/17/2021   Moderna Sars-Covid-2 Vaccination 12/09/2019, 12/29/2019, 08/29/2020   Pneumococcal Conjugate-13 03/16/2020   Pneumococcal Polysaccharide-23 05/27/2021   Pneumococcal-Unspecified 03/17/2021   Tdap 03/16/2020   Unspecified SARS-COV-2 Vaccination 05/27/2021   Zoster Recombinant(Shingrix) 05/05/2022, 08/04/2022    Health Maintenance  Topic Date Due   COVID-19 Vaccine (5 - 2024-25 season) 10/27/2024 (Originally 05/28/2023)   HEMOGLOBIN A1C  01/04/2024   OPHTHALMOLOGY EXAM  02/09/2024   MAMMOGRAM  05/10/2024   Diabetic kidney evaluation - eGFR measurement  07/05/2024   Diabetic kidney evaluation - Urine ACR  07/05/2024   FOOT EXAM  07/05/2024   Medicare Annual Wellness (AWV)  08/10/2024   Fecal DNA (Cologuard)  01/16/2025   DEXA SCAN  05/14/2025   DTaP/Tdap/Td (2 - Td or Tdap) 03/16/2030   Pneumonia Vaccine 59+ Years old  Completed   INFLUENZA  VACCINE  Completed   Hepatitis C Screening  Completed   Zoster Vaccines- Shingrix  Completed   HPV VACCINES  Aged Out   Colonoscopy  Discontinued    Discussed health benefits of physical activity, and encouraged her to engage in regular exercise appropriate for her age and condition.  Problem List Items Addressed This Visit       Cardiovascular and Mediastinum   Hypertension associated with diabetes (HCC)   Well controlled with losartan-hydrochlorothiazide.       Relevant Medications   Dulaglutide (TRULICITY) 4.5 MG/0.5ML SOAJ   insulin lispro (HUMALOG KWIKPEN) 100 UNIT/ML KwikPen   losartan-hydrochlorothiazide (HYZAAR) 100-25 MG tablet   metFORMIN (GLUCOPHAGE-XR) 500 MG 24 hr tablet   rosuvastatin (CRESTOR) 10 MG tablet   Other Relevant Orders   TSH     Endocrine   Hyperlipidemia associated with type 2 diabetes mellitus (HCC)   On statin. Diet, exercise, weight loss.       Relevant Medications   Dulaglutide (TRULICITY) 4.5 MG/0.5ML SOAJ   insulin lispro (HUMALOG KWIKPEN) 100 UNIT/ML KwikPen   losartan-hydrochlorothiazide (HYZAAR) 100-25 MG tablet   metFORMIN (GLUCOPHAGE-XR) 500 MG 24 hr tablet   rosuvastatin (CRESTOR) 10 MG tablet   Type 2 diabetes mellitus with diabetic neuropathy, with long-term current use of insulin (HCC)   A1c 6.7 today, at goal of <7. Medication changes today: continue current regimen. She is on ACE/ARB and statin. Diet and exercise. Will increase gabapentin to TID to help with neuropathy.        Relevant Medications   Dulaglutide (TRULICITY) 4.5 MG/0.5ML SOAJ   gabapentin (NEURONTIN) 600 MG tablet   insulin lispro (HUMALOG KWIKPEN) 100 UNIT/ML KwikPen   losartan-hydrochlorothiazide (HYZAAR) 100-25 MG tablet   metFORMIN (GLUCOPHAGE-XR) 500 MG 24 hr tablet   rosuvastatin (CRESTOR) 10 MG tablet   Other Relevant Orders   Bayer DCA Hb A1c Waived     Other   Class 1 obesity due to excess calories with serious comorbidity and body mass index  (BMI) of 30.0 to 30.9 in adult   Diet, exercise, weight loss.       Relevant Medications   Dulaglutide (TRULICITY) 4.5 MG/0.5ML SOAJ   insulin lispro (HUMALOG KWIKPEN) 100 UNIT/ML KwikPen   metFORMIN (GLUCOPHAGE-XR) 500 MG 24 hr tablet   Insomnia due to medical condition   Well controlled with trazodone.       Other Visit Diagnoses       Routine general medical examination at a health care facility    -  Primary     Skin cancer screening       Relevant Orders   Ambulatory referral to Dermatology     Left leg pain       Relevant Orders   DG Lumbar Spine 2-3 Views     Acute right ankle pain          Return in about 3 months (around 01/10/2024) for chronic follow up.  The patient indicates understanding of these issues and agrees with the plan.   Gabriel Earing, FNP

## 2023-10-12 NOTE — Assessment & Plan Note (Addendum)
A1c 6.7 today, at goal of <7. Medication changes today: continue current regimen. She is on ACE/ARB and statin. Diet and exercise. Will increase gabapentin to TID to help with neuropathy.

## 2023-10-12 NOTE — Patient Instructions (Signed)

## 2023-10-12 NOTE — Assessment & Plan Note (Signed)
Diet, exercise, weight loss

## 2023-10-12 NOTE — Assessment & Plan Note (Signed)
Well controlled with losartan-hydrochlorothiazide.

## 2023-10-12 NOTE — Assessment & Plan Note (Signed)
 On statin. Diet, exercise, weight loss.

## 2023-10-13 LAB — TSH: TSH: 2.14 u[IU]/mL (ref 0.450–4.500)

## 2023-10-19 ENCOUNTER — Telehealth: Payer: Self-pay

## 2023-10-19 NOTE — Telephone Encounter (Signed)
Copied from CRM [id number]. Topic: Clinical - Prescription Issue >> Oct 19, 2023 10:14 AM Joanette Gula wrote: Upon speaking with Monique w/ [employer], insulin lispro (HUMALOG KWIKPEN) 100 UNIT/ML KwikPen isn't covered and she would like to make a suggestion of Novolog or Adelog...  if neeed call [phone].

## 2023-10-19 NOTE — Telephone Encounter (Signed)
Rebecca Cook is getting insulin through patient assistance correct? Do we need to make any changes?

## 2023-10-27 ENCOUNTER — Telehealth: Payer: Self-pay | Admitting: Family Medicine

## 2023-10-27 DIAGNOSIS — E1140 Type 2 diabetes mellitus with diabetic neuropathy, unspecified: Secondary | ICD-10-CM

## 2023-10-27 DIAGNOSIS — E0821 Diabetes mellitus due to underlying condition with diabetic nephropathy: Secondary | ICD-10-CM

## 2023-10-27 NOTE — Telephone Encounter (Unsigned)
Copied from CRM [id number]. Topic: Clinical - Prescription Issue >> Oct 27, 2023 12:56 PM Victorino Dike T wrote: Reason for CRM: insulin lispro (HUMALOG KWIKPEN) 100 UNIT/ML KwikPen and Insulin Glargine (BASAGLAR KWIKPEN) 100 UNIT/ML  need a digital prescription sent to [facility], any question call patient [phone]

## 2023-11-02 MED ORDER — TRULICITY 4.5 MG/0.5ML ~~LOC~~ SOAJ: 4.5000 mg | SUBCUTANEOUS | 5 refills | Status: DC

## 2023-11-02 MED ORDER — BASAGLAR KWIKPEN 100 UNIT/ML ~~LOC~~ SOPN: 32.0000 [IU] | PEN_INJECTOR | Freq: Every day | SUBCUTANEOUS | 3 refills | Status: AC

## 2023-11-02 MED ORDER — INSULIN LISPRO (1 UNIT DIAL) 100 UNIT/ML (KWIKPEN): 8.0000 [IU] | PEN_INJECTOR | Freq: Three times a day (TID) | SUBCUTANEOUS | 3 refills | Status: DC

## 2023-11-02 NOTE — Addendum Note (Signed)
 Addended by: Eliasar Hlavaty D on: 11/02/2023 08:21 AM   Modules accepted: Orders

## 2023-11-02 NOTE — Telephone Encounter (Signed)
 Escribed [facility] meds to [facility]  Trulicity , humalog  and basaglar 

## 2023-11-17 ENCOUNTER — Telehealth: Payer: Self-pay | Admitting: Family Medicine

## 2023-11-17 DIAGNOSIS — E1140 Type 2 diabetes mellitus with diabetic neuropathy, unspecified: Secondary | ICD-10-CM

## 2023-11-17 NOTE — Telephone Encounter (Signed)
 Routing to provider for clarification  Copied from CRM [id number]. Topic: Clinical - Prescription Issue >> Nov 17, 2023  5:32 PM Thomes Dinning wrote: Reason for CRM: Debria from the pharmacy [phone] needs clarification for the patients following medication: insulin lispro (HUMALOG KWIKPEN) 100 UNIT/ML KwikPen. She is asking what would be the daily max for the following : For blood sugar of 140-199: 2 units, 200-250: 4 units, 251-299: 8 units, 300-349: 10 units, for 350 or above: 14 units.

## 2023-11-20 NOTE — Telephone Encounter (Signed)
 I tried calling the pharmacy back but was on hold for over 10 minutes so left message.

## 2023-11-20 NOTE — Telephone Encounter (Signed)
 Daily max is 42 units total

## 2023-12-07 MED ORDER — INSULIN LISPRO (1 UNIT DIAL) 100 UNIT/ML (KWIKPEN)
8.0000 [IU] | PEN_INJECTOR | Freq: Three times a day (TID) | SUBCUTANEOUS | 3 refills | Status: DC
Start: 2023-12-07 — End: 2023-12-07

## 2023-12-07 MED ORDER — INSULIN LISPRO (1 UNIT DIAL) 100 UNIT/ML (KWIKPEN): 8.0000 [IU] | PEN_INJECTOR | Freq: Three times a day (TID) | SUBCUTANEOUS | 3 refills | Status: AC

## 2023-12-07 NOTE — Addendum Note (Signed)
 Addended by: Julious Payer D on: 12/07/2023 02:08 PM   Modules accepted: Orders

## 2023-12-07 NOTE — Telephone Encounter (Signed)
 Script was sent to incorrect pharmacy, corrected and sent to [facility] pharmacy

## 2023-12-07 NOTE — Telephone Encounter (Signed)
 Updated script with daily max dose and recent to pharmacy

## 2023-12-07 NOTE — Addendum Note (Signed)
 Addended by: Julious Payer D on: 12/07/2023 02:48 PM   Modules accepted: Orders

## 2024-01-10 ENCOUNTER — Ambulatory Visit: Payer: Medicare HMO | Admitting: Family Medicine

## 2024-01-10 ENCOUNTER — Encounter: Payer: Self-pay | Admitting: Family Medicine

## 2024-01-10 VITALS — BP 125/88 | HR 73 | Temp 97.6°F | Ht 66.0 in | Wt 196.0 lb

## 2024-01-10 DIAGNOSIS — E66811 Obesity, class 1: Secondary | ICD-10-CM

## 2024-01-10 DIAGNOSIS — Z6830 Body mass index (BMI) 30.0-30.9, adult: Secondary | ICD-10-CM | POA: Diagnosis not present

## 2024-01-10 DIAGNOSIS — E1169 Type 2 diabetes mellitus with other specified complication: Secondary | ICD-10-CM | POA: Diagnosis not present

## 2024-01-10 DIAGNOSIS — I152 Hypertension secondary to endocrine disorders: Secondary | ICD-10-CM | POA: Diagnosis not present

## 2024-01-10 DIAGNOSIS — E1159 Type 2 diabetes mellitus with other circulatory complications: Secondary | ICD-10-CM | POA: Diagnosis not present

## 2024-01-10 DIAGNOSIS — Z794 Long term (current) use of insulin: Secondary | ICD-10-CM | POA: Diagnosis not present

## 2024-01-10 DIAGNOSIS — E6609 Other obesity due to excess calories: Secondary | ICD-10-CM | POA: Diagnosis not present

## 2024-01-10 DIAGNOSIS — E1140 Type 2 diabetes mellitus with diabetic neuropathy, unspecified: Secondary | ICD-10-CM | POA: Diagnosis not present

## 2024-01-10 DIAGNOSIS — G4701 Insomnia due to medical condition: Secondary | ICD-10-CM

## 2024-01-10 DIAGNOSIS — E785 Hyperlipidemia, unspecified: Secondary | ICD-10-CM

## 2024-01-10 LAB — BAYER DCA HB A1C WAIVED: HB A1C (BAYER DCA - WAIVED): 7.1 % — ABNORMAL HIGH (ref 4.8–5.6)

## 2024-01-10 LAB — LIPID PANEL

## 2024-01-10 NOTE — Progress Notes (Signed)
 Established Patient Office Visit  Subjective:  Patient ID: Rebecca Cook, female    DOB: 05-06-1956  Age: 68 y.o. MRN: [id number]  CC:  Chief Complaint  Patient presents with   Medical Management of Chronic Issues    HPI Rebecca Cook presents for chronic follow up.   DM Patient denies foot ulcerations, hypoglycemia , nausea, paresthesia of the feet, visual disturbances, vomiting, and weight loss.   Current diabetic medications include trulicity 4.5 mg, metformin 1000 mg BID,  (doesn't tolerate higher dosages) humalog 8 units 3x a day, and basaglar 14 units at night Compliant with meds - Yes  Current monitoring regimen: CGM Home blood sugar records: Time in target 79% for last 90 days Any episodes of hypoglycemia? 6 in last month. Occurs both during the day and at night.   Weight trend: stable Current diet: well balanced  Walking daily.   2. HTN Complaint with meds - Yes Current Medications - losartan-hydrochlorothiazide BP at home- 120s/80s Pertinent ROS:  Visual Disturbances - No Chest pain - No Dyspnea - No Palpitations - No LE edema - No  3. Neuropathy Taking gabapentin TID with good relief.   4. Insomnia Taking trazodone with some improvement. Feels groggy initially but this doesn't last long. Depression/anxiety is well controlled right now.      01/10/2024   10:55 AM 10/12/2023    8:51 AM 08/11/2023    9:14 AM  Depression screen PHQ 2/9  Decreased Interest 0 0 0  Down, Depressed, Hopeless 0 0 0  PHQ - 2 Score 0 0 0  Altered sleeping 1 0 0  Tired, decreased energy 1 2 0  Change in appetite 0 0 0  Feeling bad or failure about yourself  0 0 0  Trouble concentrating 0 0 0  Moving slowly or fidgety/restless 0 0 0  Suicidal thoughts 0 0 0  PHQ-9 Score 2 2 0  Difficult doing work/chores Not difficult at all Not difficult at all Not difficult at all      01/10/2024   10:55 AM 10/12/2023    8:52 AM 07/06/2023    3:46 PM 04/05/2023    2:25 PM  GAD 7 :  Generalized Anxiety Score  Nervous, Anxious, on Edge 1 0 0 0  Control/stop worrying 0 0 0 0  Worry too much - different things 0 0 0 0  Trouble relaxing 0 0 0 0  Restless 0 0 0 0  Easily annoyed or irritable 0 0 0 0  Afraid - awful might happen 0 0 0 0  Total GAD 7 Score 1 0 0 0  Anxiety Difficulty Not difficult at all Not difficult at all Not difficult at all       Past Medical History:  Diagnosis Date   Allergy    Many years   Anxiety Recently   Looking into employer's EAP orogram for help   Arthritis This Spring   Right hand. Using a cream for pain relief   Cataract February 2022   Had cataracts removed from both eyes summer of 2022   Depression    Something I've experienced in my life but not often   Diabetes mellitus, type II (HCC)    Frequent headaches    GERD (gastroesophageal reflux disease)    Hypertension    Kidney stones    None current, last had kidney stones in 1986   Neuromuscular disorder (HCC)    Taking Gabapentin    No Known Allergies  ROS Review  of Systems As per HPI.    Objective:    Physical Exam Vitals and nursing note reviewed.  Constitutional:      General: She is not in acute distress.    Appearance: She is not ill-appearing, toxic-appearing or diaphoretic.  HENT:     Nose: Nose normal.     Mouth/Throat:     Mouth: Mucous membranes are moist.     Pharynx: Oropharynx is clear.  Cardiovascular:     Rate and Rhythm: Normal rate and regular rhythm.     Heart sounds: Normal heart sounds. No murmur heard. Pulmonary:     Effort: Pulmonary effort is normal. No respiratory distress.     Breath sounds: Normal breath sounds.  Abdominal:     General: Bowel sounds are normal. There is no distension.     Palpations: Abdomen is soft.     Tenderness: There is no abdominal tenderness. There is no guarding or rebound.  Musculoskeletal:     Right lower leg: No edema.     Left lower leg: No edema.  Skin:    General: Skin is warm and dry.   Neurological:     General: No focal deficit present.     Mental Status: She is alert and oriented to person, place, and time.  Psychiatric:        Mood and Affect: Mood normal.        Behavior: Behavior normal.     BP 125/88 Comment: home reading per patient  Pulse 73   Temp 97.6 F (36.4 C) (Temporal)   Ht 5\' 6"  (1.676 m)   Wt 196 lb (88.9 kg)   SpO2 100%   BMI 31.64 kg/m  BP Readings from Last 3 Encounters:  01/10/24 125/88  10/12/23 121/78  07/06/23 122/73    Wt Readings from Last 3 Encounters:  01/10/24 196 lb (88.9 kg)  10/12/23 192 lb (87.1 kg)  08/11/23 191 lb (86.6 kg)     There are no preventive care reminders to display for this patient.   There are no preventive care reminders to display for this patient.  Lab Results  Component Value Date   TSH 2.140 10/12/2023   Lab Results  Component Value Date   WBC 9.2 07/06/2023   HGB 14.3 07/06/2023   HCT 43.1 07/06/2023   MCV 85 07/06/2023   PLT 229 07/06/2023   Lab Results  Component Value Date   NA 142 07/06/2023   K 3.9 07/06/2023   CO2 24 07/06/2023   GLUCOSE 103 (H) 07/06/2023   BUN 19 07/06/2023   CREATININE 0.82 07/06/2023   BILITOT 0.3 07/06/2023   ALKPHOS 74 07/06/2023   AST 13 07/06/2023   ALT 9 07/06/2023   PROT 7.0 07/06/2023   ALBUMIN 4.5 07/06/2023   CALCIUM 9.5 07/06/2023   ANIONGAP 12 02/19/2020   EGFR 78 07/06/2023   GFR 94.70 03/02/2020   Lab Results  Component Value Date   CHOL 125 07/06/2023   Lab Results  Component Value Date   HDL 42 07/06/2023   Lab Results  Component Value Date   LDLCALC 57 07/06/2023   Lab Results  Component Value Date   TRIG 154 (H) 07/06/2023   Lab Results  Component Value Date   CHOLHDL 3.0 07/06/2023   Lab Results  Component Value Date   HGBA1C 6.7 (H) 10/12/2023      Assessment & Plan:   Rebecca Cook was seen today for medical management of chronic issues.  Diagnoses and all orders  for this visit:  Type 2 diabetes mellitus  with diabetic neuropathy, with long-term current use of insulin (HCC) A1c 7.1 today, not at goal of <7. Medication changes today: continue current regimen. She is having quite a few hypoglycemia events but is in target. 80% of the time. Discussed follow up with clinical pharmacist to see if she would do better with a sliding scale for humalog. She is on an ACE/ARB and statin. Diet and exercise.  -     Bayer DCA Hb A1c Waived  Hypertension associated with diabetes (HCC) Well controlled on current regimen.  -     CBC with Differential/Platelet -     CMP14+EGFR  Hyperlipidemia associated with type 2 diabetes mellitus (HCC) On statin. Last LDL 57. -     Lipid panel  Insomnia due to medical condition Continue trazodone.   Class 1 obesity due to excess calories with serious comorbidity and body mass index (BMI) of 30.0 to 30.9 in adult Diet and exercise.     Follow-up: Return in about 3 months (around 04/10/2024) for chronic follow up.  The patient indicates understanding of these issues and agrees with the plan.    Albertha Huger, FNP

## 2024-01-11 ENCOUNTER — Encounter: Payer: Self-pay | Admitting: Family Medicine

## 2024-01-11 LAB — CBC WITH DIFFERENTIAL/PLATELET
Basophils Absolute: 0.1 10*3/uL (ref 0.0–0.2)
Basos: 1 %
EOS (ABSOLUTE): 0.2 10*3/uL (ref 0.0–0.4)
Eos: 3 %
Hematocrit: 41.9 % (ref 34.0–46.6)
Hemoglobin: 13.9 g/dL (ref 11.1–15.9)
Immature Grans (Abs): 0 10*3/uL (ref 0.0–0.1)
Immature Granulocytes: 0 %
Lymphocytes Absolute: 2 10*3/uL (ref 0.7–3.1)
Lymphs: 30 %
MCH: 27.9 pg (ref 26.6–33.0)
MCHC: 33.2 g/dL (ref 31.5–35.7)
MCV: 84 fL (ref 79–97)
Monocytes Absolute: 0.6 10*3/uL (ref 0.1–0.9)
Monocytes: 8 %
Neutrophils Absolute: 3.8 10*3/uL (ref 1.4–7.0)
Neutrophils: 58 %
Platelets: 214 10*3/uL (ref 150–450)
RBC: 4.98 x10E6/uL (ref 3.77–5.28)
RDW: 13.7 % (ref 11.7–15.4)
WBC: 6.7 10*3/uL (ref 3.4–10.8)

## 2024-01-11 LAB — CMP14+EGFR
ALT: 13 IU/L (ref 0–32)
AST: 14 IU/L (ref 0–40)
Albumin: 4.4 g/dL (ref 3.9–4.9)
Alkaline Phosphatase: 66 IU/L (ref 44–121)
BUN/Creatinine Ratio: 20 (ref 12–28)
BUN: 15 mg/dL (ref 8–27)
Bilirubin Total: 0.5 mg/dL (ref 0.0–1.2)
CO2: 25 mmol/L (ref 20–29)
Calcium: 9.6 mg/dL (ref 8.7–10.3)
Chloride: 103 mmol/L (ref 96–106)
Creatinine, Ser: 0.75 mg/dL (ref 0.57–1.00)
Globulin, Total: 2.4 g/dL (ref 1.5–4.5)
Glucose: 122 mg/dL — ABNORMAL HIGH (ref 70–99)
Potassium: 4.3 mmol/L (ref 3.5–5.2)
Sodium: 143 mmol/L (ref 134–144)
Total Protein: 6.8 g/dL (ref 6.0–8.5)
eGFR: 86 mL/min/{1.73_m2} (ref >59)

## 2024-01-11 LAB — LIPID PANEL
Cholesterol, Total: 111 mg/dL (ref 100–199)
HDL: 45 mg/dL (ref >39)
LDL CALC COMMENT:: 2.5 ratio (ref 0.0–4.4)
LDL Chol Calc (NIH): 44 mg/dL (ref 0–99)
Triglycerides: 125 mg/dL (ref 0–149)
VLDL Cholesterol Cal: 22 mg/dL (ref 5–40)

## 2024-01-31 ENCOUNTER — Telehealth: Payer: Self-pay

## 2024-01-31 NOTE — Progress Notes (Signed)
 Care Guide Pharmacy Note  01/31/2024 Name: Rebecca Cook MRN: [id number] DOB: [birth year]  Referred By: Albertha Huger, FNP Reason for referral: Complex Care Management (Outreach to schedule with Pharm d )   Rebecca Cook is a 68 y.o. year old female who is a primary care patient of Albertha Huger, FNP.  Rebecca Cook was referred to the pharmacist for assistance related to: DMII  An unsuccessful telephone outreach was attempted today to contact the patient who was referred to the pharmacy team for assistance with medication management. Additional attempts will be made to contact the patient.  Lenton Rail , RMA     [facility]  [facility], [facility] Guide  Direct Dial : [phone]  Website: [email or web address]

## 2024-02-06 NOTE — Progress Notes (Signed)
 Care Guide Pharmacy Note  02/06/2024 Name: [name] MRN: [id number] DOB: [birth year]  Referred By: Albertha Huger, FNP Reason for referral: Complex Care Management (Outreach to schedule with Pharm d )   Rebecca Cook is a 67 y.o. year old female who is a primary care patient of Albertha Huger, FNP.  Autumn Lehmann was referred to the pharmacist for assistance related to: DMII  A second unsuccessful telephone outreach was attempted today to contact the patient who was referred to the pharmacy team for assistance with medication management. Additional attempts will be made to contact the patient.  Lenton Rail , RMA     [facility]  [facility], [facility] Guide  Direct Dial : [phone]  Website: [email or web address]

## 2024-02-15 DIAGNOSIS — H52223 Regular astigmatism, bilateral: Secondary | ICD-10-CM | POA: Diagnosis not present

## 2024-02-15 DIAGNOSIS — Z961 Presence of intraocular lens: Secondary | ICD-10-CM | POA: Diagnosis not present

## 2024-02-15 DIAGNOSIS — H524 Presbyopia: Secondary | ICD-10-CM | POA: Diagnosis not present

## 2024-02-15 DIAGNOSIS — E083293 Diabetes mellitus due to underlying condition with mild nonproliferative diabetic retinopathy without macular edema, bilateral: Secondary | ICD-10-CM | POA: Diagnosis not present

## 2024-02-15 DIAGNOSIS — H5203 Hypermetropia, bilateral: Secondary | ICD-10-CM | POA: Diagnosis not present

## 2024-02-15 LAB — HM DIABETES EYE EXAM

## 2024-02-15 NOTE — Progress Notes (Signed)
 Care Guide Pharmacy Note  02/15/2024 Name: MOSETTA FERDINAND MRN: [id number] DOB: [birth year]  Referred By: Albertha Huger, FNP Reason for referral: Complex Care Management (Outreach to schedule with Pharm d )   CORRA KAINE is a 68 y.o. year old female who is a primary care patient of Albertha Huger, FNP.  QUINCEE GITTENS was referred to the pharmacist for assistance related to: DMII  A third unsuccessful telephone outreach was attempted today to contact the patient who was referred to the pharmacy team for assistance with medication management. The Population Health team is pleased to engage with this patient at any time in the future upon receipt of referral and should he/she be interested in assistance from the [employer] [facility] team.  Lenton Rail , RMA     [facility]  [facility], [facility] Guide  Direct Dial : [phone]  Website: [email or web address]

## 2024-02-19 DIAGNOSIS — B349 Viral infection, unspecified: Secondary | ICD-10-CM | POA: Diagnosis not present

## 2024-02-19 DIAGNOSIS — Z20822 Contact with and (suspected) exposure to covid-19: Secondary | ICD-10-CM | POA: Diagnosis not present

## 2024-02-21 NOTE — Progress Notes (Signed)
 Care Guide Pharmacy Note  02/21/2024 Name: Rebecca Cook MRN: [id number] DOB: [birth year]  Referred By: Albertha Huger, FNP Reason for referral: Complex Care Management (Outreach to schedule with Pharm d )   Rebecca Cook is a 68 y.o. year old female who is a primary care patient of Albertha Huger, FNP.  Rebecca Cook was referred to the pharmacist for assistance related to: DMII  Successful contact was made with the patient to discuss pharmacy services including being ready for the pharmacist to call at least 5 minutes before the scheduled appointment time and to have medication bottles and any blood pressure readings ready for review. The patient agreed to meet with the pharmacist via telephone visit on (date/time).02/28/2024  Lenton Rail , RMA     [place]  [facility], [facility] Guide  Direct Dial : [phone]  Website: [email or web address]

## 2024-02-28 ENCOUNTER — Other Ambulatory Visit (INDEPENDENT_AMBULATORY_CARE_PROVIDER_SITE_OTHER)

## 2024-02-28 DIAGNOSIS — Z7985 Long-term (current) use of injectable non-insulin antidiabetic drugs: Secondary | ICD-10-CM

## 2024-02-28 DIAGNOSIS — Z7984 Long term (current) use of oral hypoglycemic drugs: Secondary | ICD-10-CM

## 2024-02-28 DIAGNOSIS — E1140 Type 2 diabetes mellitus with diabetic neuropathy, unspecified: Secondary | ICD-10-CM

## 2024-02-28 DIAGNOSIS — Z794 Long term (current) use of insulin: Secondary | ICD-10-CM

## 2024-02-28 MED ORDER — TRULICITY 3 MG/0.5ML ~~LOC~~ SOAJ: 3.0000 mg | SUBCUTANEOUS | 5 refills | Status: AC

## 2024-02-28 NOTE — Progress Notes (Signed)
 02/28/2024 Name: JOSLYN RAMOS MRN: [id number] DOB: 05/25/1956  Chief Complaint  Patient presents with   Diabetes    ARPITA FENTRESS is a 68 y.o. year old female who presented for a telephone visit. I connected with  TAITUM MENTON on 02/28/24 by telephone and verified that I am speaking with the correct person using two identifiers. I discussed the limitations of evaluation and management by telemedicine. The patient expressed understanding and agreed to proceed.    They were referred to the pharmacist by their PCP for assistance in managing diabetes.   Patient was last seen by PCP on 01/11/24 and A1C at that visit was 7.1%. Libre CGM report showed TIR 79% for the last 90 days. However, patient had 6 epsiodes of hypoglycemia in the past month occurring both during the day and at night. She was referred to pharmacy for diabetes medication management follow up.  Care Team: Primary Care Provider: Albertha Huger, FNP ; Next Scheduled Visit: 04/11/24  Medication Access/Adherence  Current Pharmacy:  [facility] [place], [place] - [address] [place] [address] Phone: [phone] Fax: [phone]  [id number] [place], [place] - [address] [place] [address] Phone: [phone] Fax: [phone]  [facility] [place], [place] - [address] [place] [address] Phone: [phone] Fax: [phone]  [facility] [phone] [place], [place] - [address] [place] [address] Phone: [phone] Fax: [phone]   Patient reports affordability concerns with their medications: No  Patient reports access/transportation concerns to their pharmacy: No  Patient reports adherence concerns with their medications:  No    Subjective: Patient reports she has random episodes of hyper and  hypoglycemia. States it is difficult to identify a pattern, but generally says that Jerrilyn Moras will alert her to hyperglycemia overnight at least once a week and she will also have lows about twice a week that occur during the day or at night. She appropriately treats lows with juice. She has her apple watch connected to Jerrilyn Moras so she is able view her BG at all times and get alerts. We connected her Jerrilyn Moras to [facility] view today while on the phone.  Patient reports Trulicity  has taken away her appetite. Often feels nauseous and has to force herself to eat especially in the mornings. Thinks this is contributing to her fatigue and ability to do things since she is eating very small portions.  Diabetes:  Current medications:  Trulicity  4.5 mg weekly Basaglar  14 units at night Humalog  8 units TID with meals - she is injecting before meals, will take 1 additional unit per 10 gram of carbs if having a very carb heavy food like pizza or a lot of bread/pasta but she says this is rare, most of time only taking 8 units metformin  XR 500 mg - 1 tablet BID  Libre 3+ 14 day report: Date of Download: 02/28/24 % Time CGM is active: 98% Average Glucose: 245 mg/dL Glucose Management Indicator: 6.8  Glucose Variability: 25 (goal <36%) Time in Goal:  - Time in range 70-180: 84% - Time above range: 16% - Time below range: 0% Observed patterns: episodes of hypoglycemia that have occurred after lunch, after breakfast, and overnight     Patient reports hypoglycemic s/sx including dizziness, shakiness, sweating. Patient denies hyperglycemic symptoms including polyuria, polydipsia, polyphagia, nocturia, neuropathy,  blurred vision.  Current meal patterns:  - Eats 3 meals/day - Eats small portions, eats at [employer] - Breakfast: piece of toast, egg mcmuffin with bacon (occasionally) - Lunch: beans with vegetables and salad - Supper usually 5-7 PM: beans and veggies, grilled chicken sandwich, salmon, occasional  pasta - Drinks: water with lemon, milk (2% or 1%)  Current medication access support: Basaglar , Humalog , and Trulicity  through [employer] PAP  Objective:  Lab Results  Component Value Date   HGBA1C 7.1 (H) 01/10/2024    Lab Results  Component Value Date   CREATININE 0.75 01/10/2024   BUN 15 01/10/2024   NA 143 01/10/2024   K 4.3 01/10/2024   CL 103 01/10/2024   CO2 25 01/10/2024    Lab Results  Component Value Date   CHOL 111 01/10/2024   HDL 45 01/10/2024   LDLCALC 44 01/10/2024   LDLDIRECT 195.0 10/17/2017   TRIG 125 01/10/2024   CHOLHDL 2.5 01/10/2024    Medications Reviewed Today     Reviewed by Philmore Bream, RPH (Pharmacist) on 02/28/24 at 1601  Med List Status: <None>   Medication Order Taking? Sig Documenting Provider Last Dose Status Informant  Continuous Glucose Sensor (FREESTYLE LIBRE 3 SENSOR) MISC [id number]  1 each by Does not apply route every 14 (fourteen) days. Albertha Huger, FNP  Active   Dulaglutide  (TRULICITY ) 4.5 MG/0.5ML Stevens Eland [id number] Yes Inject 4.5 mg as directed once a week. Albertha Huger, FNP Taking Active   fluticasone  (FLONASE ) 50 MCG/ACT nasal spray [id number]  Place 2 sprays into both nostrils daily. Albertha Huger, FNP  Active   gabapentin  (NEURONTIN ) 600 MG tablet [id number]  Take 0.5 tablets (300 mg total) by mouth 3 (three) times daily. TAKE 0.5 TABLET (300 MG TOTAL) BY MOUTH 3 TIMES DAILY. Albertha Huger, FNP  Active   Insulin  Glargine (BASAGLAR  [facility] 100 UNIT/ML [id number] Yes Inject 32 Units into the skin at bedtime. Albertha Huger, FNP Taking Active            Med Note Philmore Bream   Wed Feb 28, 2024  4:01 PM) Taking 14 units daily  insulin  lispro (HUMALOG  KWIKPEN) 100 UNIT/ML KwikPen [id number] Yes Inject 8 Units into the skin 3 (three) times daily. Before each meal, 3x daily. For blood sugar of 140-199: 2 units, 200-250: 4 units, 251-299: 8 units, 300-349: 10 units, for 350 or above: 14 units Daily max dose 42 u  total Britta Candy, Tiffany M, FNP Taking Active   loratadine (CLARITIN) 10 MG tablet [id number]  Take 10 mg by mouth daily. [provider]  Active   losartan -hydrochlorothiazide (HYZAAR) 100-25 MG tablet [id number]  Take 1 tablet by mouth daily. Albertha Huger, FNP  Active   metFORMIN  (GLUCOPHAGE -XR) 500 MG 24 hr tablet [id number] Yes TAKE 2 TABLETS BY MOUTH TWICE A DAY WITH MEALS  Patient taking differently: Take 500 mg by mouth daily. TAKE 2 TABLETS BY MOUTH TWICE A DAY WITH MEALS   Albertha Huger, FNP Taking Active            Med Note Philmore Bream   Wed Feb 28, 2024  4:01 PM) Taking 1 tablet twice daily  Multiple Vitamins-Minerals (MULTIVITAMIN PO) [id number]  Take 1 tablet by mouth daily.  [provider]  Active Pharmacy Records  rosuvastatin  (CRESTOR ) 10 MG tablet [id number]  Take 1 tablet (10 mg total) by mouth daily. Albertha Huger, FNP  Active   traZODone  (DESYREL ) 50 MG tablet [id number]  Take 0.5-1 tablets (25-50 mg total) by mouth at bedtime as needed for sleep. Albertha Huger, FNP  Active   Med List Note Melodie Spry 02/16/20 1543): Daughter Dancy: [phone]              Assessment/Plan:   Diabetes: - Currently uncontrolled but close to goal with last A1C 7.1% on 01/10/24. Jerrilyn Moras view reveals improvement with TIR 84% and GMI 6.8%. Appropriate to decrease Trulicity  to 3 mg given significant side effects that she feels are affecting her quality of life. Concern for hypoglycemia so will also decrease Humalog  slightly and continue current basal insulin  dose (~0.2 units/kg). Extensively reviewed diet today and counseled on the importance of incorporating protein and fiber with meals to help stabilize BG. Will follow up closely next week to assess response. - Reviewed long term cardiovascular and renal outcomes of uncontrolled blood sugar - Reviewed goal A1c, goal fasting, and goal 2 hour post prandial glucose - Reviewed appropriate management  of hypoglycemia - Recommend to decrease Humalog  to 7 units TID with meals - Recommend to continue Basaglar  14 units daily - Recommend to decrease Trulicity  to 3 mg weekly - Recommend to continue metformin  XR 500 mg BID  - Patient denies personal or family history of multiple endocrine neoplasia type 2, medullary thyroid  cancer; personal history of pancreatitis or gallbladder disease - Recommend to check glucose continuously with Libre 3+ CGM - Approved for [employer] PAP for Trulicity , Basaglar , and Humalog  - Appropriately treated with moderate intensity statin (rosuvastatin  10 mg daily) and last LDL well controlled at 44 mg/dL in April   Follow Up Plan: PharmD on 03/06/24  Georga Killings, PharmD PGY-1 Pharmacy Resident

## 2024-03-05 NOTE — Progress Notes (Unsigned)
 03/06/2024 Name: Rebecca Cook MRN: [id number] DOB: 1956/06/08  Chief Complaint  Patient presents with   Diabetes   I connected with  Autumn Lehmann on 03/06/24 by telephone and verified that I am speaking with the correct person using two identifiers. I discussed the limitations of evaluation and management by telemedicine. The patient expressed understanding and agreed to proceed.  Patient was located in her home and PharmD in PCP office during this visit.  Rebecca Cook is a 68 y.o. year old female who presented for a telephone visit.   They were referred to the pharmacist by their PCP for assistance in managing diabetes.   Patient was last seen by PCP on 01/11/24 and A1C at that visit was 7.1%. Libre CGM report showed TIR 79% for the last 90 days. However, patient had 6 epsiodes of hypoglycemia in the past month occurring both during the day and at night. She was referred to pharmacy for diabetes medication management follow up. At last PharmD visit on 02/28/24 we decreased her Humalog  dose.  Care Team: Primary Care Provider: Albertha Huger, FNP ; Next Scheduled Visit: 04/11/24  Medication Access/Adherence  Current Pharmacy:  [facility] [place], [place] - [address] [place] [address] Phone: [phone] Fax: [phone]  [id number] [place], [place] - [address] [place] [address] Phone: [phone] Fax: [phone]  [facility] - [place], [place] - [address] [place] [address] Phone: [phone] Fax: [phone]  [phone] [place], [place] - [address] [place] [address] Phone: [phone] Fax: [phone]   Patient reports affordability concerns with their medications: No  Patient reports access/transportation concerns to their pharmacy: No   Patient reports adherence concerns with their medications:  No    Subjective: Patient reports since decreasing Humalog  last week she has only had 1 episode of hypoglycemia. This occurred yesterday morning when BG spiked to ~260 after drinking her coffee with splenda and half and half. She said she had not had anything to eat yet when this occurred and she took 5 units of Humalog  which caused her BG to drop to 68. Reports she has been taking Humalog  7 units with meals except for one night this past week when she had pasta for dinner she took 10 units. Notes she has eaten more protein this past week which is helping to stabilize her blood sugar.  Diabetes:  Current medications:  Trulicity  4.5 mg weekly Basaglar  14 units at night Humalog  7 units TID with meals - 1 additional unit per 10 grams of carb if having a very carb heavy food like pizza, pasta, or a lot of bread, although this is rare Metformin  XR 500 mg - 1 tablet BID  Date of Download: 03/06/24 - 7 day report  % Time CGM is active: 91% Average Glucose: 156 mg/dL Glucose Management Indicator: 7.0  Glucose Variability: 23.8 (goal <36%) Time in Goal:  - Time in range 70-180: 75% - Time above range: 25% - Time below range: 0%      Current meal patterns:  - Eats 3 meals/day - Eats small portions, eats at [employer] - Breakfast: piece of toast, egg mcmuffin with canadian bacon that she makes at home (occasionally) - Lunch: beans with vegetables and salad - Supper usually 5-7 PM: beans and veggies, grilled chicken  sandwich, salmon, occasional pasta - Drinks: water with lemon, milk (2% or 1%), coffee with splenda and half and half  Current medication access support: Basaglar , Humalog , and Trulicity  through [employer] PAP    Objective:  Lab Results  Component Value Date   HGBA1C 7.1 (H) 01/10/2024    Lab Results  Component Value Date   CREATININE 0.75 01/10/2024   BUN 15 01/10/2024   NA 143 01/10/2024   K 4.3  01/10/2024   CL 103 01/10/2024   CO2 25 01/10/2024    Lab Results  Component Value Date   CHOL 111 01/10/2024   HDL 45 01/10/2024   LDLCALC 44 01/10/2024   LDLDIRECT 195.0 10/17/2017   TRIG 125 01/10/2024   CHOLHDL 2.5 01/10/2024    Medications Reviewed Today     Reviewed by Philmore Bream, RPH (Pharmacist) on 03/06/24 at 1138  Med List Status: <None>   Medication Order Taking? Sig Documenting Provider Last Dose Status Informant  Continuous Glucose Sensor (FREESTYLE LIBRE 3 SENSOR) MISC [id number] No 1 each by Does not apply route every 14 (fourteen) days. Albertha Huger, FNP Taking Active   Dulaglutide  (TRULICITY ) 3 MG/0.5ML Stevens Eland [id number]  Inject 3 mg as directed once a week. Albertha Huger, FNP  Active   fluticasone  (FLONASE ) 50 MCG/ACT nasal spray [id number] No Place 2 sprays into both nostrils daily. Albertha Huger, FNP Taking Active   gabapentin  (NEURONTIN ) 600 MG tablet [id number] No Take 0.5 tablets (300 mg total) by mouth 3 (three) times daily. TAKE 0.5 TABLET (300 MG TOTAL) BY MOUTH 3 TIMES DAILY. Albertha Huger, FNP Taking Active   Insulin  Glargine (BASAGLAR  [facility]) 100 UNIT/ML [id number] No Inject 32 Units into the skin at bedtime. Albertha Huger, FNP Taking Active            Med Note Philmore Bream   Wed Feb 28, 2024  4:01 PM) Taking 14 units daily  insulin  lispro (HUMALOG  KWIKPEN) 100 UNIT/ML KwikPen [id number] No Inject 8 Units into the skin 3 (three) times daily. Before each meal, 3x daily. For blood sugar of 140-199: 2 units, 200-250: 4 units, 251-299: 8 units, 300-349: 10 units, for 350 or above: 14 units Daily max dose 42 u total Britta Candy, Tiffany M, FNP Taking Active   loratadine (CLARITIN) 10 MG tablet [id number] No Take 10 mg by mouth daily. [provider] Taking Active   losartan -hydrochlorothiazide (HYZAAR) 100-25 MG tablet [id number] No Take 1 tablet by mouth daily. Albertha Huger, FNP Taking Active   metFORMIN  (GLUCOPHAGE -XR) 500 MG 24  hr tablet [id number] No TAKE 2 TABLETS BY MOUTH TWICE A DAY WITH MEALS  Patient taking differently: Take 500 mg by mouth daily. TAKE 2 TABLETS BY MOUTH TWICE A DAY WITH MEALS   Albertha Huger, FNP Taking Active            Med Note Philmore Bream   Wed Feb 28, 2024  4:01 PM) Taking 1 tablet twice daily  Multiple Vitamins-Minerals (MULTIVITAMIN PO) [id number] No Take 1 tablet by mouth daily.  [provider] Taking Active Pharmacy Records  rosuvastatin  (CRESTOR ) 10 MG tablet [id number] No Take 1 tablet (10 mg total) by mouth daily. Albertha Huger, FNP Taking Active   traZODone  (DESYREL ) 50 MG tablet [id number] No Take 0.5-1 tablets (25-50 mg total) by mouth at bedtime as needed for sleep. Albertha Huger, FNP Taking Active   Med List Note (Satterfield, Darius E, CPhT 02/16/20 1543): Daughter Dancy: [phone]  Assessment/Plan:   Diabetes: - Currently uncontrolled but close to goal with last A1C 7.1% on 01/10/24. LibreView reveals improvement with TIR 75% and GMI 7.0% in the past week. Hypoglycemia has improved with insulin  adjustments made last week. Episode of hypoglycemia yesterday was due to overestimation of Humalog  correction dose. Recommended to use ISF of 50 based on the the rule of 1800 (1800/35 units= ~50) and target BG 120. Counseled her on how to use this math to determine appropriate insulin  correction dose to use if BG spikes in between meals. Also counseled to avoid insulin  stacking by waiting at least 3 hours after Humalog  injection to correct her BG if needed and avoid hypoglycemia. She will likely need more insulin  adjustments when decreasing to Trulicity  3 mg and she appreciates continued follow up with pharmacy.  - Reviewed long term cardiovascular and renal outcomes of uncontrolled blood sugar - Reviewed goal A1c, goal fasting, and goal 2 hour post prandial glucose - Reviewed dietary modifications including: encouraged her to continue incorporating  more protein in diet to help stabilize BG - Recommend to continue Trulicity  4.5 mg weekly and transition to 3 mg when new supply comes through PAP - Recommend to continue Basaglar  14 units daily - Recommend to continue Humalog  7 units TID with meals plus 1 additional unit per 10 g of carb for heavier carb meals. Also reviewed ISF 50 and target BG 120 for correction dose insulin .   - Patient denies personal or family history of multiple endocrine neoplasia type 2, medullary thyroid  cancer; personal history of pancreatitis or gallbladder disease. - Recommend to check glucose continuously with Libre 3 plus CGM - Appropriately treated with moderate intensity statin (rosuvastatin  10 mg daily) and last LDL well controlled at 44 mg/dL in April  - Approved for [employer] PAP for Trulicity , Basaglar , and Humalog      Follow Up Plan: PharmD on 03/20/24 and PCP on 04/25/24  Georga Killings, PharmD PGY-1 Pharmacy Resident

## 2024-03-06 ENCOUNTER — Other Ambulatory Visit (INDEPENDENT_AMBULATORY_CARE_PROVIDER_SITE_OTHER): Admitting: Pharmacist

## 2024-03-06 DIAGNOSIS — Z7985 Long-term (current) use of injectable non-insulin antidiabetic drugs: Secondary | ICD-10-CM

## 2024-03-06 DIAGNOSIS — Z7984 Long term (current) use of oral hypoglycemic drugs: Secondary | ICD-10-CM

## 2024-03-06 DIAGNOSIS — E1140 Type 2 diabetes mellitus with diabetic neuropathy, unspecified: Secondary | ICD-10-CM

## 2024-03-06 DIAGNOSIS — D225 Melanocytic nevi of trunk: Secondary | ICD-10-CM | POA: Diagnosis not present

## 2024-03-06 DIAGNOSIS — L814 Other melanin hyperpigmentation: Secondary | ICD-10-CM | POA: Diagnosis not present

## 2024-03-06 DIAGNOSIS — Z794 Long term (current) use of insulin: Secondary | ICD-10-CM

## 2024-03-06 DIAGNOSIS — L578 Other skin changes due to chronic exposure to nonionizing radiation: Secondary | ICD-10-CM | POA: Diagnosis not present

## 2024-03-06 DIAGNOSIS — L821 Other seborrheic keratosis: Secondary | ICD-10-CM | POA: Diagnosis not present

## 2024-03-18 ENCOUNTER — Other Ambulatory Visit: Payer: Self-pay | Admitting: Family Medicine

## 2024-03-18 DIAGNOSIS — E1165 Type 2 diabetes mellitus with hyperglycemia: Secondary | ICD-10-CM

## 2024-03-20 ENCOUNTER — Encounter: Payer: Self-pay | Admitting: Pharmacist

## 2024-03-20 ENCOUNTER — Other Ambulatory Visit (INDEPENDENT_AMBULATORY_CARE_PROVIDER_SITE_OTHER): Admitting: Pharmacist

## 2024-03-20 DIAGNOSIS — Z794 Long term (current) use of insulin: Secondary | ICD-10-CM

## 2024-03-20 DIAGNOSIS — Z7985 Long-term (current) use of injectable non-insulin antidiabetic drugs: Secondary | ICD-10-CM

## 2024-03-20 DIAGNOSIS — E119 Type 2 diabetes mellitus without complications: Secondary | ICD-10-CM

## 2024-04-09 NOTE — Progress Notes (Signed)
 03/20/2024 Name: Rebecca Cook            MRN: [id number]       DOB: 11/23/1955      Chief Complaint  Patient presents with   Diabetes    I connected with  Rebecca Cook on 03/20/24 by telephone and verified that I am speaking with the correct person using two identifiers. I discussed the limitations of evaluation and management by telemedicine. The patient expressed understanding and agreed to proceed.  Patient was located in her home and PharmD in PCP office during this visit.   Rebecca Cook is a 68 y.o. year old female who presented for a telephone visit.   They were referred to the pharmacist by their PCP for assistance in managing diabetes.    Patient was last seen by PCP on 01/11/24 and A1C at that visit was 7.1%. Libre CGM report shows time in range is at goal, however had 6 epsiodes of hypoglycemia in the past month occurring both during the day and at night. She was referred to pharmacy for diabetes medication management follow up. At last PharmD visit on 02/28/24 we decreased her Humalog  dose.   Care Team: Primary Care Provider: Joesph Annabella HERO, FNP ; Next Scheduled Visit: 04/11/24   Medication Access/Adherence   Current Pharmacy:  [facility] [place], [place] - [address] [place] [address] Phone: [phone] Fax: [phone]   [id number] [place], [place] - [address] [place] [address] Phone: [phone] Fax: [phone]   [facility] [place], [place] - [address] [place] [address] Phone: [phone] Fax: [phone]   [facility] [phone] [place], [place] - [address] [place]  [place] [address] Phone: [phone] Fax: [phone]     Patient reports affordability concerns with their medications: No  Patient reports access/transportation concerns to  their pharmacy: No  Patient reports adherence concerns with their medications:  No     Subjective: Patient reports since decreasing Humalog  last week her hypoglycemia has resolved. Reports she has been taking Humalog  7 units with meals except for one night this past week when she had pasta for dinner she took 10 units. Notes she has eaten more protein this past week which is helping to stabilize her blood sugar.   Diabetes:   Current medications:  Trulicity  4.5 mg weekly Basaglar  14 units at night Humalog  7 units TID with meals - 1 additional unit per 10 grams of carb if having a very carb heavy food like pizza, pasta, or a lot of bread, although this is rare Metformin  XR 500 mg - 1 tablet BID         Current meal patterns:  - Eats 3 meals/day - Eats small portions, eats at [employer] - Breakfast: piece of toast, egg mcmuffin with canadian bacon that she makes at home (occasionally) - Lunch: beans with vegetables and salad - Supper usually 5-7 PM: beans and veggies, grilled chicken sandwich, salmon, occasional pasta - Drinks: water with lemon, milk (2% or 1%), coffee with splenda and half and half   Current medication access support: Basaglar , Humalog , and Trulicity  through [employer] PAP      Objective:   Recent Labs       Lab Results  Component Value Date    HGBA1C 7.1 (H) 01/10/2024  Recent Labs       Lab Results  Component Value Date    CREATININE 0.75 01/10/2024    BUN 15 01/10/2024    NA 143 01/10/2024    K 4.3 01/10/2024    CL 103 01/10/2024    CO2 25 01/10/2024        Recent Labs       Lab Results  Component Value Date    CHOL 111 01/10/2024    HDL 45 01/10/2024    LDLCALC 44 01/10/2024    LDLDIRECT 195.0 10/17/2017    TRIG 125 01/10/2024    CHOLHDL 2.5 01/10/2024        Medications Reviewed Today       Reviewed by Bonnell Izetta SAUNDERS, RPH (Pharmacist) on 03/06/24 at 1138  Med List Status: <None>    Medication Order Taking? Sig  Documenting Provider Last Dose Status Informant  Continuous Glucose Sensor (FREESTYLE LIBRE 3 SENSOR) MISC [id number] No 1 each by Does not apply route every 14 (fourteen) days. Joesph Annabella HERO, FNP Taking Active    Dulaglutide  (TRULICITY ) 3 MG/0.5ML EMMANUEL [id number]   Inject 3 mg as directed once a week. Joesph Annabella HERO, FNP   Active    fluticasone  (FLONASE ) 50 MCG/ACT nasal spray [id number] No Place 2 sprays into both nostrils daily. Joesph Annabella HERO, FNP Taking Active    gabapentin  (NEURONTIN ) 600 MG tablet [id number] No Take 0.5 tablets (300 mg total) by mouth 3 (three) times daily. TAKE 0.5 TABLET (300 MG TOTAL) BY MOUTH 3 TIMES DAILY. Joesph Annabella HERO, FNP Taking Active    Insulin  Glargine (BASAGLAR  KWIKPEN) 100 UNIT/ML [id number] No Inject 32 Units into the skin at bedtime. Joesph Annabella HERO, FNP Taking Active                     Med Note HASSIE IZETTA SAUNDERS   Wed Feb 28, 2024  4:01 PM) Taking 14 units daily  insulin  lispro (HUMALOG  KWIKPEN) 100 UNIT/ML KwikPen [id number] No Inject 8 Units into the skin 3 (three) times daily. Before each meal, 3x daily. For blood sugar of 140-199: 2 units, 200-250: 4 units, 251-299: 8 units, 300-349: 10 units, for 350 or above: 14 units Daily max dose 42 u total Joesph, Tiffany M, FNP Taking Active    loratadine (CLARITIN) 10 MG tablet [id number] No Take 10 mg by mouth daily. [provider] Taking Active    losartan -hydrochlorothiazide (HYZAAR) 100-25 MG tablet [id number] No Take 1 tablet by mouth daily. Joesph Annabella HERO, FNP Taking Active    metFORMIN  (GLUCOPHAGE -XR) 500 MG 24 hr tablet [id number] No TAKE 2 TABLETS BY MOUTH TWICE A DAY WITH MEALS  Patient taking differently: Take 500 mg by mouth daily. TAKE 2 TABLETS BY MOUTH TWICE A DAY WITH MEALS   Joesph Annabella HERO, FNP Taking Active                     Med Note HASSIE IZETTA SAUNDERS   Wed Feb 28, 2024  4:01 PM) Taking 1 tablet twice daily  Multiple Vitamins-Minerals (MULTIVITAMIN PO) [id number] No Take 1  tablet by mouth daily.  [provider] Taking Active Pharmacy Records  rosuvastatin  (CRESTOR ) 10 MG tablet [id number] No Take 1 tablet (10 mg total) by mouth daily. Joesph Annabella HERO, FNP Taking Active    traZODone  (DESYREL ) 50 MG tablet [id number] No Take 0.5-1 tablets (25-50 mg total) by mouth at bedtime as needed for sleep. Joesph Annabella HERO, FNP Taking Active  Med List Note Roselee Teena FORBES Bishop 02/16/20 1543): Daughter Dancy: [phone]                      Assessment/Plan:    Diabetes: - Currently uncontrolled but close to goal with last A1C 7.1% Hypoglycemia has improved with insulin  adjustments. Counseled her on how to use this math to determine appropriate insulin  correction dose to use if BG spikes in between meals. Also counseled to avoid insulin  stacking by waiting at least 3 hours after Humalog  injection to correct her BG if needed and avoid hypoglycemia. She will likely need more insulin  adjustments when decreasing to Trulicity  3 mg and she appreciates continued follow up with pharmacy.  - Reviewed long term cardiovascular and renal outcomes of uncontrolled blood sugar - Reviewed goal A1c, goal fasting, and goal 2 hour post prandial glucose - Reviewed dietary modifications including: encouraged her to continue incorporating more protein in diet to help stabilize BG - Recommend to continue Trulicity  4.5 mg weekly and transition to 3 mg when new supply comes through PAP - Recommend to continue Basaglar  14 units daily - Recommend to continue Humalog  7 units TID with meals plus 1 additional unit per 10 g of carb for heavier carb meals. Also reviewed ISF 50 and target BG 120 for correction dose insulin .   - Patient denies personal or family history of multiple endocrine neoplasia type 2, medullary thyroid  cancer; personal history of pancreatitis or gallbladder disease. - Recommend to check glucose continuously with Libre 3 plus CGM - Appropriately treated with  moderate intensity statin (rosuvastatin  10 mg daily) and last LDL well controlled at 44 mg/dL in April  - Approved for [employer] PAP for Trulicity , Basaglar , and Humalog         Follow Up Plan:  PCP on 04/25/24  Mliss Tarry Griffin, PharmD, BCACP, CPP Clinical Pharmacist, [facility]

## 2024-04-11 ENCOUNTER — Encounter: Payer: Self-pay | Admitting: Family Medicine

## 2024-04-11 ENCOUNTER — Ambulatory Visit: Payer: Self-pay | Admitting: Family Medicine

## 2024-04-11 ENCOUNTER — Ambulatory Visit: Admitting: Family Medicine

## 2024-04-11 VITALS — BP 139/85 | HR 79 | Temp 98.3°F | Ht 66.0 in | Wt 196.4 lb

## 2024-04-11 DIAGNOSIS — I152 Hypertension secondary to endocrine disorders: Secondary | ICD-10-CM | POA: Diagnosis not present

## 2024-04-11 DIAGNOSIS — E1140 Type 2 diabetes mellitus with diabetic neuropathy, unspecified: Secondary | ICD-10-CM | POA: Diagnosis not present

## 2024-04-11 DIAGNOSIS — E1169 Type 2 diabetes mellitus with other specified complication: Secondary | ICD-10-CM | POA: Diagnosis not present

## 2024-04-11 DIAGNOSIS — E6609 Other obesity due to excess calories: Secondary | ICD-10-CM | POA: Diagnosis not present

## 2024-04-11 DIAGNOSIS — E66811 Obesity, class 1: Secondary | ICD-10-CM

## 2024-04-11 DIAGNOSIS — Z6831 Body mass index (BMI) 31.0-31.9, adult: Secondary | ICD-10-CM | POA: Diagnosis not present

## 2024-04-11 DIAGNOSIS — Z7984 Long term (current) use of oral hypoglycemic drugs: Secondary | ICD-10-CM

## 2024-04-11 DIAGNOSIS — G4701 Insomnia due to medical condition: Secondary | ICD-10-CM | POA: Diagnosis not present

## 2024-04-11 DIAGNOSIS — E785 Hyperlipidemia, unspecified: Secondary | ICD-10-CM | POA: Diagnosis not present

## 2024-04-11 DIAGNOSIS — E1159 Type 2 diabetes mellitus with other circulatory complications: Secondary | ICD-10-CM

## 2024-04-11 DIAGNOSIS — Z7985 Long-term (current) use of injectable non-insulin antidiabetic drugs: Secondary | ICD-10-CM | POA: Diagnosis not present

## 2024-04-11 DIAGNOSIS — Z794 Long term (current) use of insulin: Secondary | ICD-10-CM | POA: Diagnosis not present

## 2024-04-11 LAB — BAYER DCA HB A1C WAIVED: HB A1C (BAYER DCA - WAIVED): 7 % — ABNORMAL HIGH (ref 4.8–5.6)

## 2024-04-11 NOTE — Progress Notes (Signed)
 Established Patient Office Visit  Subjective:  Patient ID: Rebecca Cook, female    DOB: December 14, 1955  Age: 68 y.o. MRN: [id number]  CC:  Chief Complaint  Patient presents with   Medical Management of Chronic Issues    HPI Rebecca Cook presents for chronic follow up.   DM Patient denies foot ulcerations, hypoglycemia , nausea, paresthesia of the feet, visual disturbances, vomiting, and weight loss.  Saw PharmD last month, decreased trulicity  to 3 mg due to significant side effects. Decreased humalog  due to hypoglycemia events.   Current diabetic medications include trulicity  3 mg, metformin  1000 mg BID,  (doesn't tolerate higher dosages) humalog  7 units 3x a day, and basaglar  14 units at night Compliant with meds - Yes  Current monitoring regimen: CGM Home blood sugar records: Time in target 66% for last 30 days, GMI 7.3 for last 30 days Any episodes of hypoglycemia?  no  Weight trend: stable Current diet: she has increased her protein intake- nuts, cottage cheese  Feeling better. Has more energy. Walking daily.   2. HTN Complaint with meds - Yes Current Medications - losartan -hydrochlorothiazide BP at home- 110s/80s Pertinent ROS:  Visual Disturbances - No Chest pain - No Dyspnea - No Palpitations - No LE edema - No  3. Neuropathy Taking gabapentin  TID with good relief.   4. Insomnia Taking trazodone  with improvement. Feels groggy initially but this doesn't last long. Depression/anxiety is well controlled.     04/11/2024   11:10 AM 01/10/2024   10:55 AM 10/12/2023    8:51 AM  Depression screen PHQ 2/9  Decreased Interest 0 0 0  Down, Depressed, Hopeless 0 0 0  PHQ - 2 Score 0 0 0  Altered sleeping 1 1 0  Tired, decreased energy 2 1 2   Change in appetite 0 0 0  Feeling bad or failure about yourself  0 0 0  Trouble concentrating 0 0 0  Moving slowly or fidgety/restless 0 0 0  Suicidal thoughts 0 0 0  PHQ-9 Score 3 2 2   Difficult doing work/chores Not  difficult at all Not difficult at all Not difficult at all      01/10/2024   10:55 AM 10/12/2023    8:52 AM 07/06/2023    3:46 PM 04/05/2023    2:25 PM  GAD 7 : Generalized Anxiety Score  Nervous, Anxious, on Edge 1 0 0 0  Control/stop worrying 0 0 0 0  Worry too much - different things 0 0 0 0  Trouble relaxing 0 0 0 0  Restless 0 0 0 0  Easily annoyed or irritable 0 0 0 0  Afraid - awful might happen 0 0 0 0  Total GAD 7 Score 1 0 0 0  Anxiety Difficulty Not difficult at all Not difficult at all Not difficult at all       Past Medical History:  Diagnosis Date   Allergy    Many years   Anxiety Recently   Looking into employer's EAP orogram for help   Arthritis This Spring   Right hand. Using a cream for pain relief   Cataract February 2022   Had cataracts removed from both eyes summer of 2022   Depression    Something I've experienced in my life but not often   Diabetes mellitus, type II (HCC)    Frequent headaches    GERD (gastroesophageal reflux disease)    Hypertension    Kidney stones    None current, last had  kidney stones in 1986   Neuromuscular disorder (HCC)    Taking Gabapentin     No Known Allergies  ROS Review of Systems As per HPI.    Objective:    Physical Exam Vitals and nursing note reviewed.  Constitutional:      General: She is not in acute distress.    Appearance: She is not ill-appearing, toxic-appearing or diaphoretic.  HENT:     Nose: Nose normal.     Mouth/Throat:     Mouth: Mucous membranes are moist.     Pharynx: Oropharynx is clear.  Cardiovascular:     Rate and Rhythm: Normal rate and regular rhythm.     Heart sounds: Normal heart sounds. No murmur heard. Pulmonary:     Effort: Pulmonary effort is normal. No respiratory distress.     Breath sounds: Normal breath sounds.  Abdominal:     General: Bowel sounds are normal. There is no distension.     Palpations: Abdomen is soft.     Tenderness: There is no abdominal tenderness.  There is no guarding or rebound.  Musculoskeletal:     Right lower leg: No edema.     Left lower leg: No edema.  Skin:    General: Skin is warm and dry.  Neurological:     General: No focal deficit present.     Mental Status: She is alert and oriented to person, place, and time.  Psychiatric:        Mood and Affect: Mood normal.        Behavior: Behavior normal.     BP 139/85   Pulse 79   Temp 98.3 F (36.8 C) (Temporal)   Ht 5' 6 (1.676 m)   Wt 196 lb 6.4 oz (89.1 kg)   SpO2 98%   BMI 31.70 kg/m  BP Readings from Last 3 Encounters:  04/11/24 139/85  01/10/24 125/88  10/12/23 121/78    Wt Readings from Last 3 Encounters:  04/11/24 196 lb 6.4 oz (89.1 kg)  01/10/24 196 lb (88.9 kg)  10/12/23 192 lb (87.1 kg)     There are no preventive care reminders to display for this patient.   There are no preventive care reminders to display for this patient.  Lab Results  Component Value Date   TSH 2.140 10/12/2023   Lab Results  Component Value Date   WBC 6.7 01/10/2024   HGB 13.9 01/10/2024   HCT 41.9 01/10/2024   MCV 84 01/10/2024   PLT 214 01/10/2024   Lab Results  Component Value Date   NA 143 01/10/2024   K 4.3 01/10/2024   CO2 25 01/10/2024   GLUCOSE 122 (H) 01/10/2024   BUN 15 01/10/2024   CREATININE 0.75 01/10/2024   BILITOT 0.5 01/10/2024   ALKPHOS 66 01/10/2024   AST 14 01/10/2024   ALT 13 01/10/2024   PROT 6.8 01/10/2024   ALBUMIN 4.4 01/10/2024   CALCIUM  9.6 01/10/2024   ANIONGAP 12 02/19/2020   EGFR 86 01/10/2024   GFR 94.70 03/02/2020   Lab Results  Component Value Date   CHOL 111 01/10/2024   Lab Results  Component Value Date   HDL 45 01/10/2024   Lab Results  Component Value Date   LDLCALC 44 01/10/2024   Lab Results  Component Value Date   TRIG 125 01/10/2024   Lab Results  Component Value Date   CHOLHDL 2.5 01/10/2024   Lab Results  Component Value Date   HGBA1C 7.1 (H) 01/10/2024  Assessment & Plan:    Rebecca Cook was seen today for medical management of chronic issues.  Diagnoses and all orders for this visit:  Type 2 diabetes mellitus with diabetic neuropathy, with long-term current use of insulin  (HCC) A1c 7.0, not quite at goal at <7. Continue current regimen. On ARB and statin.  -     Bayer DCA Hb A1c Waived  Long-term (current) use of injectable non-insulin  antidiabetic drugs  Long term (current) use of oral hypoglycemic drugs  Hypertension associated with diabetes (HCC) Home BPs at goal.   Hyperlipidemia associated with type 2 diabetes mellitus (HCC) Well controlled. On statin.   Insomnia due to medical condition Continue trazodone .   Class 1 obesity due to excess calories with serious comorbidity and body mass index (BMI) of 31.0 to 31.9 in adult Diet, exercise, weight loss.    Follow-up: Return in about 3 months (around 07/12/2024) for chronic follow up.  The patient indicates understanding of these issues and agrees with the plan.    [name], FNP

## 2024-04-25 DIAGNOSIS — N841 Polyp of cervix uteri: Secondary | ICD-10-CM | POA: Diagnosis not present

## 2024-05-16 DIAGNOSIS — Z1231 Encounter for screening mammogram for malignant neoplasm of breast: Secondary | ICD-10-CM | POA: Diagnosis not present

## 2024-05-16 LAB — HM MAMMOGRAPHY

## 2024-05-17 ENCOUNTER — Encounter: Payer: Self-pay | Admitting: Family Medicine

## 2024-07-14 ENCOUNTER — Other Ambulatory Visit: Payer: Self-pay | Admitting: Family Medicine

## 2024-07-14 DIAGNOSIS — G4701 Insomnia due to medical condition: Secondary | ICD-10-CM

## 2024-07-17 ENCOUNTER — Ambulatory Visit: Admitting: Family Medicine

## 2024-07-17 ENCOUNTER — Encounter: Payer: Self-pay | Admitting: Family Medicine

## 2024-07-17 VITALS — BP 130/73 | HR 83 | Temp 98.2°F | Ht 66.0 in | Wt 199.8 lb

## 2024-07-17 DIAGNOSIS — E66811 Obesity, class 1: Secondary | ICD-10-CM | POA: Diagnosis not present

## 2024-07-17 DIAGNOSIS — G4701 Insomnia due to medical condition: Secondary | ICD-10-CM | POA: Diagnosis not present

## 2024-07-17 DIAGNOSIS — Z6832 Body mass index (BMI) 32.0-32.9, adult: Secondary | ICD-10-CM

## 2024-07-17 DIAGNOSIS — Z794 Long term (current) use of insulin: Secondary | ICD-10-CM

## 2024-07-17 DIAGNOSIS — Z23 Encounter for immunization: Secondary | ICD-10-CM | POA: Diagnosis not present

## 2024-07-17 DIAGNOSIS — I152 Hypertension secondary to endocrine disorders: Secondary | ICD-10-CM

## 2024-07-17 DIAGNOSIS — E1159 Type 2 diabetes mellitus with other circulatory complications: Secondary | ICD-10-CM

## 2024-07-17 DIAGNOSIS — E1169 Type 2 diabetes mellitus with other specified complication: Secondary | ICD-10-CM | POA: Diagnosis not present

## 2024-07-17 DIAGNOSIS — Z7984 Long term (current) use of oral hypoglycemic drugs: Secondary | ICD-10-CM | POA: Diagnosis not present

## 2024-07-17 DIAGNOSIS — Z7985 Long-term (current) use of injectable non-insulin antidiabetic drugs: Secondary | ICD-10-CM | POA: Diagnosis not present

## 2024-07-17 DIAGNOSIS — E785 Hyperlipidemia, unspecified: Secondary | ICD-10-CM | POA: Diagnosis not present

## 2024-07-17 DIAGNOSIS — E1140 Type 2 diabetes mellitus with diabetic neuropathy, unspecified: Secondary | ICD-10-CM

## 2024-07-17 LAB — BAYER DCA HB A1C WAIVED: HB A1C (BAYER DCA - WAIVED): 7 % — ABNORMAL HIGH (ref 4.8–5.6)

## 2024-07-17 NOTE — Progress Notes (Signed)
 Established Patient Office Visit  Subjective   Patient ID: Rebecca Cook, female    DOB: 05/31/1956  Age: 68 y.o. MRN: [id number]  Chief Complaint  Patient presents with   Medical Management of Chronic Issues    HPI  History of Present Illness   Rebecca Cook is a 68 year old female with type 2 diabetes who presents for routine follow-up.  Glycemic control - Type 2 diabetes mellitus with slightly elevated blood glucose levels, particularly in the mornings and at night - Dietary modifications include cutting out sugar and focusing on complex carbohydrates - Increased morning insulin  dose from 7 to 8 units, resulting in perceived improvement in glycemic control - Current diabetes medications: Trulicity  3 mg weekly, Humalog , Basaglar , and metformin   Peripheral neuropathy - Gabapentin  used for neuropathic symptoms  Oral health and dietary habits - Recently started using Sure Smile aligners, restricting intake to water while wearing them - Increased water intake and improved eating habits due to aligner use  Physical activity - Limited formal exercise - Occupational activity includes significant walking at both jobs  Hypertension - Continues losartan  and hydrochlorothiazide (HCTZ) for blood pressure management  Hyperlipidemia - Continues Crestor  for cholesterol management - Most recent LDL was 44  Sleep disturbance - Uses trazodone  nightly for sleep with good effect  Preventive health screening - Participated in workplace health screening at [employer] with blood drawn for various tests - Plans to share results with healthcare provider         07/17/2024   11:18 AM 04/11/2024   11:10 AM 01/10/2024   10:55 AM  Depression screen PHQ 2/9  Decreased Interest 0 0 0  Down, Depressed, Hopeless 0 0 0  PHQ - 2 Score 0 0 0  Altered sleeping 1 1 1   Tired, decreased energy 2 2 1   Change in appetite 0 0 0  Feeling bad or failure about yourself  0 0 0  Trouble concentrating 0 0 0   Moving slowly or fidgety/restless 0 0 0  Suicidal thoughts 0 0 0  PHQ-9 Score 3 3 2   Difficult doing work/chores Not difficult at all Not difficult at all Not difficult at all      07/17/2024   11:19 AM 04/11/2024   11:10 AM 01/10/2024   10:55 AM 10/12/2023    8:52 AM  GAD 7 : Generalized Anxiety Score  Nervous, Anxious, on Edge 0 1 1 0  Control/stop worrying 0 1 0 0  Worry too much - different things 0 1 0 0  Trouble relaxing 0 0 0 0  Restless 0 0 0 0  Easily annoyed or irritable 1 1 0 0  Afraid - awful might happen 0 0 0 0  Total GAD 7 Score 1 4 1  0  Anxiety Difficulty Not difficult at all Not difficult at all Not difficult at all Not difficult at all        ROS As per HPI.    Objective:     BP 130/73   Pulse 83   Temp 98.2 F (36.8 C) (Temporal)   Ht 5' 6 (1.676 m)   Wt 199 lb 12.8 oz (90.6 kg)   SpO2 97%   BMI 32.25 kg/m  Wt Readings from Last 3 Encounters:  07/17/24 199 lb 12.8 oz (90.6 kg)  04/11/24 196 lb 6.4 oz (89.1 kg)  01/10/24 196 lb (88.9 kg)      Physical Exam Vitals and nursing note reviewed.  Constitutional:  General: She is not in acute distress.    Appearance: She is not ill-appearing, toxic-appearing or diaphoretic.  HENT:     Nose: Nose normal.     Mouth/Throat:     Mouth: Mucous membranes are moist.     Pharynx: Oropharynx is clear.  Cardiovascular:     Rate and Rhythm: Normal rate and regular rhythm.     Heart sounds: Normal heart sounds. No murmur heard. Pulmonary:     Effort: Pulmonary effort is normal. No respiratory distress.     Breath sounds: Normal breath sounds.  Abdominal:     General: Bowel sounds are normal. There is no distension.     Palpations: Abdomen is soft.     Tenderness: There is no abdominal tenderness. There is no guarding or rebound.  Musculoskeletal:     Right lower leg: No edema.     Left lower leg: No edema.  Skin:    General: Skin is warm and dry.  Neurological:     General: No focal  deficit present.     Mental Status: She is alert and oriented to person, place, and time.  Psychiatric:        Mood and Affect: Mood normal.        Behavior: Behavior normal.      No results found for any visits on 07/17/24.    The ASCVD Risk score (Arnett DK, et al., 2019) failed to calculate for the following reasons:   The valid total cholesterol range is 130 to 320 mg/dL    Assessment & Plan:   Rebecca Cook was seen today for medical management of chronic issues.  Diagnoses and all orders for this visit:  Type 2 diabetes mellitus with diabetic neuropathy, with long-term current use of insulin  (HCC) -     Bayer DCA Hb A1c Waived  Long-term (current) use of injectable non-insulin  antidiabetic drugs  Long term (current) use of oral hypoglycemic drugs  Hypertension associated with diabetes (HCC)  Hyperlipidemia associated with type 2 diabetes mellitus (HCC)  Class 1 obesity due to excess calories with serious comorbidity and body mass index (BMI) of 32.0 to 32.9 in adult  Insomnia due to medical condition  Encounter for immunization -     Flu vaccine HIGH DOSE PF(Fluzone Trivalent)   Assessment and Plan    Type 2 diabetes mellitus with neuropathy and circulatory complications Diabetes managed with medications; A1c not quite at target of <7. Neuropathy stable with gabapentin . Educated on condition and insulin  adjustments. - Continue Trulicity  3 mg weekly. - Continue Humalog  and Basaglar  as per current regimen. - Continue metformin . - Continue gabapentin  for neuropathy. - Encourage dietary management focusing on good carbohydrates. - Allow minor titration of insulin  as needed with caution. - Review blood work results from [facility] when available.  HTN Well controlled.   HLD Well controlled. Continue statin. Insomnia due to medical condition Insomnia effectively managed with trazodone . - Continue trazodone  nightly for sleep.     Obesity Diet, exercise, weight loss.    Insomnia Continue trazodone .    Return in about 3 months (around 10/17/2024) for chronic follow up.   The patient indicates understanding of these issues and agrees with the plan.  Rebecca CHRISTELLA Search, FNP

## 2024-08-13 ENCOUNTER — Ambulatory Visit: Payer: Self-pay

## 2024-09-10 ENCOUNTER — Telehealth: Payer: Self-pay

## 2024-09-10 NOTE — Telephone Encounter (Signed)
 Enrolled with [employer] patient assistance until 09/25/24 for Humalog , Basaglar , & Trulicity .

## 2024-10-03 ENCOUNTER — Ambulatory Visit

## 2024-10-07 ENCOUNTER — Other Ambulatory Visit: Payer: Self-pay | Admitting: Nurse Practitioner

## 2024-10-07 DIAGNOSIS — G4701 Insomnia due to medical condition: Secondary | ICD-10-CM

## 2024-10-10 ENCOUNTER — Telehealth: Payer: Self-pay | Admitting: Pharmacist

## 2024-10-10 NOTE — Telephone Encounter (Signed)
 Can we check on enrollment PAP [employer]? Just cleaning out my inbox and enrolling patients in the new PAP program tracker we have in [employer]. I can send eRX to [place] when ready.  Margan Elias Dattero Jacere Pangborn, PharmD, BCACP, CPP Clinical Pharmacist, [facility]

## 2024-10-11 ENCOUNTER — Other Ambulatory Visit: Payer: Self-pay | Admitting: Family Medicine

## 2024-10-11 DIAGNOSIS — E1140 Type 2 diabetes mellitus with diabetic neuropathy, unspecified: Secondary | ICD-10-CM

## 2024-10-11 DIAGNOSIS — E1169 Type 2 diabetes mellitus with other specified complication: Secondary | ICD-10-CM

## 2024-10-16 ENCOUNTER — Encounter: Payer: Self-pay | Admitting: Family Medicine

## 2024-10-16 ENCOUNTER — Ambulatory Visit: Admitting: Family Medicine

## 2024-10-16 VITALS — BP 119/72 | HR 86 | Temp 98.1°F | Ht 66.0 in | Wt 198.8 lb

## 2024-10-16 DIAGNOSIS — E1159 Type 2 diabetes mellitus with other circulatory complications: Secondary | ICD-10-CM

## 2024-10-16 DIAGNOSIS — E785 Hyperlipidemia, unspecified: Secondary | ICD-10-CM | POA: Diagnosis not present

## 2024-10-16 DIAGNOSIS — Z6832 Body mass index (BMI) 32.0-32.9, adult: Secondary | ICD-10-CM

## 2024-10-16 DIAGNOSIS — E1140 Type 2 diabetes mellitus with diabetic neuropathy, unspecified: Secondary | ICD-10-CM | POA: Diagnosis not present

## 2024-10-16 DIAGNOSIS — E1169 Type 2 diabetes mellitus with other specified complication: Secondary | ICD-10-CM

## 2024-10-16 DIAGNOSIS — E6609 Other obesity due to excess calories: Secondary | ICD-10-CM

## 2024-10-16 DIAGNOSIS — I152 Hypertension secondary to endocrine disorders: Secondary | ICD-10-CM

## 2024-10-16 DIAGNOSIS — E66811 Obesity, class 1: Secondary | ICD-10-CM | POA: Diagnosis not present

## 2024-10-16 DIAGNOSIS — Z794 Long term (current) use of insulin: Secondary | ICD-10-CM

## 2024-10-16 DIAGNOSIS — E08319 Diabetes mellitus due to underlying condition with unspecified diabetic retinopathy without macular edema: Secondary | ICD-10-CM

## 2024-10-16 LAB — BAYER DCA HB A1C WAIVED: HB A1C (BAYER DCA - WAIVED): 7.5 % — ABNORMAL HIGH (ref 4.8–5.6)

## 2024-10-16 LAB — LIPID PANEL

## 2024-10-16 NOTE — Progress Notes (Unsigned)
" ° °  Established Patient Office Visit  Subjective   Patient ID: Rebecca Cook, female    DOB: [birth year]  Age: 69 y.o. MRN: [id number]  Chief Complaint  Patient presents with   Medical Management of Chronic Issues    HPI  {History (Optional):23778}  ROS    Objective:     BP 119/72   Pulse 86   Temp 98.1 F (36.7 C)   Ht 5' 6 (1.676 m)   Wt 198 lb 12.8 oz (90.2 kg)   SpO2 97%   BMI 32.09 kg/m  Wt Readings from Last 3 Encounters:  10/16/24 198 lb 12.8 oz (90.2 kg)  07/17/24 199 lb 12.8 oz (90.6 kg)  04/11/24 196 lb 6.4 oz (89.1 kg)      Physical Exam   No results found for any visits on 10/16/24.  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    Rebecca CHRISTELLA Search, FNP "

## 2024-10-17 ENCOUNTER — Ambulatory Visit: Payer: Self-pay | Admitting: Family Medicine

## 2024-10-17 LAB — CMP14+EGFR
ALT: 15 IU/L (ref 0–32)
AST: 18 IU/L (ref 0–40)
Albumin: 4.2 g/dL (ref 3.9–4.9)
Alkaline Phosphatase: 72 IU/L (ref 49–135)
BUN/Creatinine Ratio: 22 (ref 12–28)
BUN: 18 mg/dL (ref 8–27)
Bilirubin Total: 0.6 mg/dL (ref 0.0–1.2)
CO2: 25 mmol/L (ref 20–29)
Calcium: 10 mg/dL (ref 8.7–10.3)
Chloride: 99 mmol/L (ref 96–106)
Creatinine, Ser: 0.81 mg/dL (ref 0.57–1.00)
Globulin, Total: 2.6 g/dL (ref 1.5–4.5)
Glucose: 147 mg/dL — AB (ref 70–99)
Potassium: 4.6 mmol/L (ref 3.5–5.2)
Sodium: 138 mmol/L (ref 134–144)
Total Protein: 6.8 g/dL (ref 6.0–8.5)
eGFR: 78 mL/min/1.73

## 2024-10-17 LAB — CBC WITH DIFFERENTIAL/PLATELET
Basophils Absolute: 0.1 x10E3/uL (ref 0.0–0.2)
Basos: 1 %
EOS (ABSOLUTE): 0.3 x10E3/uL (ref 0.0–0.4)
Eos: 3 %
Hematocrit: 43.4 % (ref 34.0–46.6)
Hemoglobin: 14.4 g/dL (ref 11.1–15.9)
Immature Grans (Abs): 0 x10E3/uL (ref 0.0–0.1)
Immature Granulocytes: 0 %
Lymphocytes Absolute: 2.1 x10E3/uL (ref 0.7–3.1)
Lymphs: 26 %
MCH: 27.9 pg (ref 26.6–33.0)
MCHC: 33.2 g/dL (ref 31.5–35.7)
MCV: 84 fL (ref 79–97)
Monocytes Absolute: 0.7 x10E3/uL (ref 0.1–0.9)
Monocytes: 8 %
Neutrophils Absolute: 4.9 x10E3/uL (ref 1.4–7.0)
Neutrophils: 62 %
Platelets: 225 x10E3/uL (ref 150–450)
RBC: 5.17 x10E6/uL (ref 3.77–5.28)
RDW: 13.4 % (ref 11.7–15.4)
WBC: 8 x10E3/uL (ref 3.4–10.8)

## 2024-10-17 LAB — VITAMIN B12: Vitamin B-12: 530 pg/mL (ref 232–1245)

## 2024-10-17 LAB — LIPID PANEL
Cholesterol, Total: 130 mg/dL (ref 100–199)
HDL: 40 mg/dL
LDL CALC COMMENT:: 3.3 ratio (ref 0.0–4.4)
LDL Chol Calc (NIH): 57 mg/dL (ref 0–99)
Triglycerides: 203 mg/dL — AB (ref 0–149)
VLDL Cholesterol Cal: 33 mg/dL (ref 5–40)

## 2024-10-17 LAB — MICROALBUMIN / CREATININE URINE RATIO
Creatinine, Urine: 183.9 mg/dL
Microalb/Creat Ratio: 9 mg/g{creat} (ref 0–29)
Microalbumin, Urine: 16.4 ug/mL

## 2024-10-17 LAB — TSH: TSH: 0.899 u[IU]/mL (ref 0.450–4.500)

## 2024-10-23 ENCOUNTER — Ambulatory Visit: Payer: Self-pay | Admitting: Family Medicine

## 2024-11-01 NOTE — Progress Notes (Signed)
 Rebecca Cook                                          MRN: [id number]   11/01/2024   The [facility] Quality Team Specialist reviewed this patient medical record for the purposes of chart review for care gap closure. The following were reviewed: chart review for care gap closure-kidney health evaluation for diabetes:eGFR .    [facility]BCI Quality Team

## 2025-01-15 ENCOUNTER — Ambulatory Visit: Admitting: Family Medicine
